# Patient Record
Sex: Female | Born: 1965 | Race: Black or African American | Hispanic: No | Smoking: Never smoker
Health system: Southern US, Community
[De-identification: ages and names within clinical notes are randomized; demographics above are authoritative.]

## PROBLEM LIST (undated history)

## (undated) ENCOUNTER — Ambulatory Visit: Payer: PRIVATE HEALTH INSURANCE

## (undated) ENCOUNTER — Encounter

## (undated) ENCOUNTER — Encounter
Attending: Student in an Organized Health Care Education/Training Program | Primary: Student in an Organized Health Care Education/Training Program

## (undated) ENCOUNTER — Ambulatory Visit

## (undated) ENCOUNTER — Ambulatory Visit: Attending: Pharmacist | Primary: Pharmacist

## (undated) ENCOUNTER — Ambulatory Visit
Payer: PRIVATE HEALTH INSURANCE | Attending: Student in an Organized Health Care Education/Training Program | Primary: Student in an Organized Health Care Education/Training Program

## (undated) ENCOUNTER — Encounter: Attending: Pharmacist | Primary: Pharmacist

## (undated) ENCOUNTER — Telehealth

## (undated) ENCOUNTER — Encounter: Attending: Ambulatory Care | Primary: Ambulatory Care

## (undated) ENCOUNTER — Ambulatory Visit
Attending: Student in an Organized Health Care Education/Training Program | Primary: Student in an Organized Health Care Education/Training Program

## (undated) ENCOUNTER — Telehealth: Attending: Ambulatory Care | Primary: Ambulatory Care

## (undated) DIAGNOSIS — H409 Unspecified glaucoma: Secondary | ICD-10-CM

## (undated) DIAGNOSIS — C50919 Malignant neoplasm of unspecified site of unspecified female breast: Secondary | ICD-10-CM

## (undated) DIAGNOSIS — R350 Frequency of micturition: Secondary | ICD-10-CM

## (undated) DIAGNOSIS — Z923 Personal history of irradiation: Secondary | ICD-10-CM

## (undated) DIAGNOSIS — N898 Other specified noninflammatory disorders of vagina: Secondary | ICD-10-CM

## (undated) DIAGNOSIS — K219 Gastro-esophageal reflux disease without esophagitis: Secondary | ICD-10-CM

## (undated) HISTORY — DX: Other specified noninflammatory disorders of vagina: N89.8

## (undated) HISTORY — DX: Frequency of micturition: R35.0

## (undated) HISTORY — PX: EYE SURGERY: SHX253

## (undated) HISTORY — DX: Unspecified glaucoma: H40.9

## (undated) HISTORY — DX: Gastro-esophageal reflux disease without esophagitis: K21.9

## (undated) HISTORY — PX: BREAST LUMPECTOMY: SHX2

---
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
## 2005-09-05 ENCOUNTER — Ambulatory Visit: Payer: Self-pay

## 2007-09-04 HISTORY — PX: VAGINAL HYSTERECTOMY: SUR661

## 2007-09-04 HISTORY — PX: LAPAROSCOPIC SUPRACERVICAL HYSTERECTOMY: SUR797

## 2007-11-12 ENCOUNTER — Ambulatory Visit: Payer: Self-pay | Admitting: Obstetrics and Gynecology

## 2007-11-24 ENCOUNTER — Ambulatory Visit: Payer: Self-pay | Admitting: Obstetrics and Gynecology

## 2008-09-03 DIAGNOSIS — Z923 Personal history of irradiation: Secondary | ICD-10-CM

## 2008-09-03 DIAGNOSIS — C50919 Malignant neoplasm of unspecified site of unspecified female breast: Secondary | ICD-10-CM

## 2008-09-03 HISTORY — PX: BREAST EXCISIONAL BIOPSY: SUR124

## 2008-09-03 HISTORY — PX: BREAST BIOPSY: SHX20

## 2008-09-03 HISTORY — DX: Malignant neoplasm of unspecified site of unspecified female breast: C50.919

## 2008-09-03 HISTORY — PX: BREAST LUMPECTOMY: SHX2

## 2008-09-03 HISTORY — DX: Personal history of irradiation: Z92.3

## 2008-12-15 ENCOUNTER — Ambulatory Visit: Payer: Self-pay | Admitting: Surgery

## 2008-12-22 ENCOUNTER — Ambulatory Visit: Payer: Self-pay | Admitting: Surgery

## 2009-05-18 ENCOUNTER — Ambulatory Visit: Payer: Self-pay | Admitting: Surgery

## 2009-06-03 ENCOUNTER — Ambulatory Visit: Payer: Self-pay | Admitting: Surgery

## 2009-06-21 ENCOUNTER — Ambulatory Visit: Payer: Self-pay | Admitting: Surgery

## 2009-07-04 ENCOUNTER — Ambulatory Visit: Payer: Self-pay | Admitting: Radiation Oncology

## 2009-07-05 ENCOUNTER — Ambulatory Visit: Payer: Self-pay | Admitting: Radiation Oncology

## 2009-08-03 ENCOUNTER — Ambulatory Visit: Payer: Self-pay | Admitting: Radiation Oncology

## 2009-09-03 ENCOUNTER — Ambulatory Visit: Payer: Self-pay | Admitting: Radiation Oncology

## 2009-10-04 ENCOUNTER — Ambulatory Visit: Payer: Self-pay | Admitting: Radiation Oncology

## 2009-11-01 ENCOUNTER — Ambulatory Visit: Payer: Self-pay | Admitting: Radiation Oncology

## 2009-12-02 ENCOUNTER — Ambulatory Visit: Payer: Self-pay | Admitting: Internal Medicine

## 2009-12-05 ENCOUNTER — Ambulatory Visit: Payer: Self-pay | Admitting: Internal Medicine

## 2009-12-06 ENCOUNTER — Ambulatory Visit: Payer: Self-pay | Admitting: Internal Medicine

## 2010-01-01 ENCOUNTER — Ambulatory Visit: Payer: Self-pay | Admitting: Internal Medicine

## 2010-03-03 ENCOUNTER — Ambulatory Visit: Payer: Self-pay | Admitting: Internal Medicine

## 2010-03-20 ENCOUNTER — Ambulatory Visit: Payer: Self-pay | Admitting: Internal Medicine

## 2010-04-03 ENCOUNTER — Ambulatory Visit: Payer: Self-pay | Admitting: Internal Medicine

## 2010-04-07 ENCOUNTER — Ambulatory Visit: Payer: Self-pay | Admitting: Internal Medicine

## 2010-05-04 ENCOUNTER — Ambulatory Visit: Payer: Self-pay | Admitting: Internal Medicine

## 2010-06-12 ENCOUNTER — Ambulatory Visit: Payer: Self-pay | Admitting: Internal Medicine

## 2010-08-08 ENCOUNTER — Ambulatory Visit: Payer: Self-pay | Admitting: Internal Medicine

## 2010-09-03 ENCOUNTER — Ambulatory Visit: Payer: Self-pay | Admitting: Internal Medicine

## 2010-10-04 ENCOUNTER — Ambulatory Visit: Payer: Self-pay | Admitting: Internal Medicine

## 2010-11-17 DIAGNOSIS — H43819 Vitreous degeneration, unspecified eye: Secondary | ICD-10-CM | POA: Insufficient documentation

## 2010-11-17 DIAGNOSIS — H40049 Steroid responder, unspecified eye: Secondary | ICD-10-CM | POA: Insufficient documentation

## 2010-11-17 DIAGNOSIS — H35069 Retinal vasculitis, unspecified eye: Secondary | ICD-10-CM | POA: Insufficient documentation

## 2010-11-17 DIAGNOSIS — H3090 Unspecified chorioretinal inflammation, unspecified eye: Secondary | ICD-10-CM | POA: Insufficient documentation

## 2010-11-17 DIAGNOSIS — H2510 Age-related nuclear cataract, unspecified eye: Secondary | ICD-10-CM | POA: Insufficient documentation

## 2010-12-12 ENCOUNTER — Ambulatory Visit: Payer: Self-pay | Admitting: Internal Medicine

## 2011-01-02 ENCOUNTER — Ambulatory Visit: Payer: Self-pay | Admitting: Internal Medicine

## 2011-02-14 ENCOUNTER — Emergency Department: Payer: Self-pay

## 2011-06-13 ENCOUNTER — Ambulatory Visit: Payer: Self-pay | Admitting: Internal Medicine

## 2011-06-19 ENCOUNTER — Ambulatory Visit: Payer: Self-pay | Admitting: Internal Medicine

## 2011-07-05 ENCOUNTER — Ambulatory Visit: Payer: Self-pay | Admitting: Internal Medicine

## 2011-09-24 ENCOUNTER — Ambulatory Visit: Payer: Self-pay | Admitting: Internal Medicine

## 2011-10-05 ENCOUNTER — Ambulatory Visit: Payer: Self-pay | Admitting: Internal Medicine

## 2011-12-12 ENCOUNTER — Ambulatory Visit: Payer: Self-pay | Admitting: Internal Medicine

## 2012-01-02 ENCOUNTER — Ambulatory Visit: Payer: Self-pay | Admitting: Internal Medicine

## 2012-06-19 ENCOUNTER — Ambulatory Visit: Payer: Self-pay | Admitting: Internal Medicine

## 2012-07-25 ENCOUNTER — Ambulatory Visit: Payer: Self-pay | Admitting: Internal Medicine

## 2012-08-03 ENCOUNTER — Ambulatory Visit: Payer: Self-pay | Admitting: Internal Medicine

## 2012-09-03 ENCOUNTER — Ambulatory Visit: Payer: Self-pay | Admitting: Radiation Oncology

## 2012-10-04 ENCOUNTER — Ambulatory Visit: Payer: Self-pay | Admitting: Radiation Oncology

## 2013-06-22 ENCOUNTER — Ambulatory Visit: Payer: Self-pay | Admitting: Internal Medicine

## 2013-07-22 ENCOUNTER — Ambulatory Visit: Payer: Self-pay | Admitting: Internal Medicine

## 2013-08-03 ENCOUNTER — Ambulatory Visit: Payer: Self-pay | Admitting: Internal Medicine

## 2013-09-29 ENCOUNTER — Ambulatory Visit: Payer: Self-pay | Admitting: Internal Medicine

## 2013-10-04 ENCOUNTER — Ambulatory Visit: Payer: Self-pay | Admitting: Internal Medicine

## 2013-11-16 LAB — HM PAP SMEAR: HM PAP: NEGATIVE

## 2014-06-23 ENCOUNTER — Ambulatory Visit: Payer: Self-pay | Admitting: Internal Medicine

## 2014-09-29 ENCOUNTER — Ambulatory Visit: Payer: Self-pay | Admitting: Internal Medicine

## 2014-10-11 ENCOUNTER — Ambulatory Visit: Payer: Self-pay | Admitting: Internal Medicine

## 2014-11-02 ENCOUNTER — Ambulatory Visit
Admit: 2014-11-02 | Disposition: A | Discharge status: Home / Self Care | Payer: Self-pay | Attending: Internal Medicine | Admitting: Internal Medicine

## 2014-12-17 ENCOUNTER — Other Ambulatory Visit: Payer: Self-pay | Admitting: Internal Medicine

## 2014-12-17 DIAGNOSIS — C50919 Malignant neoplasm of unspecified site of unspecified female breast: Secondary | ICD-10-CM

## 2014-12-17 DIAGNOSIS — C50911 Malignant neoplasm of unspecified site of right female breast: Secondary | ICD-10-CM

## 2015-06-27 ENCOUNTER — Other Ambulatory Visit: Payer: Self-pay

## 2015-06-28 ENCOUNTER — Encounter (INDEPENDENT_AMBULATORY_CARE_PROVIDER_SITE_OTHER): Payer: Self-pay

## 2015-06-28 ENCOUNTER — Ambulatory Visit: Payer: 59

## 2015-06-28 ENCOUNTER — Ambulatory Visit
Admission: RE | Admit: 2015-06-28 | Discharge: 2015-06-28 | Disposition: A | Discharge status: Home / Self Care | Payer: 59 | Source: Ambulatory Visit | Attending: Internal Medicine | Admitting: Internal Medicine

## 2015-06-28 DIAGNOSIS — Z9889 Other specified postprocedural states: Secondary | ICD-10-CM | POA: Diagnosis not present

## 2015-06-28 DIAGNOSIS — C50919 Malignant neoplasm of unspecified site of unspecified female breast: Secondary | ICD-10-CM

## 2015-06-28 DIAGNOSIS — Z923 Personal history of irradiation: Secondary | ICD-10-CM | POA: Diagnosis not present

## 2015-06-28 DIAGNOSIS — Z853 Personal history of malignant neoplasm of breast: Secondary | ICD-10-CM | POA: Insufficient documentation

## 2015-06-28 HISTORY — DX: Malignant neoplasm of unspecified site of unspecified female breast: C50.919

## 2016-01-18 ENCOUNTER — Encounter: Payer: Self-pay | Admitting: Obstetrics and Gynecology

## 2016-02-16 ENCOUNTER — Encounter: Payer: Self-pay | Admitting: Obstetrics and Gynecology

## 2016-03-07 ENCOUNTER — Encounter: Payer: Self-pay | Admitting: Obstetrics and Gynecology

## 2016-04-10 ENCOUNTER — Encounter: Payer: Self-pay | Admitting: Obstetrics and Gynecology

## 2016-04-12 NOTE — Progress Notes (Signed)
ANNUAL PREVENTATIVE CARE GYN  ENCOUNTER NOTE  Subjective:       Victoria Greene is a 50 y.o. G61P2001 female here for a routine annual gynecologic exam.  Current complaints: 1.  None  Patient is not taking Evista. She is taking calcium with vitamin D Patient reports normal bowel and bladder function.   Gynecologic History No LMP recorded. Contraception: status post hysterectomy Last Pap: 11/16/2013 neg/neg. Results were: normal Last mammogram: 06/2015 birad 2. Results were: normal  Obstetric History OB History  Gravida Para Term Preterm AB Living  2 2 2     1   SAB TAB Ectopic Multiple Live Births          1    # Outcome Date GA Lbr Len/2nd Weight Sex Delivery Anes PTL Lv  2 Term 1998   9 lb (4.082 kg) M CS-LTranv   LIV  1 Term 1991   7 lb 2.6 oz (3.248 kg) M CS-LTranv         Past Medical History:  Diagnosis Date  . Breast cancer ([place]) 2010   Right breast cancer - radiation  . Glaucoma   . Urinary frequency   . Vaginal discharge     Past Surgical History:  Procedure Laterality Date  . BREAST BIOPSY Left 2010   stereotactic  . BREAST LUMPECTOMY Right 2010  . BREAST LUMPECTOMY    . CESAREAN SECTION    . VAGINAL HYSTERECTOMY  2009   lsh    No current outpatient prescriptions on file prior to visit.   No current facility-administered medications on file prior to visit.     Allergies  Allergen Reactions  . Shellfish-Derived Products Rash    Social History   Social History  . Marital status: Divorced    Spouse name: N/A  . Number of children: N/A  . Years of education: N/A   Occupational History  . Not on file.   Social History Main Topics  . Smoking status: Never Smoker  . Smokeless tobacco: Not on file  . Alcohol use Yes     Comment: occas  . Drug use: No  . Sexual activity: Yes    Birth control/ protection: Condom   Other Topics Concern  . Not on file   Social History Narrative  . No narrative on file    Family History  Problem Relation  Age of Onset  . Breast cancer Maternal Aunt 38  . Breast cancer Maternal Aunt 88  . Heart disease Father   . Ovarian cancer Neg Hx   . Colon cancer Neg Hx   . Diabetes Neg Hx     The following portions of the patient's history were reviewed and updated as appropriate: allergies, current medications, past family history, past medical history, past social history, past surgical history and problem list.  Review of Systems ROS Review of Systems - General ROS: negative for - chills, fatigue, fever, hot flashes, night sweats, weight gain or weight loss Psychological ROS: negative for - anxiety, decreased libido, depression, mood swings, physical abuse or sexual abuse Ophthalmic ROS: negative for - blurry vision, eye pain or loss of vision ENT ROS: negative for - headaches, hearing change, visual changes or vocal changes Allergy and Immunology ROS: negative for - hives, itchy/watery eyes or seasonal allergies Hematological and Lymphatic ROS: negative for - bleeding problems, bruising, swollen lymph nodes or weight loss Endocrine ROS: negative for - galactorrhea, hair pattern changes, hot flashes, malaise/lethargy, mood swings, palpitations, polydipsia/polyuria, skin changes, temperature intolerance  or unexpected weight changes Breast ROS: negative for - new or changing breast lumps or nipple discharge Respiratory ROS: negative for - cough or shortness of breath Cardiovascular ROS: negative for - chest pain, irregular heartbeat, palpitations or shortness of breath Gastrointestinal ROS: no abdominal pain, change in bowel habits, or black or bloody stools Genito-Urinary ROS: no dysuria, trouble voiding, or hematuria Musculoskeletal ROS: negative for - joint pain or joint stiffness Neurological ROS: negative for - bowel and bladder control changes Dermatological ROS: negative for rash and skin lesion changes   Objective:   There were no vitals taken for this visit. CONSTITUTIONAL:  Well-developed, well-nourished female in no acute distress.  PSYCHIATRIC: Normal mood and affect. Normal behavior. Normal judgment and thought content. [place]: Alert and oriented to person, place, and time. Normal muscle tone coordination. No cranial nerve deficit noted. HENT:  Normocephalic, atraumatic, External right and left ear normal. Oropharynx is clear and moist EYES: Conjunctivae and EOM are normal. Pupils are equal, round, and reactive to light. No scleral icterus.  NECK: Normal range of motion, supple, no masses.  Normal thyroid.  SKIN: Skin is warm and dry. No rash noted. Not diaphoretic. No erythema. No pallor. CARDIOVASCULAR: Normal heart rate noted, regular rhythm, no murmur. RESPIRATORY: Clear to auscultation bilaterally. Effort and breath sounds normal, no problems with respiration noted. BREASTS:  Right-scarring in the lower outer quadrant of the right areola from excisional biopsy of breast cancer; no palpable mass or significant tenderness; no adenopathy; no nipple discharge Left-curvilinear scar left outer quadrants approximately 5 cm well-healed (breast biopsy-microcalcifications); no palpable mass or significant tenderness; no adenopathy; no nipple discharge ABDOMEN: Soft, normal bowel sounds, no distention noted.  No tenderness, rebound or guarding. Midline incision well-healed without evidence of hernia BLADDER: Normal PELVIC:  External Genitalia: Normal  BUS: Normal  Vagina: Normal  Cervix: Normal; no cervical motion tenderness  Uterus: Surgically absent  Adnexa: Normal  RV: External Exam NormaI, No Rectal Masses and Normal Sphincter tone  MUSCULOSKELETAL: Normal range of motion. No tenderness.  No cyanosis, clubbing, or edema.  2+ distal pulses. LYMPHATIC: No Axillary, Supraclavicular, or Inguinal Adenopathy.    Assessment:   Annual gynecologic examination 50 y.o. Contraception: status post hysterectomy LSH Normal BMI-28 Problem List Items Addressed This  Visit    None    Visit Diagnoses    Well woman exam with routine gynecological exam    -  Primary   Screening for breast cancer       S/P hysterectomy       History of breast cancer          Plan:  Pap: due 11/2016 Mammogram: Ordered- needs dx mammo 06/2016 Stool Guaiac Testing:  Not Indicated Labs: most labs thru employer wants tsh Routine preventative health maintenance measures emphasized: Exercise/Diet/Weight control, Tobacco Warnings and Alcohol/Substance use risks  Continue with calcium and vitamin D supplementation Return to [place], CMA  Brayton Mars, MD  Note: This dictation was prepared with Dragon dictation along with smaller phrase technology. Any transcriptional errors that result from this process are unintentional.

## 2016-04-17 ENCOUNTER — Ambulatory Visit (INDEPENDENT_AMBULATORY_CARE_PROVIDER_SITE_OTHER): Payer: 59 | Admitting: Obstetrics and Gynecology

## 2016-04-17 ENCOUNTER — Encounter: Payer: Self-pay | Admitting: Obstetrics and Gynecology

## 2016-04-17 VITALS — BP 120/73 | HR 75 | Ht 60.0 in | Wt 146.9 lb

## 2016-04-17 DIAGNOSIS — E663 Overweight: Secondary | ICD-10-CM | POA: Insufficient documentation

## 2016-04-17 DIAGNOSIS — Z90710 Acquired absence of both cervix and uterus: Secondary | ICD-10-CM | POA: Diagnosis not present

## 2016-04-17 DIAGNOSIS — Z853 Personal history of malignant neoplasm of breast: Secondary | ICD-10-CM | POA: Diagnosis not present

## 2016-04-17 DIAGNOSIS — Z01419 Encounter for gynecological examination (general) (routine) without abnormal findings: Secondary | ICD-10-CM

## 2016-04-17 DIAGNOSIS — R5383 Other fatigue: Secondary | ICD-10-CM | POA: Diagnosis not present

## 2016-04-17 NOTE — Patient Instructions (Signed)
1. No Pap smear today. 2. Mammogram ordered 3. Encouraged self breast exam monthly 4. Continue with calcium and vitamin D supplementation 5. Continue with healthy eating and exercise with a weight loss goal of 10 pounds over 1 year 6. Return in 1 year for annual exam

## 2016-04-18 LAB — TSH: TSH: 1.44 u[IU]/mL (ref 0.450–4.500)

## 2016-06-28 ENCOUNTER — Ambulatory Visit
Admission: RE | Admit: 2016-06-28 | Discharge: 2016-06-28 | Disposition: A | Discharge status: Home / Self Care | Payer: 59 | Source: Ambulatory Visit | Attending: Obstetrics and Gynecology | Admitting: Obstetrics and Gynecology

## 2016-06-28 ENCOUNTER — Other Ambulatory Visit: Payer: Self-pay | Admitting: Obstetrics and Gynecology

## 2016-06-28 DIAGNOSIS — Z853 Personal history of malignant neoplasm of breast: Secondary | ICD-10-CM

## 2016-06-28 HISTORY — DX: Personal history of irradiation: Z92.3

## 2016-09-14 DIAGNOSIS — H35351 Cystoid macular degeneration, right eye: Secondary | ICD-10-CM | POA: Diagnosis not present

## 2016-09-14 DIAGNOSIS — H3091 Unspecified chorioretinal inflammation, right eye: Secondary | ICD-10-CM | POA: Diagnosis not present

## 2017-01-16 DIAGNOSIS — H409 Unspecified glaucoma: Secondary | ICD-10-CM | POA: Diagnosis not present

## 2017-01-16 DIAGNOSIS — H35351 Cystoid macular degeneration, right eye: Secondary | ICD-10-CM | POA: Diagnosis not present

## 2017-01-16 DIAGNOSIS — H3552 Pigmentary retinal dystrophy: Secondary | ICD-10-CM | POA: Diagnosis not present

## 2017-03-19 ENCOUNTER — Telehealth: Payer: Self-pay | Admitting: Obstetrics and Gynecology

## 2017-03-19 NOTE — Telephone Encounter (Signed)
Patient called stating we were supposed to schedule her for a mammogram, however she stated that she was released from the cancer center and only needs a screening mammogram in which case she could schedule it herself. Do you know what she needs?

## 2017-03-19 NOTE — Telephone Encounter (Signed)
Pt aware mammo is due 06/2017.

## 2017-03-19 NOTE — Telephone Encounter (Signed)
LMTRC

## 2017-03-20 DIAGNOSIS — J069 Acute upper respiratory infection, unspecified: Secondary | ICD-10-CM | POA: Diagnosis not present

## 2017-03-20 DIAGNOSIS — R05 Cough: Secondary | ICD-10-CM | POA: Diagnosis not present

## 2017-04-22 ENCOUNTER — Encounter: Payer: Self-pay | Admitting: Medical Oncology

## 2017-04-22 ENCOUNTER — Emergency Department
Admission: EM | Admit: 2017-04-22 | Discharge: 2017-04-22 | Disposition: A | Discharge status: Home / Self Care | Payer: 59 | Attending: Emergency Medicine | Admitting: Emergency Medicine

## 2017-04-22 ENCOUNTER — Emergency Department: Payer: 59

## 2017-04-22 DIAGNOSIS — Z79899 Other long term (current) drug therapy: Secondary | ICD-10-CM | POA: Diagnosis not present

## 2017-04-22 DIAGNOSIS — R05 Cough: Secondary | ICD-10-CM | POA: Diagnosis not present

## 2017-04-22 DIAGNOSIS — K219 Gastro-esophageal reflux disease without esophagitis: Secondary | ICD-10-CM | POA: Diagnosis not present

## 2017-04-22 MED ORDER — OMEPRAZOLE 20 MG PO CPDR: 20.0000 mg | DELAYED_RELEASE_CAPSULE | Freq: Every day | ORAL | 1 refills | Status: DC

## 2017-04-22 NOTE — ED Triage Notes (Signed)
Pt reports since June she has been having a cough ear congestion. Pt reports "tightness" in chest from coughing. Pt reports 3 weeks ago she was put on zpack without relief. Pt in NAD. EKG NSR.

## 2017-04-22 NOTE — ED Provider Notes (Signed)
[facility] Emergency Department Provider Note  ____________________________________________  Time seen: Approximately 7:26 PM  I have reviewed the triage vital signs and the nursing notes.   HISTORY  Chief Complaint Cough    HPI Victoria Greene is a 51 y.o. female presenting to the emergency Department with nonproductive cough for approximately 2 months. Patient states that  approximately 1 month ago, she was seen at urgent care and prescribed azithromycin and placed on prednisone. Patient states that her nonproductive cough has persisted. Cough is especially worse in the morning after the consumption of coffee and at night when patient is in a supine position. She states that she often has to take Tums during the day for heartburn. Patient denies fatigue, purulent sputum production or shortness of breath. She has been afebrile. She denies myalgias and weight loss or weight gain. Patient states that she has been participating in [employer] and has been consuming dark chocolate and spaghetti with red sauce often.   Past Medical History:  Diagnosis Date  . Breast cancer ([place]) 2010   Right breast cancer - radiation  . Glaucoma   . Personal history of radiation therapy   . Urinary frequency   . Vaginal discharge     Patient Active Problem List   Diagnosis Date Noted  . S/P hysterectomy 04/17/2016  . History of breast cancer 04/17/2016  . Other fatigue 04/17/2016  . Overweight (BMI 25.0-29.9) 04/17/2016    Past Surgical History:  Procedure Laterality Date  . BREAST BIOPSY Left 2010   stereotactic  . BREAST EXCISIONAL BIOPSY Right 2010   +  . BREAST LUMPECTOMY Right 2010  . BREAST LUMPECTOMY    . CESAREAN SECTION    . VAGINAL HYSTERECTOMY  2009   lsh    Prior to Admission medications   Medication Sig Start Date End Date Taking? Authorizing Provider  cyanocobalamin 1000 MCG tablet Take 1,000 mcg by mouth daily.    [provider]   Multiple Vitamin (MULTIVITAMIN) capsule Take 1 capsule by mouth daily.    [provider]  omeprazole (PRILOSEC) 20 MG capsule Take 1 capsule (20 mg total) by mouth daily. Take 1 tablet by mouth for the next 4 weeks with food. 04/22/17 05/20/17  Lannie Fields, PA-C    Allergies Shellfish-derived products  Family History  Problem Relation Age of Onset  . Breast cancer Maternal Aunt 38  . Breast cancer Maternal Aunt 21  . Heart disease Father   . Thyroid disease Mother   . Ovarian cancer Neg Hx   . Colon cancer Neg Hx   . Diabetes Neg Hx     Social History Social History  Substance Use Topics  . Smoking status: Never Smoker  . Smokeless tobacco: Not on file  . Alcohol use Yes     Comment: occas     Review of Systems  Constitutional: No fever/chills Eyes: No visual changes. No discharge ENT: No upper respiratory complaints. Cardiovascular: no chest pain. Respiratory: She has dry, nonproductive cough. Gastrointestinal: No abdominal pain.  No nausea, no vomiting.  No diarrhea.  No constipation. Musculoskeletal: Negative for musculoskeletal pain. Skin: Negative for rash, abrasions, lacerations, ecchymosis. Neurological: Negative for headaches, focal weakness or numbness.  ____________________________________________   PHYSICAL EXAM:  VITAL SIGNS: ED Triage Vitals  Enc Vitals Group     BP 04/22/17 1720 (!) 143/79     Pulse Rate 04/22/17 1720 65     Resp 04/22/17 1720 18     Temp  04/22/17 1720 98.4 F (36.9 C)     Temp Source 04/22/17 1720 Oral     SpO2 04/22/17 1720 100 %     Weight 04/22/17 1719 147 lb (66.7 kg)     Height 04/22/17 1719 5\' 1"  (1.549 m)     Head Circumference --      Peak Flow --      Pain Score 04/22/17 1910 0     Pain Loc --      Pain Edu? --      Excl. in [place]? --      Constitutional: Alert and oriented. Well appearing and in no acute distress. Eyes: Conjunctivae are normal. PERRL. EOMI. Head: Atraumatic. ENT:      Ears:  Tympanic membranes are effused bilaterally.      Nose: No congestion/rhinnorhea.      Mouth/Throat: Mucous membranes are moist.  Neck: Full range of motion. Cardiovascular: Normal rate, regular rhythm. Normal S1 and S2.  Good peripheral circulation. Respiratory: Normal respiratory effort without tachypnea or retractions. Lungs CTAB. Good air entry to the bases with no decreased or absent breath sounds. Skin:  Skin is warm, dry and intact. No rash noted. Psychiatric: Mood and affect are normal. Speech and behavior are normal. Patient exhibits appropriate insight and judgement.   ____________________________________________   LABS (all labs ordered are listed, but only abnormal results are displayed)  Labs Reviewed - No data to display ____________________________________________  EKG  Normal sinus rhythm without ST segment elevation ____________________________________________  RADIOLOGY I, Lannie Fields, personally viewed and evaluated these images (plain radiographs) as part of my medical decision making, as well as reviewing the written report by the radiologist.  Dg Chest 2 View  Result Date: 04/22/2017 CLINICAL DATA:  Cough. EXAM: CHEST  2 VIEW COMPARISON:  None. FINDINGS: The heart size and mediastinal contours are within normal limits. Both lungs are clear. No pneumothorax or pleural effusion is noted. The visualized skeletal structures are unremarkable. IMPRESSION: No active cardiopulmonary disease. Electronically Signed   By: Marijo Conception, M.D.   On: 04/22/2017 18:17    ____________________________________________    PROCEDURES  Procedure(s) performed:    Procedures    Medications - No data to display   ____________________________________________   INITIAL IMPRESSION / ASSESSMENT AND PLAN / ED COURSE  Pertinent labs & imaging results that were available during my care of the patient were reviewed by me and considered in my medical decision making (see  chart for details).  Review of the [place] CSRS was performed in accordance of the [place] prior to dispensing any controlled drugs.     Assessment and plan GERD Patient presents to the emergency department with nonproductive cough for approximately 2 months. Patient's cough has been dry in nature and is not associated with fatigue, purulent sputum production or dyspnea. Patient has a history of GERD and takes Tums multiple times throughout the day. DG chest reveals no consolidations or findings consistent with pneumonia. Patient's history is consistent with GERD. Patient was started on omeprazole and advised to follow-up with primary care. ____________________________________________  FINAL CLINICAL IMPRESSION(S) / ED DIAGNOSES  Final diagnoses:  Gastroesophageal reflux disease without esophagitis      NEW MEDICATIONS STARTED DURING THIS VISIT:  Discharge Medication List as of 04/22/2017  6:54 PM    START taking these medications   Details  omeprazole (PRILOSEC) 20 MG capsule Take 1 capsule (20 mg total) by mouth daily. Take 1 tablet by mouth for the next 4 weeks with  food., Starting Mon 04/22/2017, Until Mon 05/20/2017, Print            This chart was dictated using voice recognition software/Dragon. Despite best efforts to proofread, errors can occur which can change the meaning. Any change was purely unintentional.    Lannie Fields, PA-C 04/22/17 1932    Lannie Fields, PA-C 04/22/17 1932    Carrie Mew, MD 04/22/17 [phone]

## 2017-04-22 NOTE — ED Notes (Signed)
See triage note  States she developed cough and ear congestion about 2 months ago   Also having some tightness to chest. Was seen at urgent care  Placed on z-pak and allergy meds    Was not any better so placed on steroids  But states she does not feel any better  Afebrile on arrival

## 2017-04-22 NOTE — Progress Notes (Signed)
ANNUAL PREVENTATIVE CARE GYN  ENCOUNTER NOTE  Subjective:       Victoria Greene is a 51 y.o. G54P2001 female here for a routine annual gynecologic exam.  Current complaints: 1.  None  Recently diagnosed with GERD. On omeprazole with control of symptoms. Bowel function is normal. Bladder function normal. Patient has ongoing issues with eczema.     Gynecologic History No LMP recorded. Contraception: status post hysterectomy Last Pap: 11/16/2013 neg/neg. Results were: normal Last mammogram: 06/2016 birad 2. Results were: normal  Obstetric History OB History  Gravida Para Term Preterm AB Living  2 2 2     1   SAB TAB Ectopic Multiple Live Births          1    # Outcome Date GA Lbr Len/2nd Weight Sex Delivery Anes PTL Lv  2 Term 1998   9 lb (4.082 kg) M CS-LTranv   LIV  1 Term 1991   7 lb 2.6 oz (3.248 kg) M CS-LTranv         Past Medical History:  Diagnosis Date  . Breast cancer ([place]) 2010   Right breast cancer - radiation  . Glaucoma   . Personal history of radiation therapy   . Urinary frequency   . Vaginal discharge     Past Surgical History:  Procedure Laterality Date  . BREAST BIOPSY Left 2010   stereotactic  . BREAST EXCISIONAL BIOPSY Right 2010   +  . BREAST LUMPECTOMY Right 2010  . BREAST LUMPECTOMY    . CESAREAN SECTION    . VAGINAL HYSTERECTOMY  2009   lsh    Current Outpatient Prescriptions on File Prior to Visit  Medication Sig Dispense Refill  . cyanocobalamin 1000 MCG tablet Take 1,000 mcg by mouth daily.    . Multiple Vitamin (MULTIVITAMIN) capsule Take 1 capsule by mouth daily.     No current facility-administered medications on file prior to visit.     Allergies  Allergen Reactions  . Shellfish-Derived Products Rash    Social History   Social History  . Marital status: Divorced    Spouse name: N/A  . Number of children: N/A  . Years of education: N/A   Occupational History  . Not on file.   Social History Main Topics  . Smoking  status: Never Smoker  . Smokeless tobacco: Not on file  . Alcohol use Yes     Comment: occas  . Drug use: No  . Sexual activity: Not Currently    Birth control/ protection: None   Other Topics Concern  . Not on file   Social History Narrative  . No narrative on file    Family History  Problem Relation Age of Onset  . Breast cancer Maternal Aunt 38  . Breast cancer Maternal Aunt 27  . Heart disease Father   . Thyroid disease Mother   . Ovarian cancer Neg Hx   . Colon cancer Neg Hx   . Diabetes Neg Hx     The following portions of the patient's history were reviewed and updated as appropriate: allergies, current medications, past family history, past medical history, past social history, past surgical history and problem list.  Review of Systems Review of Systems  Constitutional: Negative.   HENT: Negative.   Eyes: Negative.   Respiratory: Negative.   Cardiovascular: Negative.   Gastrointestinal:       Recently diagnosed with GERD  Genitourinary: Negative.   Musculoskeletal: Negative.   Skin:  History of eczema with skin lesions on abdomen and thighs  Neurological: Negative.   Endo/Heme/Allergies: Negative.   Psychiatric/Behavioral: Negative.      Objective:  BP 123/73   Pulse 70   Ht 5\' 1"  (1.549 m)   Wt 151 lb 9.6 oz (68.8 kg)   BMI 28.64 kg/m  CONSTITUTIONAL: Well-developed, well-nourished female in no acute distress.  PSYCHIATRIC: Normal mood and affect. Normal behavior. Normal judgment and thought content. [place]: Alert and oriented to person, place, and time. Normal muscle tone coordination. No cranial nerve deficit noted. HENT:  Normocephalic, atraumatic, External right and left ear normal. Oropharynx is clear and moist EYES: Conjunctivae and EOM are normal.No scleral icterus.  NECK: Normal range of motion, supple, no masses.  Normal thyroid.  SKIN: Skin is warm and dry. No rash noted. Not diaphoretic. No erythema. No pallor. CARDIOVASCULAR:  Normal heart rate noted, regular rhythm, no murmur. RESPIRATORY: Clear to auscultation bilaterally. Effort and breath sounds normal, no problems with respiration noted. BREASTS:  Right-scarring in the lower outer quadrant of the right areola from excisional biopsy of breast cancer; no palpable mass or significant tenderness; no adenopathy; no nipple discharge Left-curvilinear scar left outer quadrants approximately 5 cm well-healed (breast biopsy-microcalcifications); no palpable mass or significant tenderness; no adenopathy; no nipple discharge ABDOMEN: Soft, normal bowel sounds, no distention noted.  No tenderness, rebound or guarding. Midline incision well-healed without evidence of hernia BLADDER: Normal PELVIC:  External Genitalia: Normal  BUS: Normal  Vagina: Normal  Cervix: Normal; no cervical motion tenderness  Uterus: Surgically absent  Adnexa: Normal  RV: External Exam NormaI, No Rectal Masses and Normal Sphincter tone  MUSCULOSKELETAL: Normal range of motion. No tenderness.  No cyanosis, clubbing, or edema.  2+ distal pulses. LYMPHATIC: No Axillary, Supraclavicular, or Inguinal Adenopathy.    Assessment:   Annual gynecologic examination 51 y.o. Contraception: status post hysterectomy LSH Normal BMI-28 Problem List Items Addressed This Visit    S/P hysterectomy   History of breast cancer    Other Visit Diagnoses    Well woman exam with routine gynecological exam    -  Primary      Plan:  Pap: pap w/hpv  Mammogram: Ordered- Stool Guaiac Testing:  Not Indicated Labs: thru employer Routine preventative health maintenance measures emphasized: Exercise/Diet/Weight control, Tobacco Warnings and Alcohol/Substance use risks  Continue with calcium and vitamin D supplementation Return to [place], CMA  Brayton Mars, MD   Note: This dictation was prepared with Dragon dictation along with smaller phrase technology. Any transcriptional  errors that result from this process are unintentional.

## 2017-04-23 ENCOUNTER — Encounter: Payer: Self-pay | Admitting: Obstetrics and Gynecology

## 2017-04-23 ENCOUNTER — Ambulatory Visit (INDEPENDENT_AMBULATORY_CARE_PROVIDER_SITE_OTHER): Payer: 59 | Admitting: Obstetrics and Gynecology

## 2017-04-23 VITALS — BP 123/73 | HR 70 | Ht 61.0 in | Wt 151.6 lb

## 2017-04-23 DIAGNOSIS — Z90710 Acquired absence of both cervix and uterus: Secondary | ICD-10-CM | POA: Diagnosis not present

## 2017-04-23 DIAGNOSIS — E663 Overweight: Secondary | ICD-10-CM

## 2017-04-23 DIAGNOSIS — Z01419 Encounter for gynecological examination (general) (routine) without abnormal findings: Secondary | ICD-10-CM

## 2017-04-23 DIAGNOSIS — Z853 Personal history of malignant neoplasm of breast: Secondary | ICD-10-CM

## 2017-04-23 NOTE — Patient Instructions (Signed)
Health Maintenance for Postmenopausal Women Menopause is a normal process in which your reproductive ability comes to an end. This process happens gradually over a span of months to years, usually between the ages of 22 and 9. Menopause is complete when you have missed 12 consecutive menstrual periods. It is important to talk with your health care provider about some of the most common conditions that affect postmenopausal women, such as heart disease, cancer, and bone loss (osteoporosis). Adopting a healthy lifestyle and getting preventive care can help to promote your health and wellness. Those actions can also lower your chances of developing some of these common conditions. What should I know about menopause? During menopause, you may experience a number of symptoms, such as:  Moderate-to-severe hot flashes.  Night sweats.  Decrease in sex drive.  Mood swings.  Headaches.  Tiredness.  Irritability.  Memory problems.  Insomnia.  Choosing to treat or not to treat menopausal changes is an individual decision that you make with your health care provider. What should I know about hormone replacement therapy and supplements? Hormone therapy products are effective for treating symptoms that are associated with menopause, such as hot flashes and night sweats. Hormone replacement carries certain risks, especially as you become older. If you are thinking about using estrogen or estrogen with progestin treatments, discuss the benefits and risks with your health care provider. What should I know about heart disease and stroke? Heart disease, heart attack, and stroke become more likely as you age. This may be due, in part, to the hormonal changes that your body experiences during menopause. These can affect how your body processes dietary fats, triglycerides, and cholesterol. Heart attack and stroke are both medical emergencies. There are many things that you can do to help prevent heart disease  and stroke:  Have your blood pressure checked at least every 1-2 years. High blood pressure causes heart disease and increases the risk of stroke.  If you are 53-22 years old, ask your health care provider if you should take aspirin to prevent a heart attack or a stroke.  Do not use any tobacco products, including cigarettes, chewing tobacco, or electronic cigarettes. If you need help quitting, ask your health care provider.  It is important to eat a healthy diet and maintain a healthy weight. ? Be sure to include plenty of vegetables, fruits, low-fat dairy products, and lean protein. ? Avoid eating foods that are high in solid fats, added sugars, or salt (sodium).  Get regular exercise. This is one of the most important things that you can do for your health. ? Try to exercise for at least 150 minutes each week. The type of exercise that you do should increase your heart rate and make you sweat. This is known as moderate-intensity exercise. ? Try to do strengthening exercises at least twice each week. Do these in addition to the moderate-intensity exercise.  Know your numbers.Ask your health care provider to check your cholesterol and your blood glucose. Continue to have your blood tested as directed by your health care provider.  What should I know about cancer screening? There are several types of cancer. Take the following steps to reduce your risk and to catch any cancer development as early as possible. Breast Cancer  Practice breast self-awareness. ? This means understanding how your breasts normally appear and feel. ? It also means doing regular breast self-exams. Let your health care provider know about any changes, no matter how small.  If you are 40  or older, have a clinician do a breast exam (clinical breast exam or CBE) every year. Depending on your age, family history, and medical history, it may be recommended that you also have a yearly breast X-ray (mammogram).  If you  have a family history of breast cancer, talk with your health care provider about genetic screening.  If you are at high risk for breast cancer, talk with your health care provider about having an MRI and a mammogram every year.  Breast cancer (BRCA) gene test is recommended for women who have family members with BRCA-related cancers. Results of the assessment will determine the need for genetic counseling and BRCA1 and for BRCA2 testing. BRCA-related cancers include these types: ? Breast. This occurs in males or females. ? Ovarian. ? Tubal. This may also be called fallopian tube cancer. ? Cancer of the abdominal or pelvic lining (peritoneal cancer). ? Prostate. ? Pancreatic.  Cervical, Uterine, and Ovarian Cancer Your health care provider may recommend that you be screened regularly for cancer of the pelvic organs. These include your ovaries, uterus, and vagina. This screening involves a pelvic exam, which includes checking for microscopic changes to the surface of your cervix (Pap test).  For women ages 21-65, health care providers may recommend a pelvic exam and a Pap test every three years. For women ages 79-65, they may recommend the Pap test and pelvic exam, combined with testing for human papilloma virus (HPV), every five years. Some types of HPV increase your risk of cervical cancer. Testing for HPV may also be done on women of any age who have unclear Pap test results.  Other health care providers may not recommend any screening for nonpregnant women who are considered low risk for pelvic cancer and have no symptoms. Ask your health care provider if a screening pelvic exam is right for you.  If you have had past treatment for cervical cancer or a condition that could lead to cancer, you need Pap tests and screening for cancer for at least 20 years after your treatment. If Pap tests have been discontinued for you, your risk factors (such as having a new sexual partner) need to be  reassessed to determine if you should start having screenings again. Some women have medical problems that increase the chance of getting cervical cancer. In these cases, your health care provider may recommend that you have screening and Pap tests more often.  If you have a family history of uterine cancer or ovarian cancer, talk with your health care provider about genetic screening.  If you have vaginal bleeding after reaching menopause, tell your health care provider.  There are currently no reliable tests available to screen for ovarian cancer.  Lung Cancer Lung cancer screening is recommended for adults 69-62 years old who are at high risk for lung cancer because of a history of smoking. A yearly low-dose CT scan of the lungs is recommended if you:  Currently smoke.  Have a history of at least 30 pack-years of smoking and you currently smoke or have quit within the past 15 years. A pack-year is smoking an average of one pack of cigarettes per day for one year.  Yearly screening should:  Continue until it has been 15 years since you quit.  Stop if you develop a health problem that would prevent you from having lung cancer treatment.  Colorectal Cancer  This type of cancer can be detected and can often be prevented.  Routine colorectal cancer screening usually begins at  age 42 and continues through age 45.  If you have risk factors for colon cancer, your health care provider may recommend that you be screened at an earlier age.  If you have a family history of colorectal cancer, talk with your health care provider about genetic screening.  Your health care provider may also recommend using home test kits to check for hidden blood in your stool.  A small camera at the end of a tube can be used to examine your colon directly (sigmoidoscopy or colonoscopy). This is done to check for the earliest forms of colorectal cancer.  Direct examination of the colon should be repeated every  5-10 years until age 71. However, if early forms of precancerous polyps or small growths are found or if you have a family history or genetic risk for colorectal cancer, you may need to be screened more often.  Skin Cancer  Check your skin from head to toe regularly.  Monitor any moles. Be sure to tell your health care provider: ? About any new moles or changes in moles, especially if there is a change in a mole's shape or color. ? If you have a mole that is larger than the size of a pencil eraser.  If any of your family members has a history of skin cancer, especially at a young age, talk with your health care provider about genetic screening.  Always use sunscreen. Apply sunscreen liberally and repeatedly throughout the day.  Whenever you are outside, protect yourself by wearing long sleeves, pants, a wide-brimmed hat, and sunglasses.  What should I know about osteoporosis? Osteoporosis is a condition in which bone destruction happens more quickly than new bone creation. After menopause, you may be at an increased risk for osteoporosis. To help prevent osteoporosis or the bone fractures that can happen because of osteoporosis, the following is recommended:  If you are 46-71 years old, get at least 1,000 mg of calcium and at least 600 mg of vitamin D per day.  If you are older than age 55 but younger than age 65, get at least 1,200 mg of calcium and at least 600 mg of vitamin D per day.  If you are older than age 54, get at least 1,200 mg of calcium and at least 800 mg of vitamin D per day.  Smoking and excessive alcohol intake increase the risk of osteoporosis. Eat foods that are rich in calcium and vitamin D, and do weight-bearing exercises several times each week as directed by your health care provider. What should I know about how menopause affects my mental health? Depression may occur at any age, but it is more common as you become older. Common symptoms of depression  include:  Low or sad mood.  Changes in sleep patterns.  Changes in appetite or eating patterns.  Feeling an overall lack of motivation or enjoyment of activities that you previously enjoyed.  Frequent crying spells.  Talk with your health care provider if you think that you are experiencing depression. What should I know about immunizations? It is important that you get and maintain your immunizations. These include:  Tetanus, diphtheria, and pertussis (Tdap) booster vaccine.  Influenza every year before the flu season begins.  Pneumonia vaccine.  Shingles vaccine.  Your health care provider may also recommend other immunizations. This information is not intended to replace advice given to you by your health care provider. Make sure you discuss any questions you have with your health care provider. Document Released: 10/12/2005  Document Revised: 03/09/2016 Document Reviewed: 05/24/2015 Elsevier Interactive Patient Education  [employer].  1. Pap smear is done 2. Mammogram is ordered 3. Screening labs are to be obtained through employer 4. Continue with healthy eating, exercise, and controlled weight loss. Goal is 5 pound weight loss in 1 year 5. Continue with calcium and vitamin D supplementation 6. Return in 1 year for annual exam

## 2017-04-24 ENCOUNTER — Ambulatory Visit: Admission: RE | Admit: 2017-04-24 | Discharge: 2017-04-24 | Payer: Commercial Managed Care - PPO

## 2017-04-24 DIAGNOSIS — H409 Unspecified glaucoma: Secondary | ICD-10-CM | POA: Diagnosis not present

## 2017-04-24 DIAGNOSIS — H3552 Pigmentary retinal dystrophy: Secondary | ICD-10-CM | POA: Diagnosis not present

## 2017-04-24 DIAGNOSIS — H35351 Cystoid macular degeneration, right eye: Secondary | ICD-10-CM | POA: Diagnosis not present

## 2017-04-24 DIAGNOSIS — H35359 Cystoid macular degeneration, unspecified eye: Principal | ICD-10-CM

## 2017-04-25 LAB — PAPIG, HPV, RFX 16/18
HPV, high-risk: NEGATIVE
PAP SMEAR COMMENT: 0

## 2017-05-24 ENCOUNTER — Ambulatory Visit
Admission: RE | Admit: 2017-05-24 | Discharge: 2017-05-24 | Payer: Commercial Managed Care - PPO | Attending: Student in an Organized Health Care Education/Training Program | Admitting: Student in an Organized Health Care Education/Training Program

## 2017-05-24 DIAGNOSIS — H4089 Other specified glaucoma: Secondary | ICD-10-CM | POA: Diagnosis not present

## 2017-05-24 DIAGNOSIS — H40003 Preglaucoma, unspecified, bilateral: Principal | ICD-10-CM

## 2017-05-29 ENCOUNTER — Ambulatory Visit: Admission: RE | Admit: 2017-05-29 | Discharge: 2017-05-29 | Payer: Commercial Managed Care - PPO

## 2017-05-29 DIAGNOSIS — Z9841 Cataract extraction status, right eye: Secondary | ICD-10-CM | POA: Diagnosis not present

## 2017-05-29 DIAGNOSIS — H35351 Cystoid macular degeneration, right eye: Secondary | ICD-10-CM | POA: Diagnosis not present

## 2017-05-29 DIAGNOSIS — H3581 Retinal edema: Secondary | ICD-10-CM | POA: Diagnosis not present

## 2017-05-29 DIAGNOSIS — H2512 Age-related nuclear cataract, left eye: Secondary | ICD-10-CM

## 2017-05-29 DIAGNOSIS — H35359 Cystoid macular degeneration, unspecified eye: Secondary | ICD-10-CM

## 2017-05-29 DIAGNOSIS — Z961 Presence of intraocular lens: Secondary | ICD-10-CM

## 2017-07-05 ENCOUNTER — Ambulatory Visit
Admission: RE | Admit: 2017-07-05 | Discharge: 2017-07-05 | Disposition: A | Discharge status: Home / Self Care | Payer: 59 | Source: Ambulatory Visit | Attending: Obstetrics and Gynecology | Admitting: Obstetrics and Gynecology

## 2017-07-05 DIAGNOSIS — Z853 Personal history of malignant neoplasm of breast: Secondary | ICD-10-CM | POA: Diagnosis not present

## 2017-07-05 DIAGNOSIS — Z1231 Encounter for screening mammogram for malignant neoplasm of breast: Secondary | ICD-10-CM | POA: Insufficient documentation

## 2017-07-15 ENCOUNTER — Telehealth: Payer: Self-pay | Admitting: Obstetrics and Gynecology

## 2017-07-15 NOTE — Telephone Encounter (Signed)
Patient called and stated that she has yet to receive her results for her mammogram. The patient would like to have a call back from a nurse or Dr. Tennis Must to discuss the results. No other information was disclosed. Please advise.  (Patient is aware that Dr. Tennis Must and Joyice Faster are not in the office today 07/15/17; Patient is okay with waiting to hear back)

## 2017-07-16 NOTE — Telephone Encounter (Signed)
Pt aware mammo is wnl.

## 2017-08-21 ENCOUNTER — Ambulatory Visit: Admission: RE | Admit: 2017-08-21 | Discharge: 2017-08-21 | Payer: Commercial Managed Care - PPO

## 2017-08-21 DIAGNOSIS — H3581 Retinal edema: Secondary | ICD-10-CM | POA: Diagnosis not present

## 2017-08-21 DIAGNOSIS — H3091 Unspecified chorioretinal inflammation, right eye: Secondary | ICD-10-CM | POA: Diagnosis not present

## 2017-08-29 DIAGNOSIS — J309 Allergic rhinitis, unspecified: Secondary | ICD-10-CM | POA: Diagnosis not present

## 2017-08-29 DIAGNOSIS — J340 Abscess, furuncle and carbuncle of nose: Secondary | ICD-10-CM | POA: Diagnosis not present

## 2017-08-29 DIAGNOSIS — H9311 Tinnitus, right ear: Secondary | ICD-10-CM | POA: Diagnosis not present

## 2017-08-30 DIAGNOSIS — J309 Allergic rhinitis, unspecified: Secondary | ICD-10-CM | POA: Diagnosis not present

## 2017-09-12 ENCOUNTER — Ambulatory Visit: Admit: 2017-09-12 | Discharge: 2017-09-13 | Payer: PRIVATE HEALTH INSURANCE

## 2017-09-12 DIAGNOSIS — H3091 Unspecified chorioretinal inflammation, right eye: Secondary | ICD-10-CM

## 2017-09-12 DIAGNOSIS — Z9841 Cataract extraction status, right eye: Secondary | ICD-10-CM

## 2017-09-12 DIAGNOSIS — Z961 Presence of intraocular lens: Secondary | ICD-10-CM

## 2017-09-12 DIAGNOSIS — H2512 Age-related nuclear cataract, left eye: Secondary | ICD-10-CM

## 2017-09-12 DIAGNOSIS — H3581 Retinal edema: Principal | ICD-10-CM

## 2017-10-29 ENCOUNTER — Encounter: Payer: 59 | Admitting: Obstetrics and Gynecology

## 2017-11-08 ENCOUNTER — Ambulatory Visit
Admit: 2017-11-08 | Discharge: 2017-11-09 | Payer: PRIVATE HEALTH INSURANCE | Attending: Otolaryngology | Primary: Otolaryngology

## 2017-11-08 ENCOUNTER — Ambulatory Visit: Admit: 2017-11-08 | Discharge: 2017-11-09 | Payer: PRIVATE HEALTH INSURANCE

## 2017-11-08 DIAGNOSIS — H9311 Tinnitus, right ear: Secondary | ICD-10-CM

## 2017-11-08 DIAGNOSIS — H6981 Other specified disorders of Eustachian tube, right ear: Principal | ICD-10-CM

## 2017-11-29 ENCOUNTER — Encounter: Payer: Self-pay | Admitting: Podiatry

## 2017-11-29 ENCOUNTER — Ambulatory Visit: Payer: 59 | Admitting: Podiatry

## 2017-11-29 DIAGNOSIS — L600 Ingrowing nail: Secondary | ICD-10-CM

## 2017-11-29 NOTE — Patient Instructions (Signed)

## 2017-12-04 NOTE — Progress Notes (Signed)
   Subjective: Patient presents today for evaluation of an issue to the medial border of the right great toe that has been present for several years. She states she has a nail avulsion procedure done to the area 10 years ago and now has a piece of nail sticking out. She has not done anything to treat the area. There are no modifying factors noted and she denies pain. Patient presents today for further treatment and evaluation.   Past Medical History:  Diagnosis Date  . Breast cancer ([place]) 2010   Right breast cancer - radiation  . GERD (gastroesophageal reflux disease)   . Glaucoma   . Personal history of radiation therapy   . Urinary frequency   . Vaginal discharge     Objective:  General: Well developed, nourished, in no acute distress, alert and oriented x3   Dermatology: Skin is warm, dry and supple bilateral. Nail spicule noted to the medial aspect of the right great toe. The remaining nails appear unremarkable at this time. There are no open sores, lesions.  Vascular: Dorsalis Pedis artery and Posterior Tibial artery pedal pulses palpable. No lower extremity edema noted.   Neruologic: Grossly intact via light touch bilateral.  Musculoskeletal: Muscular strength within normal limits in all groups bilateral. Normal range of motion noted to all pedal and ankle joints.   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 Patient evaluated.  2. Discussed treatment alternatives and plan of care. Explained nail avulsion procedure and post procedure course to patient. 3. Patient opted for permanent partial nail avulsion of the medial border of the right great toe.  4. Prior to procedure, local anesthesia infiltration utilized using 3 ml of a 50:50 mixture of 2% plain lidocaine and 0.5% plain marcaine in a normal hallux block fashion and a betadine prep performed.  5. Partial permanent nail avulsion with  chemical matrixectomy performed using [id number] applications of phenol followed by alcohol flush.  6. Light dressing applied. 7. Return to clinic in 2 weeks.   Edrick Kins, DPM [facility]  Dr. Edrick Kins, [address]                                        [place], [place] [address]                Office [phone]  Fax [phone]

## 2017-12-13 ENCOUNTER — Encounter: Payer: Self-pay | Admitting: Podiatry

## 2017-12-18 ENCOUNTER — Ambulatory Visit: Admit: 2017-12-18 | Discharge: 2017-12-19 | Payer: PRIVATE HEALTH INSURANCE

## 2017-12-18 DIAGNOSIS — Z9841 Cataract extraction status, right eye: Secondary | ICD-10-CM

## 2017-12-18 DIAGNOSIS — Z961 Presence of intraocular lens: Secondary | ICD-10-CM

## 2017-12-18 DIAGNOSIS — H2512 Age-related nuclear cataract, left eye: Secondary | ICD-10-CM

## 2017-12-18 DIAGNOSIS — H3581 Retinal edema: Principal | ICD-10-CM

## 2018-01-17 ENCOUNTER — Ambulatory Visit: Admit: 2018-01-17 | Discharge: 2018-01-18 | Payer: PRIVATE HEALTH INSURANCE

## 2018-01-17 DIAGNOSIS — H3581 Retinal edema: Principal | ICD-10-CM

## 2018-03-07 ENCOUNTER — Ambulatory Visit: Admit: 2018-03-07 | Discharge: 2018-03-08 | Payer: PRIVATE HEALTH INSURANCE

## 2018-03-07 DIAGNOSIS — H3581 Retinal edema: Principal | ICD-10-CM

## 2018-04-11 NOTE — Progress Notes (Signed)
ANNUAL PREVENTATIVE CARE GYN  ENCOUNTER NOTE  Subjective:       Victoria Greene is a 52 y.o. G69P2001 female here for a routine annual gynecologic exam.  Current complaints:  1.  None   Diagnosed with GERD. On omeprazole with control of symptoms. Bowel function is without significant interval changes; she does have some chronic constipation that requires laxatives.  On occasion she has some right lower quadrant fullness which seems to resolve when she has defecation. Bladder function normal. Mom was diagnosed with stage IV pancreatic cancer recently.   Gynecologic History No LMP recorded. Patient has had a hysterectomy. Contraception: status post hysterectomy Last Pap: 04/2017 neg/neg. Results were: normal Last mammogram: 07/05/2017 birad 2. Results were: normal BRCA1/BRCA2 negative History of breast cancer 10 years ago; status post lumpectomy and radiation therapy; NED  Obstetric History OB History  Gravida Para Term Preterm AB Living  2 2 2     1   SAB TAB Ectopic Multiple Live Births          1    # Outcome Date GA Lbr Len/2nd Weight Sex Delivery Anes PTL Lv  2 Term 1998   9 lb (4.082 kg) M CS-LTranv   LIV  1 Term 1991   7 lb 2.6 oz (3.248 kg) M CS-LTranv       Past Medical History:  Diagnosis Date  . Breast cancer ([place]) 2010   Right breast cancer - radiation  . GERD (gastroesophageal reflux disease)   . Glaucoma   . Personal history of radiation therapy   . Urinary frequency   . Vaginal discharge     Past Surgical History:  Procedure Laterality Date  . BREAST BIOPSY Left 2010   stereotactic  . BREAST EXCISIONAL BIOPSY Right 2010   +  . BREAST LUMPECTOMY Right 2010  . BREAST LUMPECTOMY    . CESAREAN SECTION    . VAGINAL HYSTERECTOMY  2009   lsh    Current Outpatient Medications on File Prior to Visit  Medication Sig Dispense Refill  . cyanocobalamin 1000 MCG tablet Take 1,000 mcg by mouth daily.    . fluticasone (FLONASE) 50 MCG/ACT nasal spray Place into the  nose.    . loratadine (CLARITIN) 10 MG tablet Take 10 mg by mouth.    . Multiple Vitamin (MULTIVITAMIN) capsule Take 1 capsule by mouth daily.    Marland Kitchen omeprazole (PRILOSEC) 20 MG capsule      No current facility-administered medications on file prior to visit.     Allergies  Allergen Reactions  . Shellfish Allergy Rash  . Shellfish-Derived Products Rash    Social History   Socioeconomic History  . Marital status: Divorced    Spouse name: Not on file  . Number of children: Not on file  . Years of education: Not on file  . Highest education level: Not on file  Occupational History  . Not on file  Social Needs  . Financial resource strain: Not on file  . Food insecurity:    Worry: Not on file    Inability: Not on file  . Transportation needs:    Medical: Not on file    Non-medical: Not on file  Tobacco Use  . Smoking status: Never Smoker  . Smokeless tobacco: Never Used  Substance and Sexual Activity  . Alcohol use: Yes    Comment: occas  . Drug use: No  . Sexual activity: Not Currently    Birth control/protection: None  Lifestyle  . Physical activity:  Days per week: Not on file    Minutes per session: Not on file  . Stress: Not on file  Relationships  . Social connections:    Talks on phone: Not on file    Gets together: Not on file    Attends religious service: Not on file    Active member of club or organization: Not on file    Attends meetings of clubs or organizations: Not on file    Relationship status: Not on file  . Intimate partner violence:    Fear of current or ex partner: Not on file    Emotionally abused: Not on file    Physically abused: Not on file    Forced sexual activity: Not on file  Other Topics Concern  . Not on file  Social History Narrative  . Not on file    Family History  Problem Relation Age of Onset  . Breast cancer Maternal Aunt 38  . Breast cancer Maternal Aunt 56  . Heart disease Father   . Thyroid disease Mother   .  Pancreatic cancer Mother   . Ovarian cancer Neg Hx   . Colon cancer Neg Hx   . Diabetes Neg Hx     The following portions of the patient's history were reviewed and updated as appropriate: allergies, current medications, past family history, past medical history, past social history, past surgical history and problem list.  Review of Systems Review of Systems  Constitutional: Negative.   HENT: Negative.   Eyes: Negative.   Respiratory: Negative.   Gastrointestinal: Positive for constipation.  Genitourinary: Positive for frequency.  Musculoskeletal: Negative.   Skin:       Rash on abdomen secondary to nickel allergy, resolving  Neurological: Negative.   Endo/Heme/Allergies: Negative.   Psychiatric/Behavioral: Negative.     Objective:  BP 123/66   Pulse 74   Ht 5' 1"  (1.549 m)   Wt 149 lb 8 oz (67.8 kg)   BMI 28.25 kg/m  CONSTITUTIONAL: Well-developed, well-nourished female in no acute distress.  PSYCHIATRIC: Normal mood and affect. Normal behavior. Normal judgment and thought content. [place]: Alert and oriented to person, place, and time. Normal muscle tone coordination. No cranial nerve deficit noted. HENT:  Normocephalic, atraumatic, External right and left ear normal.  EYES: Conjunctivae and EOM are normal.No scleral icterus.  NECK: Normal range of motion, supple, no masses.  Normal thyroid.  SKIN: Skin is warm and dry. No rash noted. Not diaphoretic. No erythema. No pallor. CARDIOVASCULAR: Normal heart rate noted, regular rhythm, no murmur. RESPIRATORY: Clear to auscultation bilaterally. Effort and breath sounds normal, no problems with respiration noted. BREASTS:  Right-scarring in the lower outer quadrant of the right areola from excisional biopsy of breast cancer; no palpable mass or significant tenderness; no adenopathy; no nipple discharge Left-curvilinear scar left outer quadrants approximately 5 cm well-healed (breast biopsy-microcalcifications); no palpable  mass or significant tenderness; no adenopathy; no nipple discharge ABDOMEN: Soft, no distention noted.  No tenderness, rebound or guarding. Midline incision well-healed without evidence of hernia BLADDER: Normal PELVIC:  External Genitalia: Normal  BUS: Normal  Vagina: Normal  Cervix: Normal; no cervical motion tenderness  Uterus: Surgically absent  Adnexa: Right lower quadrant fullness without obvious discrete mass, nontender.  RV: External Exam NormaI, No Rectal Masses and Normal Sphincter tone  MUSCULOSKELETAL: Normal range of motion. No tenderness.  No cyanosis, clubbing, or edema.  2+ distal pulses. LYMPHATIC: No Axillary, Supraclavicular, or Inguinal Adenopathy.    Assessment:   Annual  gynecologic examination 52 y.o. Contraception: status post hysterectomy LSH Normal BMI-28 Problem List Items Addressed This Visit    S/P hysterectomy   History of breast cancer   Overweight (BMI 25.0-29.9)    Other Visit Diagnoses    Well woman exam with routine gynecological exam    -  Primary    Right lower quadrant fullness/mass, likely bowel, however cannot rule out ovarian enlargement.  Plan:  Pap:Due 2021 Mammogram: Ordered- Stool Guaiac Testing: colonoscopy referral placed. Labs: thru employer Routine preventative health maintenance measures emphasized: Exercise/Diet/Weight control, Tobacco Warnings and Alcohol/Substance use risks  Continue with calcium and vitamin D supplementation Pelvic ultrasound is scheduled.  Results will be made available Return to [place], CMA  Brayton Mars, MD  Note: This dictation was prepared with Dragon dictation along with smaller phrase technology. Any transcriptional errors that result from this process are unintentional.

## 2018-04-24 ENCOUNTER — Ambulatory Visit (INDEPENDENT_AMBULATORY_CARE_PROVIDER_SITE_OTHER): Payer: Managed Care, Other (non HMO) | Admitting: Obstetrics and Gynecology

## 2018-04-24 ENCOUNTER — Encounter: Payer: Self-pay | Admitting: Obstetrics and Gynecology

## 2018-04-24 VITALS — BP 123/66 | HR 74 | Ht 61.0 in | Wt 149.5 lb

## 2018-04-24 DIAGNOSIS — Z01411 Encounter for gynecological examination (general) (routine) with abnormal findings: Secondary | ICD-10-CM

## 2018-04-24 DIAGNOSIS — Z1231 Encounter for screening mammogram for malignant neoplasm of breast: Secondary | ICD-10-CM | POA: Diagnosis not present

## 2018-04-24 DIAGNOSIS — Z1239 Encounter for other screening for malignant neoplasm of breast: Secondary | ICD-10-CM

## 2018-04-24 DIAGNOSIS — E663 Overweight: Secondary | ICD-10-CM | POA: Diagnosis not present

## 2018-04-24 DIAGNOSIS — Z800 Family history of malignant neoplasm of digestive organs: Secondary | ICD-10-CM

## 2018-04-24 DIAGNOSIS — R1903 Right lower quadrant abdominal swelling, mass and lump: Secondary | ICD-10-CM | POA: Diagnosis not present

## 2018-04-24 DIAGNOSIS — Z1211 Encounter for screening for malignant neoplasm of colon: Secondary | ICD-10-CM

## 2018-04-24 DIAGNOSIS — Z90710 Acquired absence of both cervix and uterus: Secondary | ICD-10-CM

## 2018-04-24 DIAGNOSIS — Z01419 Encounter for gynecological examination (general) (routine) without abnormal findings: Secondary | ICD-10-CM

## 2018-04-24 DIAGNOSIS — Z853 Personal history of malignant neoplasm of breast: Secondary | ICD-10-CM

## 2018-04-24 NOTE — Patient Instructions (Addendum)
1.  Pap smear is not done.  Next Pap smear is due 2021. 2.  Mammogram is ordered 3.  Screening colonoscopy is ordered. 4.  Screening labs are obtained through prior 5.  Continue with healthy eating and exercise. 6.  Continue calcium and vitamin D supplementation daily 7.  Return in 1 year for annual exam 8.  Pelvic ultrasound is ordered to assess right lower quadrant fullness noted on exam today   Health Maintenance for Postmenopausal Women Menopause is a normal process in which your reproductive ability comes to an end. This process happens gradually over a span of months to years, usually between the ages of 41 and 14. Menopause is complete when you have missed 12 consecutive menstrual periods. It is important to talk with your health care provider about some of the most common conditions that affect postmenopausal women, such as heart disease, cancer, and bone loss (osteoporosis). Adopting a healthy lifestyle and getting preventive care can help to promote your health and wellness. Those actions can also lower your chances of developing some of these common conditions. What should I know about menopause? During menopause, you may experience a number of symptoms, such as:  Moderate-to-severe hot flashes.  Night sweats.  Decrease in sex drive.  Mood swings.  Headaches.  Tiredness.  Irritability.  Memory problems.  Insomnia.  Choosing to treat or not to treat menopausal changes is an individual decision that you make with your health care provider. What should I know about hormone replacement therapy and supplements? Hormone therapy products are effective for treating symptoms that are associated with menopause, such as hot flashes and night sweats. Hormone replacement carries certain risks, especially as you become older. If you are thinking about using estrogen or estrogen with progestin treatments, discuss the benefits and risks with your health care provider. What should I  know about heart disease and stroke? Heart disease, heart attack, and stroke become more likely as you age. This may be due, in part, to the hormonal changes that your body experiences during menopause. These can affect how your body processes dietary fats, triglycerides, and cholesterol. Heart attack and stroke are both medical emergencies. There are many things that you can do to help prevent heart disease and stroke:  Have your blood pressure checked at least every 1-2 years. High blood pressure causes heart disease and increases the risk of stroke.  If you are 69-53 years old, ask your health care provider if you should take aspirin to prevent a heart attack or a stroke.  Do not use any tobacco products, including cigarettes, chewing tobacco, or electronic cigarettes. If you need help quitting, ask your health care provider.  It is important to eat a healthy diet and maintain a healthy weight. ? Be sure to include plenty of vegetables, fruits, low-fat dairy products, and lean protein. ? Avoid eating foods that are high in solid fats, added sugars, or salt (sodium).  Get regular exercise. This is one of the most important things that you can do for your health. ? Try to exercise for at least 150 minutes each week. The type of exercise that you do should increase your heart rate and make you sweat. This is known as moderate-intensity exercise. ? Try to do strengthening exercises at least twice each week. Do these in addition to the moderate-intensity exercise.  Know your numbers.Ask your health care provider to check your cholesterol and your blood glucose. Continue to have your blood tested as directed by your health  care provider.  What should I know about cancer screening? There are several types of cancer. Take the following steps to reduce your risk and to catch any cancer development as early as possible. Breast Cancer  Practice breast self-awareness. ? This means understanding how  your breasts normally appear and feel. ? It also means doing regular breast self-exams. Let your health care provider know about any changes, no matter how small.  If you are 59 or older, have a clinician do a breast exam (clinical breast exam or CBE) every year. Depending on your age, family history, and medical history, it may be recommended that you also have a yearly breast X-ray (mammogram).  If you have a family history of breast cancer, talk with your health care provider about genetic screening.  If you are at high risk for breast cancer, talk with your health care provider about having an MRI and a mammogram every year.  Breast cancer (BRCA) gene test is recommended for women who have family members with BRCA-related cancers. Results of the assessment will determine the need for genetic counseling and BRCA1 and for BRCA2 testing. BRCA-related cancers include these types: ? Breast. This occurs in males or females. ? Ovarian. ? Tubal. This may also be called fallopian tube cancer. ? Cancer of the abdominal or pelvic lining (peritoneal cancer). ? Prostate. ? Pancreatic.  Cervical, Uterine, and Ovarian Cancer Your health care provider may recommend that you be screened regularly for cancer of the pelvic organs. These include your ovaries, uterus, and vagina. This screening involves a pelvic exam, which includes checking for microscopic changes to the surface of your cervix (Pap test).  For women ages 21-65, health care providers may recommend a pelvic exam and a Pap test every three years. For women ages 47-65, they may recommend the Pap test and pelvic exam, combined with testing for human papilloma virus (HPV), every five years. Some types of HPV increase your risk of cervical cancer. Testing for HPV may also be done on women of any age who have unclear Pap test results.  Other health care providers may not recommend any screening for nonpregnant women who are considered low risk for  pelvic cancer and have no symptoms. Ask your health care provider if a screening pelvic exam is right for you.  If you have had past treatment for cervical cancer or a condition that could lead to cancer, you need Pap tests and screening for cancer for at least 20 years after your treatment. If Pap tests have been discontinued for you, your risk factors (such as having a new sexual partner) need to be reassessed to determine if you should start having screenings again. Some women have medical problems that increase the chance of getting cervical cancer. In these cases, your health care provider may recommend that you have screening and Pap tests more often.  If you have a family history of uterine cancer or ovarian cancer, talk with your health care provider about genetic screening.  If you have vaginal bleeding after reaching menopause, tell your health care provider.  There are currently no reliable tests available to screen for ovarian cancer.  Lung Cancer Lung cancer screening is recommended for adults 18-71 years old who are at high risk for lung cancer because of a history of smoking. A yearly low-dose CT scan of the lungs is recommended if you:  Currently smoke.  Have a history of at least 30 pack-years of smoking and you currently smoke or have quit  within the past 15 years. A pack-year is smoking an average of one pack of cigarettes per day for one year.  Yearly screening should:  Continue until it has been 15 years since you quit.  Stop if you develop a health problem that would prevent you from having lung cancer treatment.  Colorectal Cancer  This type of cancer can be detected and can often be prevented.  Routine colorectal cancer screening usually begins at age 95 and continues through age 64.  If you have risk factors for colon cancer, your health care provider may recommend that you be screened at an earlier age.  If you have a family history of colorectal cancer, talk  with your health care provider about genetic screening.  Your health care provider may also recommend using home test kits to check for hidden blood in your stool.  A small camera at the end of a tube can be used to examine your colon directly (sigmoidoscopy or colonoscopy). This is done to check for the earliest forms of colorectal cancer.  Direct examination of the colon should be repeated every 5-10 years until age 66. However, if early forms of precancerous polyps or small growths are found or if you have a family history or genetic risk for colorectal cancer, you may need to be screened more often.  Skin Cancer  Check your skin from head to toe regularly.  Monitor any moles. Be sure to tell your health care provider: ? About any new moles or changes in moles, especially if there is a change in a mole's shape or color. ? If you have a mole that is larger than the size of a pencil eraser.  If any of your family members has a history of skin cancer, especially at a young age, talk with your health care provider about genetic screening.  Always use sunscreen. Apply sunscreen liberally and repeatedly throughout the day.  Whenever you are outside, protect yourself by wearing long sleeves, pants, a wide-brimmed hat, and sunglasses.  What should I know about osteoporosis? Osteoporosis is a condition in which bone destruction happens more quickly than new bone creation. After menopause, you may be at an increased risk for osteoporosis. To help prevent osteoporosis or the bone fractures that can happen because of osteoporosis, the following is recommended:  If you are 7-22 years old, get at least 1,000 mg of calcium and at least 600 mg of vitamin D per day.  If you are older than age 49 but younger than age 66, get at least 1,200 mg of calcium and at least 600 mg of vitamin D per day.  If you are older than age 49, get at least 1,200 mg of calcium and at least 800 mg of vitamin D per  day.  Smoking and excessive alcohol intake increase the risk of osteoporosis. Eat foods that are rich in calcium and vitamin D, and do weight-bearing exercises several times each week as directed by your health care provider. What should I know about how menopause affects my mental health? Depression may occur at any age, but it is more common as you become older. Common symptoms of depression include:  Low or sad mood.  Changes in sleep patterns.  Changes in appetite or eating patterns.  Feeling an overall lack of motivation or enjoyment of activities that you previously enjoyed.  Frequent crying spells.  Talk with your health care provider if you think that you are experiencing depression. What should I know about immunizations?  It is important that you get and maintain your immunizations. These include:  Tetanus, diphtheria, and pertussis (Tdap) booster vaccine.  Influenza every year before the flu season begins.  Pneumonia vaccine.  Shingles vaccine.  Your health care provider may also recommend other immunizations. This information is not intended to replace advice given to you by your health care provider. Make sure you discuss any questions you have with your health care provider. Document Released: 10/12/2005 Document Revised: 03/09/2016 Document Reviewed: 05/24/2015 Elsevier Interactive Patient Education  2018 [employer].

## 2018-04-25 ENCOUNTER — Ambulatory Visit (INDEPENDENT_AMBULATORY_CARE_PROVIDER_SITE_OTHER): Payer: Managed Care, Other (non HMO)

## 2018-04-25 DIAGNOSIS — R1903 Right lower quadrant abdominal swelling, mass and lump: Secondary | ICD-10-CM | POA: Diagnosis not present

## 2018-04-28 ENCOUNTER — Telehealth: Payer: Self-pay | Admitting: Obstetrics and Gynecology

## 2018-04-28 NOTE — Telephone Encounter (Signed)
The patient called and stated that she would like to know the status of her u/s results Please advise.

## 2018-04-29 ENCOUNTER — Other Ambulatory Visit: Payer: Self-pay

## 2018-04-30 ENCOUNTER — Encounter: Payer: Self-pay | Admitting: Gastroenterology

## 2018-04-30 ENCOUNTER — Telehealth: Payer: Self-pay | Admitting: Obstetrics and Gynecology

## 2018-04-30 ENCOUNTER — Ambulatory Visit (INDEPENDENT_AMBULATORY_CARE_PROVIDER_SITE_OTHER): Payer: Managed Care, Other (non HMO) | Admitting: Gastroenterology

## 2018-04-30 ENCOUNTER — Other Ambulatory Visit: Payer: Self-pay

## 2018-04-30 VITALS — BP 137/89 | HR 77 | Resp 17 | Ht 61.0 in | Wt 149.8 lb

## 2018-04-30 DIAGNOSIS — Z1211 Encounter for screening for malignant neoplasm of colon: Secondary | ICD-10-CM | POA: Diagnosis not present

## 2018-04-30 DIAGNOSIS — K5904 Chronic idiopathic constipation: Secondary | ICD-10-CM | POA: Diagnosis not present

## 2018-04-30 DIAGNOSIS — K219 Gastro-esophageal reflux disease without esophagitis: Secondary | ICD-10-CM

## 2018-04-30 NOTE — Telephone Encounter (Signed)
The patient has questions about her ultrasound and is asking for Crystal to contact her at her next available opportunity, please advise, thanks.

## 2018-04-30 NOTE — Telephone Encounter (Signed)
See previous note. Pt aware.

## 2018-04-30 NOTE — Progress Notes (Addendum)
Cephas Darby, MD [address]  [place]  [place], [place] [address]  Main: [phone]  Fax: [phone]    Gastroenterology Consultation  Referring Provider:   Defrancesco, Alanda Slim, MD   Primary Care Physician:  Patient, No Pcp Per Primary Gastroenterologist:  Dr. Cephas Darby Reason for Consultation:     Chronic constipation, chronic GERD        HPI:   Victoria Greene is a 52 y.o. female referred by Dr. Brayton Mars  for consultation & management of chronic constipation. She reports history of constipation for several years, ever since she was very young. Patient reports that she has been dependent on Dulcolax and Fleet enemas for a long time for bowel evacuation. She is healthy, incorporates good amount of protein in her diet. She tried MiraLAX but didn't help. She finds that her stools are generally soft but doesn't have the urge to have a BM. She has been dependent on Dulcolax. She does fleet enema as needed about once a week to have a bowel movement. She reports bloating associated with nausea and burping after 3 or 4 days of having a bowel movement. Her weight has been stable. She never had a colonoscopy.  She also reports history of regurgitation, frequent burping. She used to eat a lot of spicy foods and was recommended to avoid all the food triggers by her primary care provider. This has helped with her symptoms significantly. She is currently on omeprazole over-the-counter, 20 mg twice daily. She does report some residual symptoms. She denies any other GI symptoms. She has history of breast cancer, under remission She works for [employer]. She does not smoke or drink alcohol  NSAIDs: none  Antiplts/Anticoagulants/Anti thrombotics: none  GI Procedures: none Mother had stage IV pancreatic cancer at age 3, she is nonsmoker, unknown etiology She had C-sections and hysterectomy  Past Medical History:  Diagnosis Date  . Breast cancer ([place]) 2010   Right  breast cancer - radiation  . GERD (gastroesophageal reflux disease)   . Glaucoma   . Personal history of radiation therapy   . Urinary frequency   . Vaginal discharge     Past Surgical History:  Procedure Laterality Date  . BREAST BIOPSY Left 2010   stereotactic  . BREAST EXCISIONAL BIOPSY Right 2010   +  . BREAST LUMPECTOMY Right 2010  . BREAST LUMPECTOMY    . CESAREAN SECTION    . VAGINAL HYSTERECTOMY  2009   lsh    Current Outpatient Medications:  .  cyanocobalamin 1000 MCG tablet, Take 1,000 mcg by mouth daily., Disp: , Rfl:  .  fluticasone (FLONASE) 50 MCG/ACT nasal spray, Place into the nose., Disp: , Rfl:  .  Multiple Vitamin (MULTIVITAMIN) capsule, Take 1 capsule by mouth daily., Disp: , Rfl:  .  omeprazole (PRILOSEC OTC) 20 MG tablet, Take by mouth., Disp: , Rfl:     Family History  Problem Relation Age of Onset  . Breast cancer Maternal Aunt 38  . Breast cancer Maternal Aunt 76  . Heart disease Father   . Thyroid disease Mother   . Pancreatic cancer Mother   . Ovarian cancer Neg Hx   . Colon cancer Neg Hx   . Diabetes Neg Hx      Social History   Tobacco Use  . Smoking status: Never Smoker  . Smokeless tobacco: Never Used  Substance Use Topics  . Alcohol use: Yes    Comment: occas  .  Drug use: No    Allergies as of 04/30/2018 - Review Complete 04/30/2018  Allergen Reaction Noted  . Nickel Rash 04/24/2018  . Shellfish allergy Rash 12/31/2012  . Shellfish-derived products Rash 02/27/2016    Review of Systems:    All systems reviewed and negative except where noted in HPI.   Physical Exam:  BP 137/89 (BP Location: Left Arm, Patient Position: Sitting, Cuff Size: Normal)   Pulse 77   Resp 17   Ht 5\' 1"  (1.549 m)   Wt 149 lb 12.8 oz (67.9 kg)   BMI 28.30 kg/m  No LMP recorded. Patient has had a hysterectomy.  General:   Alert,  Well-developed, well-nourished, pleasant and cooperative in NAD Head:  Normocephalic and atraumatic. Eyes:   Sclera clear, no icterus.   Conjunctiva pink. Ears:  Normal auditory acuity. Nose:  No deformity, discharge, or lesions. Mouth:  No deformity or lesions,oropharynx pink & moist. Neck:  Supple; no masses or thyromegaly. Lungs:  Respirations even and unlabored.  Clear throughout to auscultation.   No wheezes, crackles, or rhonchi. No acute distress. Heart:  Regular rate and rhythm; no murmurs, clicks, rubs, or gallops. Abdomen:  Normal bowel sounds. Soft, non-tender and non-distended without masses, hepatosplenomegaly or hernias noted.  No guarding or rebound tenderness.   Rectal: Not performed Msk:  Symmetrical without gross deformities. Good, equal movement & strength bilaterally. Pulses:  Normal pulses noted. Extremities:  No clubbing or edema.  No cyanosis. Neurologic:  Alert and oriented x3;  grossly normal neurologically. Skin:  Intact without significant lesions or rashes. No jaundice. Lymph Nodes:  No significant cervical adenopathy. Psych:  Alert and cooperative. Normal mood and affect.  Imaging Studies: reviewed  Assessment and Plan:   CHANDLER STOFER is a 52 y.o. 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

## 2018-04-30 NOTE — Patient Instructions (Signed)
High-Fiber Diet  Fiber, also called dietary fiber, is a type of carbohydrate found in fruits, vegetables, whole grains, and beans. A high-fiber diet can have many health benefits. Your health care provider may recommend a high-fiber diet to help:  · Prevent constipation. Fiber can make your bowel movements more regular.  · Lower your cholesterol.  · Relieve hemorrhoids, uncomplicated diverticulosis, or irritable bowel syndrome.  · Prevent overeating as part of a weight-loss plan.  · Prevent heart disease, type 2 diabetes, and certain cancers.    What is my plan?  The recommended daily intake of fiber includes:  · 38 grams for men under age 50.  · 30 grams for men over age 50.  · 25 grams for women under age 50.  · 21 grams for women over age 50.    You can get the recommended daily intake of dietary fiber by eating a variety of fruits, vegetables, grains, and beans. Your health care provider may also recommend a fiber supplement if it is not possible to get enough fiber through your diet.  What do I need to know about a high-fiber diet?  · Fiber supplements have not been widely studied for their effectiveness, so it is better to get fiber through food sources.  · Always check the fiber content on the nutrition facts label of any prepackaged food. Look for foods that contain at least 5 grams of fiber per serving.  · Ask your dietitian if you have questions about specific foods that are related to your condition, especially if those foods are not listed in the following section.  · Increase your daily fiber consumption gradually. Increasing your intake of dietary fiber too quickly may cause bloating, cramping, or gas.  · Drink plenty of water. Water helps you to digest fiber.  What foods can I eat?  Grains  Whole-grain breads. Multigrain cereal. Oats and oatmeal. Brown rice. Barley. Bulgur wheat. Millet. Bran muffins. Popcorn. Rye wafer crackers.  Vegetables   Sweet potatoes. Spinach. Kale. Artichokes. Cabbage. Broccoli. Green peas. Carrots. Squash.  Fruits  Berries. Pears. Apples. Oranges. Avocados. Prunes and raisins. Dried figs.  Meats and Other Protein Sources  Navy, kidney, pinto, and soy beans. Split peas. Lentils. Nuts and seeds.  Dairy  Fiber-fortified yogurt.  Beverages  Fiber-fortified soy milk. Fiber-fortified orange juice.  Other  Fiber bars.  The items listed above may not be a complete list of recommended foods or beverages. Contact your dietitian for more options.  What foods are not recommended?  Grains  White bread. Pasta made with refined flour. White rice.  Vegetables  Fried potatoes. Canned vegetables. Well-cooked vegetables.  Fruits  Fruit juice. Cooked, strained fruit.  Meats and Other Protein Sources  Fatty cuts of meat. Fried poultry or fried fish.  Dairy  Milk. Yogurt. Cream cheese. Sour cream.  Beverages  Soft drinks.  Other  Cakes and pastries. Butter and oils.  The items listed above may not be a complete list of foods and beverages to avoid. Contact your dietitian for more information.  What are some tips for including high-fiber foods in my diet?  · Eat a wide variety of high-fiber foods.  · Make sure that half of all grains consumed each day are whole grains.  · Replace breads and cereals made from refined flour or white flour with whole-grain breads and cereals.  · Replace white rice with brown rice, bulgur wheat, or millet.  · Start the day with a breakfast that is high in fiber,   such as a cereal that contains at least 5 grams of fiber per serving.  · Use beans in place of meat in soups, salads, or pasta.  · Eat high-fiber snacks, such as berries, raw vegetables, nuts, or popcorn.  This information is not intended to replace advice given to you by your health care provider. Make sure you discuss any questions you have with your health care provider.  Document Released: 08/20/2005 Document Revised: 01/26/2016 Document Reviewed: 02/02/2014   Elsevier Interactive Patient Education © 2018 [employer]

## 2018-04-30 NOTE — Telephone Encounter (Signed)
Pt aware of u/s results. U/s and f/u after is scheduled.

## 2018-05-01 ENCOUNTER — Encounter: Payer: Self-pay | Admitting: Gastroenterology

## 2018-05-02 ENCOUNTER — Ambulatory Visit: Admit: 2018-05-02 | Discharge: 2018-05-03 | Payer: PRIVATE HEALTH INSURANCE

## 2018-05-02 DIAGNOSIS — H3581 Retinal edema: Principal | ICD-10-CM

## 2018-05-08 ENCOUNTER — Other Ambulatory Visit: Payer: Self-pay

## 2018-05-22 ENCOUNTER — Other Ambulatory Visit: Payer: Self-pay | Admitting: Obstetrics and Gynecology

## 2018-05-22 DIAGNOSIS — Z90710 Acquired absence of both cervix and uterus: Secondary | ICD-10-CM

## 2018-05-22 DIAGNOSIS — R1031 Right lower quadrant pain: Secondary | ICD-10-CM

## 2018-05-23 ENCOUNTER — Encounter: Payer: Self-pay | Admitting: *Deleted

## 2018-05-26 ENCOUNTER — Ambulatory Visit
Admission: RE | Admit: 2018-05-26 | Discharge: 2018-05-26 | Disposition: A | Discharge status: Home / Self Care | Payer: Managed Care, Other (non HMO) | Source: Ambulatory Visit | Attending: Gastroenterology | Admitting: Gastroenterology

## 2018-05-26 ENCOUNTER — Ambulatory Visit: Payer: Managed Care, Other (non HMO) | Admitting: Anesthesiology

## 2018-05-26 ENCOUNTER — Encounter
Admission: RE | Disposition: A | Discharge status: Home / Self Care | Payer: Self-pay | Source: Ambulatory Visit | Attending: Gastroenterology

## 2018-05-26 ENCOUNTER — Encounter: Payer: Self-pay | Admitting: *Deleted

## 2018-05-26 DIAGNOSIS — K219 Gastro-esophageal reflux disease without esophagitis: Secondary | ICD-10-CM | POA: Diagnosis not present

## 2018-05-26 DIAGNOSIS — H409 Unspecified glaucoma: Secondary | ICD-10-CM | POA: Diagnosis not present

## 2018-05-26 DIAGNOSIS — Z853 Personal history of malignant neoplasm of breast: Secondary | ICD-10-CM | POA: Insufficient documentation

## 2018-05-26 DIAGNOSIS — D123 Benign neoplasm of transverse colon: Secondary | ICD-10-CM | POA: Insufficient documentation

## 2018-05-26 DIAGNOSIS — Z1211 Encounter for screening for malignant neoplasm of colon: Secondary | ICD-10-CM | POA: Insufficient documentation

## 2018-05-26 DIAGNOSIS — Z79899 Other long term (current) drug therapy: Secondary | ICD-10-CM | POA: Diagnosis not present

## 2018-05-26 HISTORY — PX: ESOPHAGOGASTRODUODENOSCOPY (EGD) WITH PROPOFOL: SHX5813

## 2018-05-26 HISTORY — PX: COLONOSCOPY WITH PROPOFOL: SHX5780

## 2018-05-26 SURGERY — ESOPHAGOGASTRODUODENOSCOPY (EGD) WITH PROPOFOL
Anesthesia: General

## 2018-05-26 MED ORDER — PROPOFOL 10 MG/ML IV BOLUS
INTRAVENOUS | Status: DC | PRN
  Administered 2018-05-26: 60 mg via INTRAVENOUS
  Administered 2018-05-26: 40 mg via INTRAVENOUS

## 2018-05-26 MED ORDER — PROPOFOL 500 MG/50ML IV EMUL
INTRAVENOUS | Status: AC
  Filled 2018-05-26: qty 50

## 2018-05-26 MED ORDER — EPHEDRINE SULFATE 50 MG/ML IJ SOLN
INTRAMUSCULAR | Status: DC | PRN
  Administered 2018-05-26: 10 mg via INTRAVENOUS

## 2018-05-26 MED ORDER — EPHEDRINE SULFATE 50 MG/ML IJ SOLN
INTRAMUSCULAR | Status: AC
  Filled 2018-05-26: qty 1

## 2018-05-26 MED ORDER — LIDOCAINE HCL (PF) 2 % IJ SOLN
INTRAMUSCULAR | Status: AC
  Filled 2018-05-26: qty 10

## 2018-05-26 MED ORDER — MIDAZOLAM HCL 2 MG/2ML IJ SOLN
INTRAMUSCULAR | Status: DC | PRN
  Administered 2018-05-26: 1 mg via INTRAVENOUS

## 2018-05-26 MED ORDER — PROPOFOL 500 MG/50ML IV EMUL
INTRAVENOUS | Status: DC | PRN
  Administered 2018-05-26: 160 ug/kg/min via INTRAVENOUS

## 2018-05-26 MED ORDER — FENTANYL CITRATE (PF) 100 MCG/2ML IJ SOLN
INTRAMUSCULAR | Status: AC
  Filled 2018-05-26: qty 2

## 2018-05-26 MED ORDER — MIDAZOLAM HCL 2 MG/2ML IJ SOLN
INTRAMUSCULAR | Status: AC
  Filled 2018-05-26: qty 2

## 2018-05-26 MED ORDER — LIDOCAINE HCL (CARDIAC) PF 100 MG/5ML IV SOSY
PREFILLED_SYRINGE | INTRAVENOUS | Status: DC | PRN
  Administered 2018-05-26: 100 mg via INTRATRACHEAL

## 2018-05-26 MED ORDER — FENTANYL CITRATE (PF) 100 MCG/2ML IJ SOLN
INTRAMUSCULAR | Status: DC | PRN
  Administered 2018-05-26: 50 ug via INTRAVENOUS

## 2018-05-26 MED ORDER — SODIUM CHLORIDE 0.9 % IV SOLN
INTRAVENOUS | Status: DC
  Administered 2018-05-26: 1000 mL via INTRAVENOUS
  Administered 2018-05-26: 09:00:00 via INTRAVENOUS

## 2018-05-26 NOTE — Anesthesia Post-op Follow-up Note (Signed)
Anesthesia QCDR form completed.        

## 2018-05-26 NOTE — Op Note (Signed)
[facility] Gastroenterology Patient Name: Victoria Greene Procedure Date: 05/26/2018 9:02 AM MRN: [id number] Account #: [id number] Date of Birth: [birth year] Admit Type: Outpatient Age: 52 Room: [facility] ENDO ROOM 3 Gender: Female Note Status: Finalized Procedure:            Upper GI endoscopy Indications:          Heartburn, Suspected gastro-esophageal reflux disease Providers:            Lin Landsman MD, MD Referring MD:         Alanda Slim. Defrancesco, MD (Referring MD) Medicines:            Monitored Anesthesia Care Complications:        No immediate complications. Estimated blood loss: None. Procedure:            Pre-Anesthesia Assessment:                       - Prior to the procedure, a History and Physical was                        performed, and patient medications and allergies were                        reviewed. The patient is competent. The risks and                        benefits of the procedure and the sedation options and                        risks were discussed with the patient. All questions                        were answered and informed consent was obtained.                        Patient identification and proposed procedure were                        verified by the physician, the nurse, the                        anesthesiologist, the anesthetist and the technician in                        the pre-procedure area in the procedure room. Mental                        Status Examination: alert and oriented. Airway                        Examination: normal oropharyngeal airway and neck                        mobility. Respiratory Examination: clear to                        auscultation. CV Examination: normal. Prophylactic                        Antibiotics: The patient does not require prophylactic  antibiotics. Prior Anticoagulants: The patient has                        taken no previous anticoagulant or  antiplatelet agents.                        ASA Grade Assessment: II - A patient with mild systemic                        disease. After reviewing the risks and benefits, the                        patient was deemed in satisfactory condition to undergo                        the procedure. The anesthesia plan was to use monitored                        anesthesia care (MAC). Immediately prior to                        administration of medications, the patient was                        re-assessed for adequacy to receive sedatives. The                        heart rate, respiratory rate, oxygen saturations, blood                        pressure, adequacy of pulmonary ventilation, and                        response to care were monitored throughout the                        procedure. The physical status of the patient was                        re-assessed after the procedure.                       After obtaining informed consent, the endoscope was                        passed under direct vision. Throughout the procedure,                        the patient's blood pressure, pulse, and oxygen                        saturations were monitored continuously. The Endoscope                        was introduced through the mouth, and advanced to the                        second part of duodenum. The upper GI endoscopy was  accomplished without difficulty. The patient tolerated                        the procedure well. Findings:      The duodenal bulb and second portion of the duodenum were normal.      The entire examined stomach was normal.      The cardia and gastric fundus were normal on retroflexion.      Esophagogastric landmarks were identified: the gastroesophageal junction       was found at 37 cm from the incisors.      The gastroesophageal junction and examined esophagus were normal. Impression:           - Normal duodenal bulb and second portion of the                         duodenum.                       - Normal stomach.                       - Esophagogastric landmarks identified.                       - Normal gastroesophageal junction and esophagus.                       - No specimens collected. Recommendation:       - Discharge patient to home (with parent).                       - Resume regular diet today.                       - Continue present medications.                       - Follow an antireflux regimen. Procedure Code(s):    --- Professional ---                       [id number], Esophagogastroduodenoscopy, flexible, transoral;                        diagnostic, including collection of specimen(s) by                        brushing or washing, when performed (separate procedure) Diagnosis Code(s):    --- Professional ---                       R12, Heartburn CPT copyright 2017 American Medical Association. All rights reserved. The codes documented in this report are preliminary and upon coder review may  be revised to meet current compliance requirements. Dr. Ulyess Mort Lin Landsman MD, MD 05/26/2018 9:24:01 AM This report has been signed electronically. Number of Addenda: 0 Note Initiated On: 05/26/2018 9:02 AM      [facility]

## 2018-05-26 NOTE — Anesthesia Postprocedure Evaluation (Signed)
Anesthesia Post Note  Patient: DANEILLE DESILVA  Procedure(s) Performed: ESOPHAGOGASTRODUODENOSCOPY (EGD) WITH PROPOFOL (N/A ) COLONOSCOPY WITH PROPOFOL (N/A )  Patient location during evaluation: Endoscopy Anesthesia Type: General Level of consciousness: awake and alert Pain management: pain level controlled Vital Signs Assessment: post-procedure vital signs reviewed and stable Respiratory status: spontaneous breathing, nonlabored ventilation, respiratory function stable and patient connected to nasal cannula oxygen Cardiovascular status: blood pressure returned to baseline and stable Postop Assessment: no apparent nausea or vomiting Anesthetic complications: no     Last Vitals:  Vitals:   05/26/18 0959 05/26/18 1009  BP: 115/75 120/73  Pulse: (!) 58 (!) 59  Resp: 17 16  Temp:    SpO2: 100% 100%    Last Pain:  Vitals:   05/26/18 1009  TempSrc:   PainSc: 0-No pain                 Precious Haws Jaclynne Baldo

## 2018-05-26 NOTE — Anesthesia Procedure Notes (Signed)
Date/Time: 05/26/2018 9:15 AM Performed by: Allean Found, CRNA Pre-anesthesia Checklist: Patient identified, Emergency Drugs available, Suction available, Patient being monitored and Timeout performed Patient Re-evaluated:Patient Re-evaluated prior to induction Oxygen Delivery Method: Nasal cannula Placement Confirmation: positive ETCO2

## 2018-05-26 NOTE — Op Note (Signed)
[facility] Gastroenterology Patient Name: Victoria Greene Procedure Date: 05/26/2018 8:59 AM MRN: [id number] Account #: [id number] Date of Birth: 12-Nov-1965 Admit Type: Outpatient Age: 52 Room: [facility] ENDO ROOM 3 Gender: Female Note Status: Finalized Procedure:            Colonoscopy Indications:          Screening for colorectal malignant neoplasm, This is                        the patient's first colonoscopy Providers:            Lin Landsman MD, MD Referring MD:         Alanda Slim. Defrancesco, MD (Referring MD) Medicines:            Monitored Anesthesia Care Complications:        No immediate complications. Estimated blood loss: None. Procedure:            Pre-Anesthesia Assessment:                       - Prior to the procedure, a History and Physical was                        performed, and patient medications and allergies were                        reviewed. The patient is competent. The risks and                        benefits of the procedure and the sedation options and                        risks were discussed with the patient. All questions                        were answered and informed consent was obtained.                        Patient identification and proposed procedure were                        verified by the physician, the nurse, the                        anesthesiologist, the anesthetist and the technician in                        the pre-procedure area in the procedure room in the                        endoscopy suite. Mental Status Examination: alert and                        oriented. Airway Examination: normal oropharyngeal                        airway and neck mobility. Respiratory Examination:                        clear to auscultation. CV Examination: normal.  Prophylactic Antibiotics: The patient does not require                        prophylactic antibiotics. Prior Anticoagulants: The                 patient has taken no previous anticoagulant or                        antiplatelet agents. ASA Grade Assessment: II - A                        patient with mild systemic disease. After reviewing the                        risks and benefits, the patient was deemed in                        satisfactory condition to undergo the procedure. The                        anesthesia plan was to use monitored anesthesia care                        (MAC). Immediately prior to administration of                        medications, the patient was re-assessed for adequacy                        to receive sedatives. The heart rate, respiratory rate,                        oxygen saturations, blood pressure, adequacy of                        pulmonary ventilation, and response to care were                        monitored throughout the procedure. The physical status                        of the patient was re-assessed after the procedure.                       After obtaining informed consent, the colonoscope was                        passed under direct vision. Throughout the procedure,                        the patient's blood pressure, pulse, and oxygen                        saturations were monitored continuously. The                        Colonoscope was introduced through the anus and                        advanced to the the cecum, identified by appendiceal  orifice and ileocecal valve. The colonoscopy was                        performed without difficulty. The patient tolerated the                        procedure well. The quality of the bowel preparation                        was evaluated using the BBPS [facility] Bowel Preparation                        Scale) with scores of: Right Colon = 3, Transverse                        Colon = 3 and Left Colon = 3 (entire mucosa seen well                        with no residual staining, small fragments of  stool or                        opaque liquid). The total BBPS score equals 9. Findings:      The perianal and digital rectal examinations were normal. Pertinent       negatives include normal sphincter tone and no palpable rectal lesions.      Two sessile polyps were found in the transverse colon. The polyps were 3       to 5 mm in size. These polyps were removed with a cold snare. Resection       and retrieval were complete.      The exam was otherwise without abnormality.      The retroflexed view of the distal rectum and anal verge was normal and       showed no anal or rectal abnormalities. Impression:           - Two 3 to 5 mm polyps in the transverse colon, removed                        with a cold snare. Resected and retrieved.                       - The examination was otherwise normal.                       - The distal rectum and anal verge are normal on                        retroflexion view. Recommendation:       - Discharge patient to home (with parent).                       - Resume previous diet today.                       - Continue present medications.                       - Await pathology results.                       -  Repeat colonoscopy in 5 years for surveillance.                       - Return to my office as previously scheduled. Procedure Code(s):    --- Professional ---                       [phone], Colonoscopy, flexible; with removal of tumor(s),                        polyp(s), or other lesion(s) by snare technique Diagnosis Code(s):    --- Professional ---                       Z12.11, Encounter for screening for malignant neoplasm                        of colon                       D12.3, Benign neoplasm of transverse colon (hepatic                        flexure or splenic flexure) CPT copyright 2017 American Medical Association. All rights reserved. The codes documented in this report are preliminary and upon coder review may  be revised to  meet current compliance requirements. Dr. Ulyess Mort Lin Landsman MD, MD 05/26/2018 9:43:39 AM This report has been signed electronically. Number of Addenda: 0 Note Initiated On: 05/26/2018 8:59 AM Scope Withdrawal Time: 0 hours 10 minutes 51 seconds  Total Procedure Duration: 0 hours 14 minutes 45 seconds       [facility]

## 2018-05-26 NOTE — H&P (Signed)
Cephas Darby, MD [address]  [place]  [place], [place] [address]  Main: [phone]  Fax: [phone] Pager: [phone]  Primary Care Physician:  Patient, No Pcp Per Primary Gastroenterologist:  Dr. Cephas Darby  Pre-Procedure History & Physical: HPI:  Victoria Greene is a 52 y.o. female is here for an endoscopy and colonoscopy.   Past Medical History:  Diagnosis Date  . Breast cancer ([place]) 2010   Right breast cancer - radiation  . GERD (gastroesophageal reflux disease)   . Glaucoma   . Personal history of radiation therapy   . Urinary frequency   . Vaginal discharge     Past Surgical History:  Procedure Laterality Date  . BREAST BIOPSY Left 2010   stereotactic  . BREAST EXCISIONAL BIOPSY Right 2010   +  . BREAST LUMPECTOMY Right 2010  . BREAST LUMPECTOMY    . CESAREAN SECTION    . VAGINAL HYSTERECTOMY  2009   lsh    Prior to Admission medications   Medication Sig Start Date End Date Taking? Authorizing Provider  cyanocobalamin 1000 MCG tablet Take 1,000 mcg by mouth daily.   Yes [provider]  fluticasone (FLONASE) 50 MCG/ACT nasal spray Place into the nose. 03/20/17  Yes [provider]  Multiple Vitamin (MULTIVITAMIN) capsule Take 1 capsule by mouth daily.   Yes [provider]  omeprazole (PRILOSEC OTC) 20 MG tablet Take by mouth.   Yes [provider]    Allergies as of 04/30/2018 - Review Complete 04/30/2018  Allergen Reaction Noted  . Nickel Rash 04/24/2018  . Shellfish allergy Rash 12/31/2012  . Shellfish-derived products Rash 02/27/2016    Family History  Problem Relation Age of Onset  . Breast cancer Maternal Aunt 38  . Breast cancer Maternal Aunt 65  . Heart disease Father   . Thyroid disease Mother   . Pancreatic cancer Mother   . Ovarian cancer Neg Hx   . Colon cancer Neg Hx   . Diabetes Neg Hx     Social History   Socioeconomic History  . Marital status: Divorced    Spouse name: Not  on file  . Number of children: Not on file  . Years of education: Not on file  . Highest education level: Not on file  Occupational History  . Not on file  Social Needs  . Financial resource strain: Not on file  . Food insecurity:    Worry: Not on file    Inability: Not on file  . Transportation needs:    Medical: Not on file    Non-medical: Not on file  Tobacco Use  . Smoking status: Never Smoker  . Smokeless tobacco: Never Used  Substance and Sexual Activity  . Alcohol use: Yes    Comment: occas  . Drug use: No  . Sexual activity: Not Currently    Birth control/protection: None  Lifestyle  . Physical activity:    Days per week: 3 days    Minutes per session: 60 min  . Stress: Not on file  Relationships  . Social connections:    Talks on phone: Not on file    Gets together: Not on file    Attends religious service: Not on file    Active member of club or organization: Not on file    Attends meetings of clubs or organizations: Not on file    Relationship status: Not on file  . Intimate partner violence:    Fear of current or  ex partner: Not on file    Emotionally abused: Not on file    Physically abused: Not on file    Forced sexual activity: Not on file  Other Topics Concern  . Not on file  Social History Narrative  . Not on file    Review of Systems: See HPI, otherwise negative ROS  Physical Exam: BP 130/86   Pulse 63   Resp 16   Ht 5\' 1"  (1.549 m)   Wt 67.6 kg   SpO2 100%   BMI 28.15 kg/m  General:   Alert,  pleasant and cooperative in NAD Head:  Normocephalic and atraumatic. Neck:  Supple; no masses or thyromegaly. Lungs:  Clear throughout to auscultation.    Heart:  Regular rate and rhythm. Abdomen:  Soft, nontender and nondistended. Normal bowel sounds, without guarding, and without rebound.   Neurologic:  Alert and  oriented x4;  grossly normal neurologically.  Impression/Plan: Victoria Greene is here for an endoscopy and colonoscopy to be  performed for chronic GERD and colon cancer screening  Risks, benefits, limitations, and alternatives regarding  endoscopy and colonoscopy have been reviewed with the patient.  Questions have been answered.  All parties agreeable.   Sherri Sear, MD  05/26/2018, 8:16 AM

## 2018-05-26 NOTE — Anesthesia Preprocedure Evaluation (Signed)
Anesthesia Evaluation  Patient identified by MRN, date of birth, ID band Patient awake    Reviewed: Allergy & Precautions, H&P , NPO status , Patient's Chart, lab work & pertinent test results  History of Anesthesia Complications Negative for: history of anesthetic complications  Airway Mallampati: II  TM Distance: >3 FB Neck ROM: full    Dental  (+) Chipped   Pulmonary neg pulmonary ROS, neg shortness of breath,           Cardiovascular Exercise Tolerance: Good (-) angina(-) Past MI and (-) DOE negative cardio ROS       Neuro/Psych negative neurological ROS  negative psych ROS   GI/Hepatic Neg liver ROS, GERD  Medicated and Controlled,  Endo/Other  negative endocrine ROS  Renal/GU negative Renal ROS  negative genitourinary   Musculoskeletal   Abdominal   Peds  Hematology negative hematology ROS (+)   Anesthesia Other Findings Past Medical History: 2010: Breast cancer ([place])     Comment:  Right breast cancer - radiation No date: GERD (gastroesophageal reflux disease) No date: Glaucoma No date: Personal history of radiation therapy No date: Urinary frequency No date: Vaginal discharge  Past Surgical History: 2010: BREAST BIOPSY; Left     Comment:  stereotactic 2010: BREAST EXCISIONAL BIOPSY; Right     Comment:  + 2010: BREAST LUMPECTOMY; Right No date: BREAST LUMPECTOMY No date: CESAREAN SECTION 2009: VAGINAL HYSTERECTOMY     Comment:  lsh  BMI    Body Mass Index:  28.15 kg/m      Reproductive/Obstetrics negative OB ROS                             Anesthesia Physical Anesthesia Plan  ASA: III  Anesthesia Plan: General   Post-op Pain Management:    Induction: Intravenous  PONV Risk Score and Plan: Propofol infusion and TIVA  Airway Management Planned: Natural Airway and Nasal Cannula  Additional Equipment:   Intra-op Plan:   Post-operative Plan:    Informed Consent: I have reviewed the patients History and Physical, chart, labs and discussed the procedure including the risks, benefits and alternatives for the proposed anesthesia with the patient or authorized representative who has indicated his/her understanding and acceptance.   Dental Advisory Given  Plan Discussed with: Anesthesiologist, CRNA and Surgeon  Anesthesia Plan Comments: (Patient consented for risks of anesthesia including but not limited to:  - adverse reactions to medications - risk of intubation if required - damage to teeth, lips or other oral mucosa - sore throat or hoarseness - Damage to heart, brain, lungs or loss of life  Patient voiced understanding.)        Anesthesia Quick Evaluation

## 2018-05-26 NOTE — Transfer of Care (Signed)
Immediate Anesthesia Transfer of Care Note  Patient: Victoria Greene  Procedure(s) Performed: ESOPHAGOGASTRODUODENOSCOPY (EGD) WITH PROPOFOL (N/A ) COLONOSCOPY WITH PROPOFOL (N/A )  Patient Location: PACU  Anesthesia Type:General  Level of Consciousness: sedated  Airway & Oxygen Therapy: Patient Spontanous Breathing and Patient connected to nasal cannula oxygen  Post-op Assessment: Report given to RN and Post -op Vital signs reviewed and stable  Post vital signs: Reviewed and stable  Last Vitals:  Vitals Value Taken Time  BP 139/76 05/26/2018  9:49 AM  Temp 36.1 C 05/26/2018  9:49 AM  Pulse 60 05/26/2018  9:58 AM  Resp 16 05/26/2018  9:58 AM  SpO2 100 % 05/26/2018  9:58 AM  Vitals shown include unvalidated device data.  Last Pain:  Vitals:   05/26/18 0949  TempSrc: Tympanic  PainSc:          Complications: No apparent anesthesia complications

## 2018-05-28 LAB — SURGICAL PATHOLOGY

## 2018-05-30 ENCOUNTER — Encounter: Payer: Self-pay | Admitting: Gastroenterology

## 2018-06-05 ENCOUNTER — Ambulatory Visit (INDEPENDENT_AMBULATORY_CARE_PROVIDER_SITE_OTHER): Payer: Managed Care, Other (non HMO)

## 2018-06-05 ENCOUNTER — Encounter: Payer: Self-pay | Admitting: Obstetrics and Gynecology

## 2018-06-05 ENCOUNTER — Ambulatory Visit (INDEPENDENT_AMBULATORY_CARE_PROVIDER_SITE_OTHER): Payer: Managed Care, Other (non HMO) | Admitting: Obstetrics and Gynecology

## 2018-06-05 VITALS — BP 128/82 | HR 67 | Ht 61.0 in | Wt 149.2 lb

## 2018-06-05 DIAGNOSIS — Z90710 Acquired absence of both cervix and uterus: Secondary | ICD-10-CM | POA: Diagnosis not present

## 2018-06-05 DIAGNOSIS — R102 Pelvic and perineal pain: Secondary | ICD-10-CM | POA: Diagnosis not present

## 2018-06-05 DIAGNOSIS — Z853 Personal history of malignant neoplasm of breast: Secondary | ICD-10-CM | POA: Diagnosis not present

## 2018-06-05 DIAGNOSIS — N83299 Other ovarian cyst, unspecified side: Secondary | ICD-10-CM

## 2018-06-05 DIAGNOSIS — R1031 Right lower quadrant pain: Secondary | ICD-10-CM | POA: Diagnosis not present

## 2018-06-05 NOTE — Patient Instructions (Signed)
1.  FSH and Roma score are obtained today. 2.  Laparoscopic bilateral oophorectomy is scheduled. 3.  Return the week before surgery for preop appointment.

## 2018-06-05 NOTE — Progress Notes (Signed)
Chief complaint: 1.  Pelvic pain 2.  Follow-up on pelvic ultrasound 3.  History of left ovarian cyst 4.  Family history of pancreatic cancer 5.  Personal history of breast cancer  Victoria Greene presents today for follow-up on pelvic ultrasound done for assessment of ovarian cyst identified during work-up for pelvic pain.  She still has chronic bilateral lower pelvic discomfort, thought to be bowel related.  However, she still worries about potential ovarian cancer risk because of her personal history of breast cancer, and her family history of pancreatic cancer in mom.  Because the pain is persistent she is wanting to proceed with definitive surgery.  Pelvic ultrasound today demonstrates a complex left ovarian cyst and a normal right ovary.  Patient is not experiencing any significant vasomotor symptoms.  She is status post [facility]  Polyp does not report any pelvic pain with intercourse or abnormal uterine bleeding.  Past Medical History:  Diagnosis Date  . Breast cancer ([place]) 2010   Right breast cancer - radiation  . GERD (gastroesophageal reflux disease)   . Glaucoma   . Personal history of radiation therapy   . Urinary frequency   . Vaginal discharge    Past Surgical History:  Procedure Laterality Date  . BREAST BIOPSY Left 2010   stereotactic  . BREAST EXCISIONAL BIOPSY Right 2010   +  . BREAST LUMPECTOMY Right 2010  . BREAST LUMPECTOMY    . CESAREAN SECTION    . COLONOSCOPY WITH PROPOFOL N/A 05/26/2018   Procedure: COLONOSCOPY WITH PROPOFOL;  Surgeon: Lin Landsman, MD;  Location[facility] ENDOSCOPY;  Service: Gastroenterology;  Laterality: N/A;  . ESOPHAGOGASTRODUODENOSCOPY (EGD) WITH PROPOFOL N/A 05/26/2018   Procedure: ESOPHAGOGASTRODUODENOSCOPY (EGD) WITH PROPOFOL;  Surgeon: Lin Landsman, MD;  Location: [facility] ENDOSCOPY;  Service: Gastroenterology;  Laterality: N/A;  . VAGINAL HYSTERECTOMY  2009   lsh   Review of systems: Complete review of systems is notable for positive  findings in the HPI only  OBJECTIVE: BP 128/82   Pulse 67   Ht 5\' 1"  (1.549 m)   Wt 149 lb 3.2 oz (67.7 kg)   BMI 28.19 kg/m  Pleasant female no acute distress.  Alert and oriented. Back: No CVA tenderness Abdomen: Soft, nontender without organomegaly; midline incision is well healed without hernia; no palpable pelvic mass Pelvic exam: External genitalia-normal BUS-normal Vagina-good estrogen effect; excellent vaginal vault support; no discharge; introitus snug Cervix-well supported high in the vagina  Uterus-surgically absent Adnexa-nonpalpable and nontender Rectovaginal-normal external exam: Internal exam deferred Extremities: Warm and dry without edema  ASSESSMENT: 1.  Chronic pelvic pain 2.  Complex left ovarian cyst 3.  Status post cesarean section x2; status post [place] 4.  History of breast cancer, currently NED 5.  Family history of pancreatic cancer  PLAN: 1.  Management options are reviewed, both conservative medical and surgical. 2.  FSH and Roma score ordered 3.  Schedule laparoscopic BSO in 3 to 4 weeks 4.  Return week before appointment for preop appointment  A total of 15 minutes were spent face-to-face with the patient during this encounter and over half of that time dealt with counseling and coordination of care.  Brayton Mars, MD  Note: This dictation was prepared with Dragon dictation along with smaller phrase technology. Any transcriptional errors that result from this process are unintentional.

## 2018-06-06 ENCOUNTER — Ambulatory Visit: Admit: 2018-06-06 | Discharge: 2018-06-07 | Payer: PRIVATE HEALTH INSURANCE

## 2018-06-06 DIAGNOSIS — H3581 Retinal edema: Principal | ICD-10-CM

## 2018-06-10 LAB — POSTMENOPAUSAL INTERP: LOW

## 2018-06-10 LAB — OVARIAN MALIGNANCY RISK-ROMA
CANCER ANTIGEN (CA) 125: 24.5 U/mL (ref 0.0–38.1)
HE4 (HUMAN EPID PROT 4): 45.2 pmol/L (ref 0.0–63.6)
Postmenopausal ROMA: 1.44
Premenopausal ROMA: 0.61

## 2018-06-10 LAB — PREMENOPAUSAL INTERP: LOW

## 2018-06-10 LAB — FOLLICLE STIMULATING HORMONE: FSH: 76 m[IU]/mL

## 2018-06-18 ENCOUNTER — Encounter: Payer: Self-pay | Admitting: Obstetrics and Gynecology

## 2018-06-18 ENCOUNTER — Ambulatory Visit (INDEPENDENT_AMBULATORY_CARE_PROVIDER_SITE_OTHER): Payer: Managed Care, Other (non HMO) | Admitting: Obstetrics and Gynecology

## 2018-06-18 VITALS — BP 107/72 | HR 65 | Ht 61.0 in | Wt 148.5 lb

## 2018-06-18 DIAGNOSIS — Z01818 Encounter for other preprocedural examination: Secondary | ICD-10-CM

## 2018-06-18 DIAGNOSIS — Z90710 Acquired absence of both cervix and uterus: Secondary | ICD-10-CM

## 2018-06-18 DIAGNOSIS — N83299 Other ovarian cyst, unspecified side: Secondary | ICD-10-CM

## 2018-06-18 DIAGNOSIS — Z800 Family history of malignant neoplasm of digestive organs: Secondary | ICD-10-CM

## 2018-06-18 DIAGNOSIS — Z853 Personal history of malignant neoplasm of breast: Secondary | ICD-10-CM

## 2018-06-18 NOTE — H&P (View-Only) (Signed)
PREOPERATIVE HISTORY AND PHYSICAL  Date of surgery: 06/23/2018 Diagnosis: 1.  Complex left ovarian cyst 2.  Pelvic pain 3.  History of breast cancer   Patient is a 52 y.o. G2P2043fmale scheduled for surgery on 06/23/2018.  She is to undergo laparoscopic BSO for management of complex ovarian cyst and pelvic pain. She still has chronic bilateral lower pelvic discomfort, thought to be bowel related.  However, she still worries about potential ovarian cancer risk because of her personal history of breast cancer, and her family history of pancreatic cancer in mom.  Because the pain is persistent she is wanting to proceed with definitive surgery. Pelvic ultrasound today demonstrates a complex left ovarian cyst and a normal right ovary. Patient is not experiencing any significant vasomotor symptoms.  [facility] Roma score is low risk for ovarian cancer.  Gynecologic History No LMP recorded. Status post [place]: status post hysterectomy Last Pap: 04/2017 neg/neg.  Last mammogram: 07/05/2017 birad 2.  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 Patient has had a hysterectomy.    Past Medical History:  Diagnosis Date  . Breast cancer ([place] 2010   Right breast cancer - radiation  . GERD (gastroesophageal reflux disease)   . Glaucoma   . Personal history of radiation therapy   . Urinary frequency   . Vaginal discharge     Past Surgical History:  Procedure Laterality Date  . BREAST BIOPSY Left 2010   stereotactic  . BREAST EXCISIONAL BIOPSY Right 2010   +  . BREAST LUMPECTOMY Right 2010  . BREAST LUMPECTOMY    . CESAREAN SECTION    . COLONOSCOPY WITH PROPOFOL N/A 05/26/2018   Procedure:  COLONOSCOPY WITH PROPOFOL;  Surgeon: VLin Landsman MD;  [facility]  Service: Gastroenterology;  Laterality: N/A;  . ESOPHAGOGASTRODUODENOSCOPY (EGD) WITH PROPOFOL N/A 05/26/2018   Procedure: ESOPHAGOGASTRODUODENOSCOPY (EGD) WITH PROPOFOL;  Surgeon: VLin Landsman MD;  [facility];  Service: Gastroenterology;  Laterality: N/A;  . VAGINAL HYSTERECTOMY  2009   lsh    OB History  Gravida Para Term Preterm AB Living  _0 SAB TAB Ectopic Multiple Live Births          1    # Outcome Date GA Lbr Len/2nd Weight Sex Delivery Anes PTL Lv  2 Term 1998   9 lb (4.082 kg) M CS-LTranv   LIV  1 Term 1991   7 lb 2.6 oz (3.248 kg) M CS-LTranv       Social History   Socioeconomic History  . Marital status: Divorced    Spouse name: Not on file  . Number of children: Not on file  . Years of education: Not on file  . Highest education level: Not on file  Occupational History  . Not on file  Social Needs  . Financial resource strain: Not on file  . Food insecurity:  Worry: Not on file    Inability: Not on file  . Transportation needs:    Medical: Not on file    Non-medical: Not on file  Tobacco Use  . Smoking status: Never Smoker  . Smokeless tobacco: Never Used  Substance and Sexual Activity  . Alcohol use: Yes    Comment: occas  . Drug use: No  . Sexual activity: Not Currently    Birth control/protection: None  Lifestyle  . Physical activity:    Days per week: 3 days    Minutes per session: 60 min  . Stress: Not on file  Relationships  . Social connections:    Talks on phone: Not on file    Gets together: Not on file    Attends religious service: Not on file    Active member of club or organization: Not on file    Attends meetings of clubs or organizations: Not on file    Relationship status: Not on file  Other Topics Concern  . Not on file  Social History Narrative  . Not on file    Family History  Problem Relation Age of  Onset  . Breast cancer Maternal Aunt 38  . Breast cancer Maternal Aunt 46  . Heart disease Father   . Thyroid disease Mother   . Pancreatic cancer Mother   . Ovarian cancer Neg Hx   . Colon cancer Neg Hx   . Diabetes Neg Hx      (Not in a hospital admission)  Allergies  Allergen Reactions  . Nickel Rash  . Shellfish Allergy Rash  . Shellfish-Derived Products Rash    Review of Systems Constitutional: No recent fever/chills/sweats Respiratory: No recent cough/bronchitis Cardiovascular: No chest pain Gastrointestinal: No recent nausea/vomiting/diarrhea Genitourinary: No UTI symptoms Hematologic/lymphatic:No history of coagulopathy or recent blood thinner use    Objective:    BP 107/72   Pulse 65   Ht 5' 1" (1.549 m)   Wt 148 lb 8 oz (67.4 kg)   BMI 28.06 kg/m   General:   Normal  Skin:   normal  HEENT:  Normal  Neck:  Supple without Adenopathy or Thyromegaly  Lungs:   Heart:              Breasts:   Abdomen:  Pelvis:  M/S   Extremeties:  Neuro:    clear to auscultation bilaterally   Normal without murmur   Not Examined   soft, non-tender; bowel sounds normal; no masses,  no organomegaly   Exam deferred to OR  No CVAT  Warm/Dry   Normal        10/3/Pelvic exam: External genitalia-normal BUS-normal Vagina-good estrogen effect; excellent vaginal vault support; no discharge; introitus snug Cervix-well supported high in the vagina  Uterus-surgically absent Adnexa-nonpalpable and nontender Rectovaginal-normal external exam: Internal exam deferred   Assessment:     1.  Complex left ovarian cyst 2.  Chronic pelvic pain 3.  History of breast cancer 4.  Menopausal with Roma score demonstrating low risk for malignant potential   Plan:  Laparoscopic BSO   Preop counseling: Patient is to undergo laparoscopic BSO for management of complex left ovarian cyst and pelvic pain on 06/23/2018.  She is understanding of the planned procedures and is aware of  and is accepting of all surgical risks which include but are not limited to bleeding, infection, pelvic organ injury with need for repair, blood clot disorders, anesthesia risk, etc.  All questions have been answered.  Informed consent is   given.  Patient is ready willing to proceed with surgery as scheduled.  Brayton Mars, MD  Note: This dictation was prepared with Dragon dictation along with smaller phrase technology. Any transcriptional errors that result from this process are unintentional.

## 2018-06-18 NOTE — Patient Instructions (Addendum)
1.  Preop appointment is completed today. 2.  Return on 07/08/2018 for postop check

## 2018-06-18 NOTE — Progress Notes (Signed)
PREOPERATIVE HISTORY AND PHYSICAL  Date of surgery: 06/23/2018 Diagnosis: 1.  Complex left ovarian cyst 2.  Pelvic pain 3.  History of breast cancer   Patient is a 52 y.o. G2P2043fmale scheduled for surgery on 06/23/2018.  She is to undergo laparoscopic BSO for management of complex ovarian cyst and pelvic pain. She still has chronic bilateral lower pelvic discomfort, thought to be bowel related.  However, she still worries about potential ovarian cancer risk because of her personal history of breast cancer, and her family history of pancreatic cancer in mom.  Because the pain is persistent she is wanting to proceed with definitive surgery. Pelvic ultrasound today demonstrates a complex left ovarian cyst and a normal right ovary. Patient is not experiencing any significant vasomotor symptoms.  [facility] Roma score is low risk for ovarian cancer.  Gynecologic History No LMP recorded. Status post [place]raception: status post hysterectomy Last Pap: 04/2017 neg/neg.  Last mammogram: 07/05/2017 birad 2.  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 Patient has had a hysterectomy.    Past Medical History:  Diagnosis Date  . Breast cancer ([place] 2010   Right breast cancer - radiation  . GERD (gastroesophageal reflux disease)   . Glaucoma   . Personal history of radiation therapy   . Urinary frequency   . Vaginal discharge     Past Surgical History:  Procedure Laterality Date  . BREAST BIOPSY Left 2010   stereotactic  . BREAST EXCISIONAL BIOPSY Right 2010   +  . BREAST LUMPECTOMY Right 2010  . BREAST LUMPECTOMY    . CESAREAN SECTION    . COLONOSCOPY WITH PROPOFOL N/A 05/26/2018   Procedure:  COLONOSCOPY WITH PROPOFOL;  Surgeon: VLin Landsman MD;  [facility];  Service: Gastroenterology;  Laterality: N/A;  . ESOPHAGOGASTRODUODENOSCOPY (EGD) WITH PROPOFOL N/A 05/26/2018   Procedure: ESOPHAGOGASTRODUODENOSCOPY (EGD) WITH PROPOFOL;  Surgeon: VLin Landsman MD;  [facility];  Service: Gastroenterology;  Laterality: N/A;  . VAGINAL HYSTERECTOMY  2009   lsh    OB History  Gravida Para Term Preterm AB Living  _0 SAB TAB Ectopic Multiple Live Births          1    # Outcome Date GA Lbr Len/2nd Weight Sex Delivery Anes PTL Lv  2 Term 1998   9 lb (4.082 kg) M CS-LTranv   LIV  1 Term 1991   7 lb 2.6 oz (3.248 kg) M CS-LTranv       Social History   Socioeconomic History  . Marital status: Divorced    Spouse name: Not on file  . Number of children: Not on file  . Years of education: Not on file  . Highest education level: Not on file  Occupational History  . Not on file  Social Needs  . Financial resource strain: Not on file  . Food insecurity:  Worry: Not on file    Inability: Not on file  . Transportation needs:    Medical: Not on file    Non-medical: Not on file  Tobacco Use  . Smoking status: Never Smoker  . Smokeless tobacco: Never Used  Substance and Sexual Activity  . Alcohol use: Yes    Comment: occas  . Drug use: No  . Sexual activity: Not Currently    Birth control/protection: None  Lifestyle  . Physical activity:    Days per week: 3 days    Minutes per session: 60 min  . Stress: Not on file  Relationships  . Social connections:    Talks on phone: Not on file    Gets together: Not on file    Attends religious service: Not on file    Active member of club or organization: Not on file    Attends meetings of clubs or organizations: Not on file    Relationship status: Not on file  Other Topics Concern  . Not on file  Social History Narrative  . Not on file    Family History  Problem Relation Age of  Onset  . Breast cancer Maternal Aunt 38  . Breast cancer Maternal Aunt 46  . Heart disease Father   . Thyroid disease Mother   . Pancreatic cancer Mother   . Ovarian cancer Neg Hx   . Colon cancer Neg Hx   . Diabetes Neg Hx      (Not in a hospital admission)  Allergies  Allergen Reactions  . Nickel Rash  . Shellfish Allergy Rash  . Shellfish-Derived Products Rash    Review of Systems Constitutional: No recent fever/chills/sweats Respiratory: No recent cough/bronchitis Cardiovascular: No chest pain Gastrointestinal: No recent nausea/vomiting/diarrhea Genitourinary: No UTI symptoms Hematologic/lymphatic:No history of coagulopathy or recent blood thinner use    Objective:    BP 107/72   Pulse 65   Ht 5' 1" (1.549 m)   Wt 148 lb 8 oz (67.4 kg)   BMI 28.06 kg/m   General:   Normal  Skin:   normal  HEENT:  Normal  Neck:  Supple without Adenopathy or Thyromegaly  Lungs:   Heart:              Breasts:   Abdomen:  Pelvis:  M/S   Extremeties:  Neuro:    clear to auscultation bilaterally   Normal without murmur   Not Examined   soft, non-tender; bowel sounds normal; no masses,  no organomegaly   Exam deferred to OR  No CVAT  Warm/Dry   Normal        10/3/Pelvic exam: External genitalia-normal BUS-normal Vagina-good estrogen effect; excellent vaginal vault support; no discharge; introitus snug Cervix-well supported high in the vagina  Uterus-surgically absent Adnexa-nonpalpable and nontender Rectovaginal-normal external exam: Internal exam deferred   Assessment:     1.  Complex left ovarian cyst 2.  Chronic pelvic pain 3.  History of breast cancer 4.  Menopausal with Roma score demonstrating low risk for malignant potential   Plan:  Laparoscopic BSO   Preop counseling: Patient is to undergo laparoscopic BSO for management of complex left ovarian cyst and pelvic pain on 06/23/2018.  She is understanding of the planned procedures and is aware of  and is accepting of all surgical risks which include but are not limited to bleeding, infection, pelvic organ injury with need for repair, blood clot disorders, anesthesia risk, etc.  All questions have been answered.  Informed consent is   given.  Patient is ready willing to proceed with surgery as scheduled.  Brayton Mars, MD  Note: This dictation was prepared with Dragon dictation along with smaller phrase technology. Any transcriptional errors that result from this process are unintentional.

## 2018-06-18 NOTE — H&P (Signed)
PREOPERATIVE HISTORY AND PHYSICAL  Date of surgery: 06/23/2018 Diagnosis: 1.  Complex left ovarian cyst 2.  Pelvic pain 3.  History of breast cancer   Patient is a 52 y.o. G2P2043fmale scheduled for surgery on 06/23/2018.  She is to undergo laparoscopic BSO for management of complex ovarian cyst and pelvic pain. She still has chronic bilateral lower pelvic discomfort, thought to be bowel related.  However, she still worries about potential ovarian cancer risk because of her personal history of breast cancer, and her family history of pancreatic cancer in mom.  Because the pain is persistent she is wanting to proceed with definitive surgery. Pelvic ultrasound today demonstrates a complex left ovarian cyst and a normal right ovary. Patient is not experiencing any significant vasomotor symptoms.  [facility] Roma score is low risk for ovarian cancer.  Gynecologic History No LMP recorded. Status post [place]raception: status post hysterectomy Last Pap: 04/2017 neg/neg.  Last mammogram: 07/05/2017 birad 2.  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 Patient has had a hysterectomy.    Past Medical History:  Diagnosis Date  . Breast cancer ([place]et 2010   Right breast cancer - radiation  . GERD (gastroesophageal reflux disease)   . Glaucoma   . Personal history of radiation therapy   . Urinary frequency   . Vaginal discharge     Past Surgical History:  Procedure Laterality Date  . BREAST BIOPSY Left 2010   stereotactic  . BREAST EXCISIONAL BIOPSY Right 2010   +  . BREAST LUMPECTOMY Right 2010  . BREAST LUMPECTOMY    . CESAREAN SECTION    . COLONOSCOPY WITH PROPOFOL N/A 05/26/2018   Procedure:  COLONOSCOPY WITH PROPOFOL;  Surgeon: VLin Landsman MD;  [facility]  Service: Gastroenterology;  Laterality: N/A;  . ESOPHAGOGASTRODUODENOSCOPY (EGD) WITH PROPOFOL N/A 05/26/2018   Procedure: ESOPHAGOGASTRODUODENOSCOPY (EGD) WITH PROPOFOL;  Surgeon: VLin Landsman MD;  [facility];  Service: Gastroenterology;  Laterality: N/A;  . VAGINAL HYSTERECTOMY  2009   lsh    OB History  Gravida Para Term Preterm AB Living  _0 SAB TAB Ectopic Multiple Live Births          1    # Outcome Date GA Lbr Len/2nd Weight Sex Delivery Anes PTL Lv  2 Term 1998   9 lb (4.082 kg) M CS-LTranv   LIV  1 Term 1991   7 lb 2.6 oz (3.248 kg) M CS-LTranv       Social History   Socioeconomic History  . Marital status: Divorced    Spouse name: Not on file  . Number of children: Not on file  . Years of education: Not on file  . Highest education level: Not on file  Occupational History  . Not on file  Social Needs  . Financial resource strain: Not on file  . Food insecurity:  Worry: Not on file    Inability: Not on file  . Transportation needs:    Medical: Not on file    Non-medical: Not on file  Tobacco Use  . Smoking status: Never Smoker  . Smokeless tobacco: Never Used  Substance and Sexual Activity  . Alcohol use: Yes    Comment: occas  . Drug use: No  . Sexual activity: Not Currently    Birth control/protection: None  Lifestyle  . Physical activity:    Days per week: 3 days    Minutes per session: 60 min  . Stress: Not on file  Relationships  . Social connections:    Talks on phone: Not on file    Gets together: Not on file    Attends religious service: Not on file    Active member of club or organization: Not on file    Attends meetings of clubs or organizations: Not on file    Relationship status: Not on file  Other Topics Concern  . Not on file  Social History Narrative  . Not on file    Family History  Problem Relation Age of  Onset  . Breast cancer Maternal Aunt 38  . Breast cancer Maternal Aunt 75  . Heart disease Father   . Thyroid disease Mother   . Pancreatic cancer Mother   . Ovarian cancer Neg Hx   . Colon cancer Neg Hx   . Diabetes Neg Hx      (Not in a hospital admission)  Allergies  Allergen Reactions  . Nickel Rash  . Shellfish Allergy Rash  . Shellfish-Derived Products Rash    Review of Systems Constitutional: No recent fever/chills/sweats Respiratory: No recent cough/bronchitis Cardiovascular: No chest pain Gastrointestinal: No recent nausea/vomiting/diarrhea Genitourinary: No UTI symptoms Hematologic/lymphatic:No history of coagulopathy or recent blood thinner use    Objective:    BP 107/72   Pulse 65   Ht _0  (1.549 m)   Wt 148 lb 8 oz (67.4 kg)   BMI 28.06 kg/m   General:   Normal  Skin:   normal  HEENT:  Normal  Neck:  Supple without Adenopathy or Thyromegaly  Lungs:   Heart:              Breasts:   Abdomen:  Pelvis:  M/S   Extremeties:  Neuro:    clear to auscultation bilaterally   Normal without murmur   Not Examined   soft, non-tender; bowel sounds normal; no masses,  no organomegaly   Exam deferred to OR  No CVAT  Warm/Dry   Normal        10/3/Pelvic exam: External genitalia-normal BUS-normal Vagina-good estrogen effect; excellent vaginal vault support; no discharge; introitus snug Cervix-well supported high in the vagina  Uterus-surgically absent Adnexa-nonpalpable and nontender Rectovaginal-normal external exam: Internal exam deferred   Assessment:     1.  Complex left ovarian cyst 2.  Chronic pelvic pain 3.  History of breast cancer 4.  Menopausal with Roma score demonstrating low risk for malignant potential   Plan:  Laparoscopic BSO   Preop counseling: Patient is to undergo laparoscopic BSO for management of complex left ovarian cyst and pelvic pain on 06/23/2018.  She is understanding of the planned procedures and is aware of  and is accepting of all surgical risks which include but are not limited to bleeding, infection, pelvic organ injury with need for repair, blood clot disorders, anesthesia risk, etc.  All questions have been answered.  Informed consent is  given.  Patient is ready willing to proceed with surgery as scheduled.  Brayton Mars, MD  Note: This dictation was prepared with Dragon dictation along with smaller phrase technology. Any transcriptional errors that result from this process are unintentional.

## 2018-06-19 ENCOUNTER — Encounter
Admission: RE | Admit: 2018-06-19 | Discharge: 2018-06-19 | Disposition: A | Discharge status: Home / Self Care | Payer: Managed Care, Other (non HMO) | Source: Ambulatory Visit | Attending: Obstetrics and Gynecology | Admitting: Obstetrics and Gynecology

## 2018-06-19 ENCOUNTER — Other Ambulatory Visit: Payer: Self-pay

## 2018-06-19 NOTE — Patient Instructions (Signed)
Your procedure is scheduled on: 06-23-18 MONDAY Report to Same Day Surgery 2nd floor medical mall [facility] Entrance-take elevator on left to 2nd floor.  Check in with surgery information desk.) To find out your arrival time please call [phone] between 1PM - 3PM on 06-20-18 FRIDAY  Remember: Instructions that are not followed completely may result in serious medical risk, up to and including death, or upon the discretion of your surgeon and anesthesiologist your surgery may need to be rescheduled.    _x___ 1. Do not eat food after midnight the night before your procedure. NO GUM OR CANDY AFTER MIDNIGHT.  You may drink clear liquids up to 2 hours before you are scheduled to arrive at the hospital for your procedure.  Do not drink clear liquids within 2 hours of your scheduled arrival to the hospital.  Clear liquids include  --Water or Apple juice without pulp  --Clear carbohydrate beverage such as ClearFast or Gatorade  --Black Coffee or Clear Tea (No milk, no creamers, do not add anything to the coffee or Tea   ____Ensure clear carbohydrate drink on the way to the hospital for bariatric patients  ____Ensure clear carbohydrate drink 3 hours before surgery for Dr Dwyane Luo patients if physician instructed.     __x__ 2. No Alcohol for 24 hours before or after surgery.   __x__3. No Smoking or e-cigarettes for 24 prior to surgery.  Do not use any chewable tobacco products for at least 6 hour prior to surgery   ____  4. Bring all medications with you on the day of surgery if instructed.    __x__ 5. Notify your doctor if there is any change in your medical condition     (cold, fever, infections).    x___6. On the morning of surgery brush your teeth with toothpaste and water.  You may rinse your mouth with mouth wash if you wish.  Do not swallow any toothpaste or mouthwash.   Do not wear jewelry, make-up, hairpins, clips or nail polish.  Do not wear lotions, powders, or perfumes.  You may wear deodorant.  Do not shave 48 hours prior to surgery. Men may shave face and neck.  Do not bring valuables to the hospital.    [facility] is not responsible for any belongings or valuables.               Contacts, dentures or bridgework may not be worn into surgery.  Leave your suitcase in the car. After surgery it may be brought to your room.  For patients admitted to the hospital, discharge time is determined by your treatment team.  _  Patients discharged the day of surgery will not be allowed to drive home.  You will need someone to drive you home and stay with you the night of your procedure.    Please read over the following fact sheets that you were given:   [facility] Preparing for Surgery   _x___ TAKE THE FOLLOWING MEDICATION THE MORNING OF SURGERY WITH A SMALL SIP OF WATER. These include:  1. CLARITIN  2.  3.  4.  5.  6.  ____Fleets enema or Magnesium Citrate as directed.   _x___ Use CHG Soap or sage wipes as directed on instruction sheet   ____ Use inhalers on the day of surgery and bring to hospital day of surgery  ____ Stop Metformin and Janumet 2 days prior to surgery.    ____ Take 1/2 of usual insulin dose the night before  surgery and none on the morning surgery.   ____ Follow recommendations from Cardiologist, Pulmonologist or PCP regarding stopping Aspirin, Coumadin, Plavix ,Eliquis, Effient, or Pradaxa, and Pletal.  X____Stop Anti-inflammatories such as Advil, Aleve, Ibuprofen, Motrin, Naproxen, Naprosyn, Goodies powders or aspirin products NOW-OK to take Tylenol    ____ Stop supplements until after surgery.     ____ Bring C-Pap to the hospital.

## 2018-06-20 ENCOUNTER — Encounter
Admission: RE | Admit: 2018-06-20 | Discharge: 2018-06-20 | Disposition: A | Discharge status: Home / Self Care | Payer: Managed Care, Other (non HMO) | Source: Ambulatory Visit | Attending: Obstetrics and Gynecology | Admitting: Obstetrics and Gynecology

## 2018-06-20 DIAGNOSIS — N83292 Other ovarian cyst, left side: Secondary | ICD-10-CM | POA: Diagnosis not present

## 2018-06-20 DIAGNOSIS — N8302 Follicular cyst of left ovary: Secondary | ICD-10-CM | POA: Diagnosis not present

## 2018-06-20 DIAGNOSIS — N801 Endometriosis of ovary: Secondary | ICD-10-CM | POA: Diagnosis present

## 2018-06-20 DIAGNOSIS — Z923 Personal history of irradiation: Secondary | ICD-10-CM | POA: Diagnosis not present

## 2018-06-20 DIAGNOSIS — Z01812 Encounter for preprocedural laboratory examination: Secondary | ICD-10-CM

## 2018-06-20 DIAGNOSIS — H409 Unspecified glaucoma: Secondary | ICD-10-CM | POA: Diagnosis not present

## 2018-06-20 DIAGNOSIS — Z853 Personal history of malignant neoplasm of breast: Secondary | ICD-10-CM | POA: Diagnosis not present

## 2018-06-20 DIAGNOSIS — G8929 Other chronic pain: Secondary | ICD-10-CM | POA: Diagnosis not present

## 2018-06-20 DIAGNOSIS — N838 Other noninflammatory disorders of ovary, fallopian tube and broad ligament: Secondary | ICD-10-CM | POA: Diagnosis not present

## 2018-06-20 DIAGNOSIS — N736 Female pelvic peritoneal adhesions (postinfective): Secondary | ICD-10-CM | POA: Diagnosis not present

## 2018-06-20 DIAGNOSIS — K219 Gastro-esophageal reflux disease without esophagitis: Secondary | ICD-10-CM | POA: Diagnosis not present

## 2018-06-20 LAB — CBC WITH DIFFERENTIAL/PLATELET
ABS IMMATURE GRANULOCYTES: 0.03 10*3/uL (ref 0.00–0.07)
Basophils Absolute: 0 10*3/uL (ref 0.0–0.1)
Basophils Relative: 0 %
Eosinophils Absolute: 0.2 10*3/uL (ref 0.0–0.5)
Eosinophils Relative: 2 %
HCT: 42.9 % (ref 36.0–46.0)
HEMOGLOBIN: 13.7 g/dL (ref 12.0–15.0)
Immature Granulocytes: 0 %
LYMPHS PCT: 17 %
Lymphs Abs: 1.6 10*3/uL (ref 0.7–4.0)
MCH: 28.4 pg (ref 26.0–34.0)
MCHC: 31.9 g/dL (ref 30.0–36.0)
MCV: 89 fL (ref 80.0–100.0)
MONO ABS: 0.7 10*3/uL (ref 0.1–1.0)
MONOS PCT: 7 %
NEUTROS ABS: 7.2 10*3/uL (ref 1.7–7.7)
Neutrophils Relative %: 74 %
PLATELETS: 182 10*3/uL (ref 150–400)
RBC: 4.82 MIL/uL (ref 3.87–5.11)
RDW: 13.6 % (ref 11.5–15.5)
WBC: 9.8 10*3/uL (ref 4.0–10.5)
nRBC: 0 % (ref 0.0–0.2)

## 2018-06-20 LAB — TYPE AND SCREEN
ABO/RH(D): O POS
Antibody Screen: NEGATIVE

## 2018-06-21 LAB — RPR: RPR: NONREACTIVE

## 2018-06-23 ENCOUNTER — Encounter
Admission: RE | Disposition: A | Discharge status: Home / Self Care | Payer: Self-pay | Source: Ambulatory Visit | Attending: Obstetrics and Gynecology

## 2018-06-23 ENCOUNTER — Ambulatory Visit: Payer: Managed Care, Other (non HMO) | Admitting: Certified Registered Nurse Anesthetist

## 2018-06-23 ENCOUNTER — Ambulatory Visit
Admission: RE | Admit: 2018-06-23 | Discharge: 2018-06-23 | Disposition: A | Discharge status: Home / Self Care | Payer: Managed Care, Other (non HMO) | Source: Ambulatory Visit | Attending: Obstetrics and Gynecology | Admitting: Obstetrics and Gynecology

## 2018-06-23 DIAGNOSIS — G8929 Other chronic pain: Secondary | ICD-10-CM | POA: Insufficient documentation

## 2018-06-23 DIAGNOSIS — R102 Pelvic and perineal pain: Secondary | ICD-10-CM | POA: Diagnosis not present

## 2018-06-23 DIAGNOSIS — N83299 Other ovarian cyst, unspecified side: Secondary | ICD-10-CM

## 2018-06-23 DIAGNOSIS — K219 Gastro-esophageal reflux disease without esophagitis: Secondary | ICD-10-CM | POA: Insufficient documentation

## 2018-06-23 DIAGNOSIS — N838 Other noninflammatory disorders of ovary, fallopian tube and broad ligament: Secondary | ICD-10-CM | POA: Insufficient documentation

## 2018-06-23 DIAGNOSIS — N8302 Follicular cyst of left ovary: Secondary | ICD-10-CM | POA: Insufficient documentation

## 2018-06-23 DIAGNOSIS — N801 Endometriosis of ovary: Secondary | ICD-10-CM | POA: Diagnosis not present

## 2018-06-23 DIAGNOSIS — Z853 Personal history of malignant neoplasm of breast: Secondary | ICD-10-CM | POA: Insufficient documentation

## 2018-06-23 DIAGNOSIS — N83292 Other ovarian cyst, left side: Secondary | ICD-10-CM

## 2018-06-23 DIAGNOSIS — N736 Female pelvic peritoneal adhesions (postinfective): Secondary | ICD-10-CM | POA: Insufficient documentation

## 2018-06-23 DIAGNOSIS — Z923 Personal history of irradiation: Secondary | ICD-10-CM | POA: Insufficient documentation

## 2018-06-23 DIAGNOSIS — H409 Unspecified glaucoma: Secondary | ICD-10-CM | POA: Insufficient documentation

## 2018-06-23 DIAGNOSIS — Z9889 Other specified postprocedural states: Secondary | ICD-10-CM

## 2018-06-23 HISTORY — PX: LAPAROSCOPIC BILATERAL SALPINGO OOPHERECTOMY: SHX5890

## 2018-06-23 LAB — ABO/RH: ABO/RH(D): O POS

## 2018-06-23 SURGERY — SALPINGO-OOPHORECTOMY, BILATERAL, LAPAROSCOPIC
Anesthesia: General | Site: Abdomen | Laterality: Bilateral

## 2018-06-23 MED ORDER — LACTATED RINGERS IV SOLN
INTRAVENOUS | Status: DC
  Administered 2018-06-23: 10:00:00 via INTRAVENOUS

## 2018-06-23 MED ORDER — LIDOCAINE HCL (PF) 2 % IJ SOLN
INTRAMUSCULAR | Status: AC
  Filled 2018-06-23: qty 10

## 2018-06-23 MED ORDER — FAMOTIDINE 20 MG PO TABS
20.0000 mg | ORAL_TABLET | Freq: Once | ORAL | Status: AC
  Administered 2018-06-23: 20 mg via ORAL

## 2018-06-23 MED ORDER — FENTANYL CITRATE (PF) 100 MCG/2ML IJ SOLN
INTRAMUSCULAR | Status: AC
  Filled 2018-06-23: qty 2

## 2018-06-23 MED ORDER — PROPOFOL 10 MG/ML IV BOLUS
INTRAVENOUS | Status: AC
  Filled 2018-06-23: qty 20

## 2018-06-23 MED ORDER — HYDROMORPHONE HCL 2 MG PO TABS: 1.0000 mg | ORAL_TABLET | ORAL | 0 refills | Status: DC | PRN

## 2018-06-23 MED ORDER — ROCURONIUM BROMIDE 50 MG/5ML IV SOLN
INTRAVENOUS | Status: AC
  Filled 2018-06-23: qty 1

## 2018-06-23 MED ORDER — DEXAMETHASONE SODIUM PHOSPHATE 10 MG/ML IJ SOLN
INTRAMUSCULAR | Status: AC
  Filled 2018-06-23: qty 1

## 2018-06-23 MED ORDER — KETOROLAC TROMETHAMINE 30 MG/ML IJ SOLN
INTRAMUSCULAR | Status: DC | PRN
  Administered 2018-06-23: 30 mg via INTRAVENOUS

## 2018-06-23 MED ORDER — MIDAZOLAM HCL 2 MG/2ML IJ SOLN
INTRAMUSCULAR | Status: AC
  Filled 2018-06-23: qty 2

## 2018-06-23 MED ORDER — MIDAZOLAM HCL 2 MG/2ML IJ SOLN
INTRAMUSCULAR | Status: DC | PRN
  Administered 2018-06-23: 2 mg via INTRAVENOUS

## 2018-06-23 MED ORDER — DEXAMETHASONE SODIUM PHOSPHATE 10 MG/ML IJ SOLN
INTRAMUSCULAR | Status: DC | PRN
  Administered 2018-06-23: 10 mg via INTRAVENOUS

## 2018-06-23 MED ORDER — ROCURONIUM BROMIDE 100 MG/10ML IV SOLN
INTRAVENOUS | Status: DC | PRN
  Administered 2018-06-23: 50 mg via INTRAVENOUS

## 2018-06-23 MED ORDER — SUGAMMADEX SODIUM 200 MG/2ML IV SOLN
INTRAVENOUS | Status: AC
  Filled 2018-06-23: qty 2

## 2018-06-23 MED ORDER — FAMOTIDINE 20 MG PO TABS
ORAL_TABLET | ORAL | Status: AC
  Administered 2018-06-23: 20 mg via ORAL
  Filled 2018-06-23: qty 1

## 2018-06-23 MED ORDER — LIDOCAINE HCL (CARDIAC) PF 100 MG/5ML IV SOSY
PREFILLED_SYRINGE | INTRAVENOUS | Status: DC | PRN
  Administered 2018-06-23: 60 mg via INTRAVENOUS

## 2018-06-23 MED ORDER — SUGAMMADEX SODIUM 200 MG/2ML IV SOLN
INTRAVENOUS | Status: DC | PRN
  Administered 2018-06-23: 130 mg via INTRAVENOUS

## 2018-06-23 MED ORDER — ONDANSETRON HCL 4 MG/2ML IJ SOLN
INTRAMUSCULAR | Status: AC
  Filled 2018-06-23: qty 2

## 2018-06-23 MED ORDER — FENTANYL CITRATE (PF) 100 MCG/2ML IJ SOLN
INTRAMUSCULAR | Status: DC | PRN
  Administered 2018-06-23 (×3): 50 ug via INTRAVENOUS

## 2018-06-23 MED ORDER — ONDANSETRON HCL 4 MG/2ML IJ SOLN
INTRAMUSCULAR | Status: DC | PRN
  Administered 2018-06-23: 4 mg via INTRAVENOUS

## 2018-06-23 MED ORDER — ONDANSETRON HCL 4 MG/2ML IJ SOLN: 4.0000 mg | Freq: Once | INTRAMUSCULAR | Status: DC | PRN

## 2018-06-23 MED ORDER — FENTANYL CITRATE (PF) 100 MCG/2ML IJ SOLN: 25.0000 ug | INTRAMUSCULAR | Status: DC | PRN

## 2018-06-23 MED ORDER — EPHEDRINE SULFATE 50 MG/ML IJ SOLN
INTRAMUSCULAR | Status: DC | PRN
  Administered 2018-06-23: 5 mg via INTRAVENOUS

## 2018-06-23 MED ORDER — IBUPROFEN 600 MG PO TABS: 600.0000 mg | ORAL_TABLET | Freq: Four times a day (QID) | ORAL | 0 refills | Status: DC

## 2018-06-23 MED ORDER — PROPOFOL 10 MG/ML IV BOLUS
INTRAVENOUS | Status: DC | PRN
  Administered 2018-06-23: 120 mg via INTRAVENOUS

## 2018-06-23 MED ORDER — LACTATED RINGERS IV SOLN: INTRAVENOUS | Status: DC

## 2018-06-23 SURGICAL SUPPLY — 39 items
ANCHOR TIS RET SYS 235ML (MISCELLANEOUS) ×3 IMPLANT
BLADE SURG SZ11 CARB STEEL (BLADE) ×3 IMPLANT
CANISTER SUCT 1200ML W/VALVE (MISCELLANEOUS) ×3 IMPLANT
CATH ROBINSON RED A/P 16FR (CATHETERS) ×3 IMPLANT
CHLORAPREP W/TINT 26ML (MISCELLANEOUS) ×3 IMPLANT
COVER WAND RF STERILE (DRAPES) ×3 IMPLANT
DERMABOND ADVANCED (GAUZE/BANDAGES/DRESSINGS)
DERMABOND ADVANCED .7 DNX12 (GAUZE/BANDAGES/DRESSINGS) IMPLANT
DRSG TEGADERM 2-3/8X2-3/4 SM (GAUZE/BANDAGES/DRESSINGS) ×9 IMPLANT
GLOVE BIO SURGEON STRL SZ8 (GLOVE) ×6 IMPLANT
GLOVE INDICATOR 8.0 STRL GRN (GLOVE) ×3 IMPLANT
GOWN STRL REUS W/ TWL LRG LVL3 (GOWN DISPOSABLE) ×2 IMPLANT
GOWN STRL REUS W/ TWL XL LVL3 (GOWN DISPOSABLE) ×1 IMPLANT
GOWN STRL REUS W/TWL LRG LVL3 (GOWN DISPOSABLE) ×4
GOWN STRL REUS W/TWL XL LVL3 (GOWN DISPOSABLE) ×2
GRASPER SUT TROCAR 14GX15 (MISCELLANEOUS) ×3 IMPLANT
IRRIGATION STRYKERFLOW (MISCELLANEOUS) IMPLANT
IRRIGATOR STRYKERFLOW (MISCELLANEOUS)
IV LACTATED RINGERS 1000ML (IV SOLUTION) ×3 IMPLANT
KIT PINK PAD W/HEAD ARE REST (MISCELLANEOUS) ×3
KIT PINK PAD W/HEAD ARM REST (MISCELLANEOUS) ×1 IMPLANT
KIT TURNOVER CYSTO (KITS) ×3 IMPLANT
LABEL OR SOLS (LABEL) ×3 IMPLANT
NS IRRIG 1000ML POUR BTL (IV SOLUTION) ×3 IMPLANT
NS IRRIG 500ML POUR BTL (IV SOLUTION) ×3 IMPLANT
PACK GYN LAPAROSCOPIC (MISCELLANEOUS) ×3 IMPLANT
PAD OB MATERNITY 4.3X12.25 (PERSONAL CARE ITEMS) ×3 IMPLANT
PAD PREP 24X41 OB/GYN DISP (PERSONAL CARE ITEMS) ×3 IMPLANT
POUCH ENDO CATCH 10MM SPEC (MISCELLANEOUS) IMPLANT
SCISSORS METZENBAUM CVD 33 (INSTRUMENTS) IMPLANT
SHEARS HARMONIC ACE PLUS 36CM (ENDOMECHANICALS) IMPLANT
SLEEVE ENDOPATH XCEL 5M (ENDOMECHANICALS) IMPLANT
SUT MNCRL 4-0 (SUTURE) ×2
SUT MNCRL 4-0 27XMFL (SUTURE) ×1
SUT VIC AB 0 UR5 27 (SUTURE) ×3 IMPLANT
SUTURE MNCRL 4-0 27XMF (SUTURE) ×1 IMPLANT
TROCAR ENDO BLADELESS 11MM (ENDOMECHANICALS) IMPLANT
TROCAR XCEL NON-BLD 5MMX100MML (ENDOMECHANICALS) ×3 IMPLANT
TUBING INSUFFLATION (TUBING) ×3 IMPLANT

## 2018-06-23 NOTE — Interval H&P Note (Signed)
History and Physical Interval Note:  06/23/2018 9:39 AM  Victoria Greene  has presented today for surgery, with the diagnosis of COMPLEX OVARIAN CYST, PELVIC PAIN,S/P LSH, H/O BREAST CANCER  The various methods of treatment have been discussed with the patient and family. After consideration of risks, benefits and other options for treatment, the patient has consented to  Procedure(s): LAPAROSCOPIC BILATERAL SALPINGO OOPHORECTOMY (Bilateral) as a surgical intervention .  The patient's history has been reviewed, patient examined, no change in status, stable for surgery.  I have reviewed the patient's chart and labs.  Questions were answered to the patient's satisfaction.     Hassell Done A Kadon Andrus

## 2018-06-23 NOTE — Anesthesia Procedure Notes (Signed)
Procedure Name: Intubation Date/Time: 06/23/2018 10:32 AM Performed by: Eben Burow, CRNA Pre-anesthesia Checklist: Patient identified, Emergency Drugs available, Suction available, Patient being monitored and Timeout performed Patient Re-evaluated:Patient Re-evaluated prior to induction Oxygen Delivery Method: Circle system utilized Preoxygenation: Pre-oxygenation with 100% oxygen Induction Type: IV induction Ventilation: Mask ventilation without difficulty Laryngoscope Size: Mac and 3 Grade View: Grade II Tube type: Oral Tube size: 7.0 mm Number of attempts: 1 Airway Equipment and Method: Stylet and LTA kit utilized Placement Confirmation: ETT inserted through vocal cords under direct vision,  positive ETCO2 and breath sounds checked- equal and bilateral Secured at: 21 cm Tube secured with: Tape Dental Injury: Teeth and Oropharynx as per pre-operative assessment

## 2018-06-23 NOTE — Anesthesia Post-op Follow-up Note (Signed)
Anesthesia QCDR form completed.        

## 2018-06-23 NOTE — OR Nursing (Signed)
Discharge instructions discussed with pt and Mom. Both voice understanding.

## 2018-06-23 NOTE — Transfer of Care (Signed)
Immediate Anesthesia Transfer of Care Note  Patient: Victoria Greene  Procedure(s) Performed: LAPAROSCOPIC BILATERAL SALPINGO OOPHORECTOMY (Bilateral Abdomen)  Patient Location: PACU  Anesthesia Type:General  Level of Consciousness: drowsy  Airway & Oxygen Therapy: Patient Spontanous Breathing and Patient connected to face mask oxygen  Post-op Assessment: Report given to RN and Post -op Vital signs reviewed and stable  Post vital signs: Reviewed and stable  Last Vitals:  Vitals Value Taken Time  BP 106/65 06/23/2018 11:45 AM  Temp    Pulse 71 06/23/2018 11:47 AM  Resp 14 06/23/2018 11:47 AM  SpO2 97 % 06/23/2018 11:47 AM  Vitals shown include unvalidated device data.  Last Pain:  Vitals:   06/23/18 0907  TempSrc: Oral         Complications: No apparent anesthesia complications

## 2018-06-23 NOTE — OR Nursing (Signed)
Dr. Enzo Bi in to see pt [id number]

## 2018-06-23 NOTE — Anesthesia Postprocedure Evaluation (Signed)
Anesthesia Post Note  Patient: Victoria Greene  Procedure(s) Performed: LAPAROSCOPIC BILATERAL SALPINGO OOPHORECTOMY (Bilateral Abdomen)  Patient location during evaluation: PACU Anesthesia Type: General Level of consciousness: awake and alert Pain management: pain level controlled Vital Signs Assessment: post-procedure vital signs reviewed and stable Respiratory status: spontaneous breathing and respiratory function stable Cardiovascular status: stable Anesthetic complications: no     Last Vitals:  Vitals:   06/23/18 0907 06/23/18 1145  BP: 113/80   Pulse: 66   Resp: 16   Temp: 36.7 C (P) 36.9 C  SpO2: 100%     Last Pain:  Vitals:   06/23/18 0907  TempSrc: Oral                 Gearl Baratta K

## 2018-06-23 NOTE — Discharge Instructions (Signed)

## 2018-06-23 NOTE — Op Note (Signed)
OPERATIVE NOTE:  Victoria Greene PROCEDURE DATE: 06/23/2018   PREOPERATIVE DIAGNOSIS: 1.  Complex left ovarian cyst 2.  Abdominal/pelvic pain 3.  History of breast cancer  POSTOPERATIVE DIAGNOSIS: 1.  Pelvic adhesive disease 2.  Left paratubal cysts 3.  Abdominal pelvic pain 4.  History of breast cancer  PROCEDURE: Laparoscopic BSO with adhesio lysis SURGEON:  Brayton Mars, MD ASSISTANTS: Jeannie Fend, MD ANESTHESIA: General INDICATIONS: 52 y.o. G2P2001 menopausal, with chronic abdominal pelvic pain, with history of breast cancer, with findings notable for complex left adnexal cyst, presents for surgical management  FINDINGS: Periumbilical omental adhesions; these adhesions were lysed.  Normal-appearing right ovary and tube.  Complex left adnexal mass with adhesions and paratubal cysts. Bilateral ureters appeared to have normal peristalsis postoperatively.  I/O's: No intake/output data recorded. COUNTS:  YES SPECIMENS: Bilateral tubes and ovaries ANTIBIOTIC PROPHYLAXIS:N/A COMPLICATIONS: None immediate  PROCEDURE IN DETAIL: Due to the complex nature of the procedure and lack of available qualified first assistant, Dr. Jeannie Fend was tasked to assist in the surgery.  Patient brought the operating room placed in supine position.  General endotracheal anesthesia was induced that difficulty.  A ChloraPrep and Hibiclens abdominal perineal and intravaginal prep and drape was performed in standard fashion. Timeout was completed. Sponge stick was placed into the vagina to help orient anatomy within the pelvis.  A red Robinson catheter was used to drain 50 mL of urine from the bladder. Subumbilical vertical incision 5 mm in length was made.  The Optiview laparoscopic trocar system was used to place a 5 mm port directly into the abdominal pelvic cavity without evidence of bowel or vascular injury.  A 5 mm port was placed in left lower quadrant.  An 11 mm port was placed in the right  lower quadrant under direct visualization.  Periumbilical omental adhesions were taken down with the Ace harmonic scalpel.  The bilateral salpingo-oophorectomy was then performed in standard fashion.  The left adnexal structures were adhesed to the pelvic sidewall and some adhesio lysis had to be performed there in order to mobilize the tube and ovary away from the pelvic sidewall.  The Ace harmonic scalpel was used to grasp coagulate and cut the infundibular pelvic ligament.  The mesosalpinx likewise was clamped coagulated and cut in standard fashion.  A similar procedure was carried out on the right adnexa which was not adhesed. An Endo Catch bag was then used through the 11 mm port to remove the bilateral tubes and ovaries.  Once this was accomplished, the Carter-Thomason suture ligature instrument was then used to place a 0 Vicryl suture in the fascia of the 11 mm port site.  Good closure was confirmed with nice resolution of the fascial defect.  Further inspection of the pelvis was notable for a normal-appearing appendix.  The upper abdomen was normal.  Hemostasis was excellent.  Ureters were noted to be peristalsing bilaterally and normal anatomic orientation well away from the operative sites.  The pneumoperitoneum was released.  The incisions were closed with 4-0 Monocryl suture in a simple interrupted manner.  The Dermabond glue was placed over the incisions.  Patient was then awakened mobilized and taken care of in satisfactory condition.  Estimated blood loss was less than 5 mL.  Urine output was 50 mL.  Procedure was well-tolerated.  No complications were encountered.  Autym Siess A. Zipporah Plants, MD, ACOG [facility]

## 2018-06-23 NOTE — Anesthesia Preprocedure Evaluation (Signed)
Anesthesia Evaluation  Patient identified by MRN, date of birth, ID band Patient awake    Reviewed: Allergy & Precautions, NPO status , Patient's Chart, lab work & pertinent test results  History of Anesthesia Complications Negative for: history of anesthetic complications  Airway Mallampati: II       Dental   Pulmonary neg sleep apnea, neg COPD,           Cardiovascular (-) hypertension(-) Past MI and (-) CHF (-) dysrhythmias      Neuro/Psych neg Seizures    GI/Hepatic Neg liver ROS, GERD (diet controlled)  ,  Endo/Other  neg diabetes  Renal/GU negative Renal ROS     Musculoskeletal   Abdominal   Peds  Hematology   Anesthesia Other Findings   Reproductive/Obstetrics                             Anesthesia Physical Anesthesia Plan  ASA: II  Anesthesia Plan: General   Post-op Pain Management:    Induction:   PONV Risk Score and Plan: 3 and Ondansetron, Dexamethasone and Midazolam  Airway Management Planned: Oral ETT  Additional Equipment:   Intra-op Plan:   Post-operative Plan:   Informed Consent: I have reviewed the patients History and Physical, chart, labs and discussed the procedure including the risks, benefits and alternatives for the proposed anesthesia with the patient or authorized representative who has indicated his/her understanding and acceptance.     Plan Discussed with:   Anesthesia Plan Comments:         Anesthesia Quick Evaluation

## 2018-06-24 ENCOUNTER — Telehealth: Payer: Self-pay | Admitting: Obstetrics and Gynecology

## 2018-06-24 NOTE — Telephone Encounter (Signed)
Pt aware we can not locate her FMLA paperwork. Asked pt to resend.

## 2018-06-24 NOTE — Telephone Encounter (Signed)
The patient called and stated that she has a few concerns in regards to her needing her FMLA paperwork faxed. The patient stated that it was due last Friday. Please advise.

## 2018-06-25 LAB — HIV-1/2 AB - DIFFERENTIATION
HIV 1 Ab: NEGATIVE
HIV 2 Ab: NEGATIVE
Note: NEGATIVE

## 2018-06-25 LAB — RNA QUALITATIVE

## 2018-06-26 LAB — SURGICAL PATHOLOGY

## 2018-06-27 NOTE — Telephone Encounter (Signed)
FMLA faxed with confirmation. Pt aware.

## 2018-06-29 ENCOUNTER — Other Ambulatory Visit: Payer: Self-pay

## 2018-06-29 ENCOUNTER — Emergency Department
Admission: EM | Admit: 2018-06-29 | Discharge: 2018-06-29 | Disposition: A | Discharge status: Home / Self Care | Payer: Managed Care, Other (non HMO) | Attending: Student in an Organized Health Care Education/Training Program | Admitting: Student in an Organized Health Care Education/Training Program

## 2018-06-29 DIAGNOSIS — Z79899 Other long term (current) drug therapy: Secondary | ICD-10-CM | POA: Diagnosis not present

## 2018-06-29 DIAGNOSIS — R220 Localized swelling, mass and lump, head: Secondary | ICD-10-CM

## 2018-06-29 DIAGNOSIS — R600 Localized edema: Secondary | ICD-10-CM | POA: Diagnosis present

## 2018-06-29 DIAGNOSIS — K130 Diseases of lips: Secondary | ICD-10-CM | POA: Diagnosis not present

## 2018-06-29 MED ORDER — LIDOCAINE HCL (PF) 1 % IJ SOLN
5.0000 mL | Freq: Once | INTRAMUSCULAR | Status: AC
  Administered 2018-06-29: 5 mL via INTRADERMAL

## 2018-06-29 MED ORDER — PREDNISONE 20 MG PO TABS
60.0000 mg | ORAL_TABLET | Freq: Once | ORAL | Status: AC
  Administered 2018-06-29: 60 mg via ORAL
  Filled 2018-06-29: qty 3

## 2018-06-29 MED ORDER — DIPHENHYDRAMINE HCL 25 MG PO CAPS
50.0000 mg | ORAL_CAPSULE | Freq: Once | ORAL | Status: AC
  Administered 2018-06-29: 50 mg via ORAL
  Filled 2018-06-29: qty 2

## 2018-06-29 MED ORDER — CLINDAMYCIN HCL 300 MG PO CAPS: 300.0000 mg | ORAL_CAPSULE | Freq: Three times a day (TID) | ORAL | 0 refills | Status: AC

## 2018-06-29 MED ORDER — FLUCONAZOLE 150 MG PO TABS: 150.0000 mg | ORAL_TABLET | Freq: Once | ORAL | 0 refills | Status: AC

## 2018-06-29 MED ORDER — CLINDAMYCIN HCL 150 MG PO CAPS
300.0000 mg | ORAL_CAPSULE | Freq: Once | ORAL | Status: AC
  Administered 2018-06-29: 300 mg via ORAL
  Filled 2018-06-29: qty 2

## 2018-06-29 MED ORDER — FAMOTIDINE 20 MG PO TABS
20.0000 mg | ORAL_TABLET | Freq: Once | ORAL | Status: AC
  Administered 2018-06-29: 20 mg via ORAL
  Filled 2018-06-29: qty 1

## 2018-06-29 NOTE — ED Provider Notes (Signed)
[facility] Emergency Department Provider Note    First MD Initiated Contact with Patient 06/29/18 [phone]     (approximate)  I have reviewed the triage vital signs and the nursing notes.   HISTORY  Chief Complaint Facial Swelling    HPI Victoria Greene is a 52 y.o. female presents with left upper lip swelling for the past three days.  Patient thought it was a pimple and tried to squeeze it.  Developed worsening swelling overnight and pain.  Not been on any antibiotics.  Is not on lisinopril or ACE inhibitor.  Does have family history of angioedema.  Past Medical History:  Diagnosis Date  . Breast cancer ([place]) 2010   Right breast cancer - radiation  . GERD (gastroesophageal reflux disease)    H/O  . Glaucoma   . Personal history of radiation therapy   . Urinary frequency   . Vaginal discharge    Family History  Problem Relation Age of Onset  . Breast cancer Maternal Aunt 38  . Breast cancer Maternal Aunt 46  . Heart disease Father   . Thyroid disease Mother   . Pancreatic cancer Mother   . Ovarian cancer Neg Hx   . Colon cancer Neg Hx   . Diabetes Neg Hx    Past Surgical History:  Procedure Laterality Date  . BREAST BIOPSY Left 2010   stereotactic  . BREAST EXCISIONAL BIOPSY Right 2010   +  . BREAST LUMPECTOMY Right 2010  . BREAST LUMPECTOMY    . CESAREAN SECTION    . COLONOSCOPY WITH PROPOFOL N/A 05/26/2018   Procedure: COLONOSCOPY WITH PROPOFOL;  Surgeon: Lin Landsman, MD;  Location: [facility] ENDOSCOPY;  Service: Gastroenterology;  Laterality: N/A;  . ESOPHAGOGASTRODUODENOSCOPY (EGD) WITH PROPOFOL N/A 05/26/2018   Procedure: ESOPHAGOGASTRODUODENOSCOPY (EGD) WITH PROPOFOL;  Surgeon: Lin Landsman, MD;  Location: [facility] ENDOSCOPY;  Service: Gastroenterology;  Laterality: N/A;  . EYE SURGERY  20013   GLAUCOMA TUBE PLACED  . LAPAROSCOPIC BILATERAL SALPINGO OOPHERECTOMY Bilateral 06/23/2018   Procedure: LAPAROSCOPIC BILATERAL SALPINGO  OOPHORECTOMY;  Surgeon: Brayton Mars, MD;  Location: [facility] ORS;  Service: Gynecology;  Laterality: Bilateral;  . VAGINAL HYSTERECTOMY  2009   lsh   Patient Active Problem List   Diagnosis Date Noted  . Ovarian cyst, complex, left 06/23/2018  . Status post laparoscopy with BSO and adhesio lysis 06/23/2018  . Family history of pancreatic cancer-mom 04/24/2018  . S/P hysterectomy 04/17/2016  . History of breast cancer 04/17/2016  . Other fatigue 04/17/2016  . Overweight (BMI 25.0-29.9) 04/17/2016  . Vitreous degeneration 11/17/2010  . 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 Borderline glaucoma with steroid responders 11/17/2010      Prior to Admission medications   Medication Sig Start Date End Date Taking? Authorizing Provider  acetaminophen (TYLENOL) 500 MG tablet Take 1,000 mg by mouth every 6 (six) hours as needed for moderate pain or headache.    [provider]  clindamycin (CLEOCIN) 300 MG capsule Take 1 capsule (300 mg total) by mouth 3 (three) times daily for 5 days. 06/29/18 07/04/18  Merlyn Lot, MD  FIBER ADULT GUMMIES PO Take 3 each by mouth daily.    [provider]  fluconazole (DIFLUCAN) 150 MG tablet Take 1 tablet (150 mg total) by mouth once for 1 dose. 06/29/18 06/29/18  Merlyn Lot, MD  fluticasone (FLONASE) 50 MCG/ACT nasal spray Place 2 sprays into both nostrils at bedtime as  needed for allergies.  03/20/17   [provider]  HYDROmorphone (DILAUDID) 2 MG tablet Take 0.5 tablets (1 mg total) by mouth every 4 (four) hours as needed for moderate pain or severe pain. 06/23/18   Defrancesco, Alanda Slim, MD  ibuprofen (ADVIL,MOTRIN) 600 MG tablet Take 1 tablet (600 mg total) by mouth 4 (four) times daily. 06/23/18   Defrancesco, Alanda Slim, MD  loratadine (CLARITIN) 10 MG tablet Take 10 mg by mouth daily.    [provider]  Multiple Vitamin (MULTIVITAMIN)  capsule Take 1 capsule by mouth daily.    [provider]    Allergies Nickel; Shellfish allergy; and Shellfish-derived products    Social History Social History   Tobacco Use  . Smoking status: Never Smoker  . Smokeless tobacco: Never Used  Substance Use Topics  . Alcohol use: Yes    Comment: occas  . Drug use: No    Review of Systems Patient denies headaches, rhinorrhea, blurry vision, numbness, shortness of breath, chest pain, edema, cough, abdominal pain, nausea, vomiting, diarrhea, dysuria, fevers, rashes or hallucinations unless otherwise stated above in HPI. ____________________________________________   PHYSICAL EXAM:  VITAL SIGNS: Vitals:   06/29/18 0333  BP: 125/85  Pulse: (!) 108  Resp: 18  Temp: 98.7 F (37.1 C)  SpO2: 99%    Constitutional: Alert and oriented. Well appearing and in no acute distress. Eyes: Conjunctivae are normal.  Head: Atraumatic. Nose: No congestion/rhinnorhea. Mouth/Throat: Mucous membranes are moist.  Area of angioedema to left upper lip but area of fluctuance and tenderness with a small area of crusted purulence concerning for closed draining abscess no lingular edema no trismus.  No uvular edema Neck: Painless ROM.  Cardiovascular:   Good peripheral circulation. Respiratory: Normal respiratory effort.  No retractions.  Gastrointestinal: Soft and nontender.  Musculoskeletal: No lower extremity tenderness .  No joint effusions. Neurologic:  Normal speech and language. No gross focal neurologic deficits are appreciated.  Skin:  Skin is warm, dry and intact. No rash noted. Psychiatric: Mood and affect are normal. Speech and behavior are normal.  ____________________________________________   LABS (all labs ordered are listed, but only abnormal results are displayed)  No results found for this or any previous visit (from the past 24  hour(s)). ____________________________________________  EKG__________________________________  RADIOLOGY  EMERGENCY DEPARTMENT US SOFT TISSUE INTERPRETATION "Study: Limited Soft Tissue Ultrasound"  INDICATIONS: Soft tissue infection Multiple views of the body part were obtained in real-time with a multi-frequency linear probe  PERFORMED BY: Myself IMAGES ARCHIVED?: No SIDE:Left BODY PART:left lip INTERPRETATION:  Abcess present    ____________________________________________   PROCEDURES  Procedure(s) performed:  Marland KitchenMarland KitchenIncision and Drainage Date/Time: 06/29/2018 7:19 AM Performed by: Merlyn Lot, MD Authorized by: Merlyn Lot, MD   Consent:    Consent obtained:  Verbal   Consent given by:  Patient   Risks discussed:  Bleeding, infection, incomplete drainage and pain   Alternatives discussed:  Alternative treatment, delayed treatment and observation Location:    Type:  Abscess   Location:  Head   Head location:  Face Pre-procedure details:    Skin preparation:  Betadine Anesthesia (see MAR for exact dosages):    Anesthesia method:  Local infiltration   Local anesthetic:  Lidocaine 1% w/o epi and bupivacaine 0.5% w/o epi Procedure type:    Complexity:  Complex Procedure details:    Needle aspiration: yes     Needle size:  18 G   Incision types:  Single straight   Wound management:  Probed and deloculated   Drainage:  Purulent   Drainage amount:  Moderate   Packing materials:  None Post-procedure details:    Patient tolerance of procedure:  Tolerated well, no immediate complications      Critical Care performed: no ____________________________________________   INITIAL IMPRESSION / ASSESSMENT AND PLAN / ED COURSE  Pertinent labs & imaging results that were available during my care of the patient were reviewed by me and considered in my medical decision making (see chart for details).  DDX: abscess. Cellulitis,. angioedema  Victoria Greene is  a 52 y.o. who presents to the ED with symptoms as described above.  Afebrile Heema dynamically stable.  Most consistent with abscess.  I&D performed with improvement in symptoms.  Patient will be started on antibiotics for area surrounding cellulitis.  Patient stable and appropriate for outpatient follow-up      ____________________________________________   FINAL CLINICAL IMPRESSION(S) / ED DIAGNOSES  Final diagnoses:  Facial swelling  Abscess of lip      NEW MEDICATIONS STARTED DURING THIS VISIT:  New Prescriptions   CLINDAMYCIN (CLEOCIN) 300 MG CAPSULE    Take 1 capsule (300 mg total) by mouth 3 (three) times daily for 5 days.   FLUCONAZOLE (DIFLUCAN) 150 MG TABLET    Take 1 tablet (150 mg total) by mouth once for 1 dose.     Note:  This document was prepared using Dragon voice recognition software and may include unintentional dictation errors.     Merlyn Lot, MD 06/29/18 0730

## 2018-06-29 NOTE — ED Notes (Signed)
ED Provider at bedside. 

## 2018-06-29 NOTE — ED Triage Notes (Addendum)
Patient reports noticed a bump on her left upper lip and "picked at it with a pin".  Shortly after that noted small area was swollen.  Tonight entire upper lip is swollen.  No swelling noted to tongue, no respiratory distress noted.  Patient speaking in complete sentences without difficulty or distress.

## 2018-07-07 ENCOUNTER — Ambulatory Visit
Admission: RE | Admit: 2018-07-07 | Discharge: 2018-07-07 | Disposition: A | Discharge status: Home / Self Care | Payer: Managed Care, Other (non HMO) | Source: Ambulatory Visit | Attending: Obstetrics and Gynecology | Admitting: Obstetrics and Gynecology

## 2018-07-07 DIAGNOSIS — Z853 Personal history of malignant neoplasm of breast: Secondary | ICD-10-CM | POA: Insufficient documentation

## 2018-07-08 ENCOUNTER — Encounter: Payer: Self-pay | Admitting: Obstetrics and Gynecology

## 2018-07-08 ENCOUNTER — Ambulatory Visit (INDEPENDENT_AMBULATORY_CARE_PROVIDER_SITE_OTHER): Payer: Managed Care, Other (non HMO) | Admitting: Obstetrics and Gynecology

## 2018-07-08 VITALS — BP 118/68 | HR 71 | Ht 61.0 in | Wt 147.4 lb

## 2018-07-08 DIAGNOSIS — N83299 Other ovarian cyst, unspecified side: Secondary | ICD-10-CM

## 2018-07-08 DIAGNOSIS — Z90710 Acquired absence of both cervix and uterus: Secondary | ICD-10-CM

## 2018-07-08 DIAGNOSIS — Z09 Encounter for follow-up examination after completed treatment for conditions other than malignant neoplasm: Secondary | ICD-10-CM

## 2018-07-08 DIAGNOSIS — Z853 Personal history of malignant neoplasm of breast: Secondary | ICD-10-CM

## 2018-07-08 DIAGNOSIS — Z9889 Other specified postprocedural states: Secondary | ICD-10-CM

## 2018-07-08 DIAGNOSIS — N80109 Endometriosis of ovary, unspecified side, unspecified depth: Secondary | ICD-10-CM

## 2018-07-08 DIAGNOSIS — N801 Endometriosis of ovary: Secondary | ICD-10-CM

## 2018-07-08 NOTE — Progress Notes (Signed)
Chief complaint: 1.  Postop check 2.  Status post laparoscopic BSO with adhesio lysis  Patient has done tremendously well postoperatively.  She experienced no significant pelvic pain.  No incisional pain.  Has not used any analgesics. Bowel and bladder function are normal.  Patient reports no significant abdominal pelvic pain.  Previous symptoms are resolved.  Bowel function is normalized.  Bladder function is normalized.  Pathology: DIAGNOSIS:  A. FALLOPIAN TUBES AND OVARIES, BILATERAL; SALPINGO-OOPHORECTOMY:  - FOLLICULAR CYST, LARGER OVARY (CLINICALLY LEFT).  - ENDOMETRIOSIS, SMALLER OVARY.  - CORTICAL INCLUSION CYSTS, BILATERAL.  - PARATUBAL CYSTS, BILATERAL.  - ADHESIONS, BILATERAL.   Physical exam: BP 118/68   Pulse 71   Ht 5\' 1"  (1.549 m)   Wt 147 lb 6.4 oz (66.9 kg)   BMI 27.85 kg/m  Pleasant female no acute distress.  Alert and oriented. Abdomen: Soft, nontender without organomegaly; laparoscopy incisions are well approximated; residual suture is noted in the right lower quadrant port site.  No hernia.  No inflammation.  No discharge. Extremities: Warm and dry  ASSESSMENT: 1.  Normal postop check 2 weeks status post laparoscopic adhesio lysis with BSO 2.  Endometriosis on pathology of right ovary 3.  History of breast cancer  PLAN: 1.  Resume all activities without restriction 2.  Return in 1 year for annual exam-Dr. Marcelline Mates 3.  Return as needed for any gynecologic issues  Brayton Mars, MD  Note: This dictation was prepared with Dragon dictation along with smaller phrase technology. Any transcriptional errors that result from this process are unintentional.

## 2018-07-08 NOTE — Patient Instructions (Addendum)
1.  Resume activities as tolerated 2.  Keep annual gynecologic follow-up in August 2020-Dr. Cherry

## 2018-08-14 ENCOUNTER — Ambulatory Visit: Admit: 2018-08-14 | Discharge: 2018-08-15 | Payer: PRIVATE HEALTH INSURANCE

## 2018-08-14 DIAGNOSIS — H3552 Pigmentary retinal dystrophy: Secondary | ICD-10-CM

## 2018-08-14 DIAGNOSIS — Z9841 Cataract extraction status, right eye: Secondary | ICD-10-CM

## 2018-08-14 DIAGNOSIS — Z961 Presence of intraocular lens: Secondary | ICD-10-CM

## 2018-08-14 DIAGNOSIS — H35359 Cystoid macular degeneration, unspecified eye: Principal | ICD-10-CM

## 2018-08-14 DIAGNOSIS — H40003 Preglaucoma, unspecified, bilateral: Secondary | ICD-10-CM

## 2018-08-14 DIAGNOSIS — H2512 Age-related nuclear cataract, left eye: Secondary | ICD-10-CM

## 2018-10-30 ENCOUNTER — Ambulatory Visit: Admit: 2018-10-30 | Discharge: 2018-10-31 | Payer: PRIVATE HEALTH INSURANCE

## 2018-10-30 DIAGNOSIS — H2512 Age-related nuclear cataract, left eye: Secondary | ICD-10-CM

## 2018-10-30 DIAGNOSIS — H3091 Unspecified chorioretinal inflammation, right eye: Principal | ICD-10-CM

## 2018-10-30 DIAGNOSIS — H35351 Cystoid macular degeneration, right eye: Secondary | ICD-10-CM

## 2018-10-30 DIAGNOSIS — Z9841 Cataract extraction status, right eye: Secondary | ICD-10-CM

## 2018-10-30 DIAGNOSIS — Z961 Presence of intraocular lens: Secondary | ICD-10-CM

## 2018-10-30 DIAGNOSIS — H409 Unspecified glaucoma: Secondary | ICD-10-CM

## 2019-01-15 ENCOUNTER — Ambulatory Visit: Admit: 2019-01-15 | Discharge: 2019-01-16 | Payer: PRIVATE HEALTH INSURANCE

## 2019-01-15 DIAGNOSIS — H2512 Age-related nuclear cataract, left eye: Secondary | ICD-10-CM

## 2019-01-15 DIAGNOSIS — H35351 Cystoid macular degeneration, right eye: Secondary | ICD-10-CM

## 2019-01-15 DIAGNOSIS — Z961 Presence of intraocular lens: Secondary | ICD-10-CM

## 2019-01-15 DIAGNOSIS — H3091 Unspecified chorioretinal inflammation, right eye: Principal | ICD-10-CM

## 2019-01-15 DIAGNOSIS — Z9841 Cataract extraction status, right eye: Secondary | ICD-10-CM

## 2019-03-10 ENCOUNTER — Ambulatory Visit (INDEPENDENT_AMBULATORY_CARE_PROVIDER_SITE_OTHER): Payer: 59

## 2019-03-10 ENCOUNTER — Ambulatory Visit (INDEPENDENT_AMBULATORY_CARE_PROVIDER_SITE_OTHER): Payer: 59 | Admitting: Podiatry

## 2019-03-10 ENCOUNTER — Other Ambulatory Visit: Payer: Self-pay

## 2019-03-10 ENCOUNTER — Encounter: Payer: Self-pay | Admitting: Podiatry

## 2019-03-10 VITALS — Temp 97.1°F

## 2019-03-10 DIAGNOSIS — M7662 Achilles tendinitis, left leg: Secondary | ICD-10-CM

## 2019-03-12 NOTE — Progress Notes (Signed)
   HPI: 53 y.o. female presenting today with a chief complaint of intermittent posterior heel pain that began about nine months ago. She reports associated stiffness and a nodule that appeared about three months ago. She has not done anything for treatment and denies modifying factors. Patient is here for further evaluation and treatment.   Past Medical History:  Diagnosis Date  . Breast cancer ([place]) 2010   Right breast cancer - radiation  . GERD (gastroesophageal reflux disease)    H/O  . Glaucoma   . Personal history of radiation therapy   . Urinary frequency   . Vaginal discharge      Physical Exam: General: The patient is alert and oriented x3 in no acute distress.  Dermatology: Skin is warm, dry and supple bilateral lower extremities. Negative for open lesions or macerations.  Vascular: Palpable pedal pulses bilaterally. No edema or erythema noted. Capillary refill within normal limits.  Neurological: Epicritic and protective threshold grossly intact bilaterally.   Musculoskeletal Exam: Palpable nodule noted 2-3 cm proximal to insertion of the calcaneus. Range of motion within normal limits to all pedal and ankle joints bilateral. Muscle strength 5/5 in all groups bilateral.   Radiographic Exam:  Enlargement of the Achilles noted 2-3 cm proximal to the insertion of the calcaneus.    Assessment: 1. Achilles tendinosis left - asymptomatic    Plan of Care:  1. Patient evaluated. X-Rays reviewed.  2. Recommended daily stretching.  3. Recommended good shoe gear.  4. May resume full activity with no restrictions.  5. Return to clinic as needed.     Edrick Kins, DPM [facility]  Dr. Edrick Kins, DPM    [address], [place] [address]                Office [phone]  Fax [phone]

## 2019-04-21 ENCOUNTER — Ambulatory Visit: Admit: 2019-04-21 | Discharge: 2019-04-22 | Payer: PRIVATE HEALTH INSURANCE

## 2019-04-21 DIAGNOSIS — H40003 Preglaucoma, unspecified, bilateral: Secondary | ICD-10-CM

## 2019-04-21 DIAGNOSIS — H3552 Pigmentary retinal dystrophy: Secondary | ICD-10-CM

## 2019-04-21 DIAGNOSIS — H35351 Cystoid macular degeneration, right eye: Secondary | ICD-10-CM

## 2019-04-21 DIAGNOSIS — H3091 Unspecified chorioretinal inflammation, right eye: Principal | ICD-10-CM

## 2019-04-21 DIAGNOSIS — Z9841 Cataract extraction status, right eye: Secondary | ICD-10-CM

## 2019-04-21 DIAGNOSIS — H2512 Age-related nuclear cataract, left eye: Secondary | ICD-10-CM

## 2019-04-21 DIAGNOSIS — Z961 Presence of intraocular lens: Secondary | ICD-10-CM

## 2019-04-28 ENCOUNTER — Telehealth: Payer: Self-pay

## 2019-04-28 ENCOUNTER — Ambulatory Visit: Admit: 2019-04-28 | Discharge: 2019-04-29 | Payer: PRIVATE HEALTH INSURANCE

## 2019-04-28 DIAGNOSIS — H35351 Cystoid macular degeneration, right eye: Principal | ICD-10-CM

## 2019-04-28 DIAGNOSIS — H3091 Unspecified chorioretinal inflammation, right eye: Secondary | ICD-10-CM

## 2019-04-28 NOTE — Telephone Encounter (Signed)
Coronavirus (COVID-19) Are you at risk?  Are you at risk for the Coronavirus (COVID-19)?  To be considered HIGH RISK for Coronavirus (COVID-19), you have to meet the following criteria:  . Traveled to [place], [place], [place], [place] or [place]; or in the [place] to [place], [place], [place], or [place]; and have fever, cough, and shortness of breath within the last 2 weeks of travel OR . Been in close contact with a person diagnosed with COVID-19 within the last 2 weeks and have fever, cough, and shortness of breath . IF YOU DO NOT MEET THESE CRITERIA, YOU ARE CONSIDERED LOW RISK FOR COVID-19.  What to do if you are HIGH RISK for COVID-19?  Marland Kitchen If you are having a medical emergency, call 911. . Seek medical care right away. Before you go to a doctor's office, urgent care or emergency department, call ahead and tell them about your recent travel, contact with someone diagnosed with COVID-19, and your symptoms. You should receive instructions from your physician's office regarding next steps of care.  . When you arrive at healthcare provider, tell the healthcare staff immediately you have returned from visiting [place], [place], [place], [place] or [place]; or traveled in the [place] to [place], [place], [place], or [place]; in the last two weeks or you have been in close contact with a person diagnosed with COVID-19 in the last 2 weeks.   . Tell the health care staff about your symptoms: fever, cough and shortness of breath. . After you have been seen by a medical provider, you will be either: o Tested for (COVID-19) and discharged home on quarantine except to seek medical care if symptoms worsen, and asked to  - Stay home and avoid contact with others until you get your results (4-5 days)  - Avoid travel on public transportation if possible (such as bus, train, or airplane) or o Sent to the Emergency Department by EMS for evaluation, COVID-19 testing, and possible  admission depending on your condition and test results.  What to do if you are LOW RISK for COVID-19?  Reduce your risk of any infection by using the same precautions used for avoiding the common cold or flu:  Marland Kitchen Wash your hands often with soap and warm water for at least 20 seconds.  If soap and water are not readily available, use an alcohol-based hand sanitizer with at least 60% alcohol.  . If coughing or sneezing, cover your mouth and nose by coughing or sneezing into the elbow areas of your shirt or coat, into a tissue or into your sleeve (not your hands). . Avoid shaking hands with others and consider head nods or verbal greetings only. . Avoid touching your eyes, nose, or mouth with unwashed hands.  . Avoid close contact with people who are [place]. . Avoid places or events with large numbers of people in one location, like concerts or sporting events. . Carefully consider travel plans you have or are making. . If you are planning any travel outside or inside the [place], visit the CDC's Travelers' Health webpage for the latest health notices. . If you have some symptoms but not all symptoms, continue to monitor at home and seek medical attention if your symptoms worsen. . If you are having a medical emergency, call 911.  04/28/19 SCREENING NEG SLS ADDITIONAL HEALTHCARE OPTIONS FOR PATIENTS  [facility] / e-Visit: [email or web address]         [facility]: [phone]  [facility]: [phone]                   [facility]: [phone]  

## 2019-04-29 ENCOUNTER — Encounter: Payer: Self-pay | Admitting: Obstetrics and Gynecology

## 2019-04-29 ENCOUNTER — Ambulatory Visit (INDEPENDENT_AMBULATORY_CARE_PROVIDER_SITE_OTHER): Payer: 59 | Admitting: Obstetrics and Gynecology

## 2019-04-29 ENCOUNTER — Encounter: Payer: Managed Care, Other (non HMO) | Admitting: Obstetrics and Gynecology

## 2019-04-29 ENCOUNTER — Other Ambulatory Visit: Payer: Self-pay

## 2019-04-29 VITALS — BP 133/85 | HR 66 | Ht 61.0 in | Wt 146.7 lb

## 2019-04-29 DIAGNOSIS — Z01419 Encounter for gynecological examination (general) (routine) without abnormal findings: Secondary | ICD-10-CM | POA: Diagnosis not present

## 2019-04-29 DIAGNOSIS — Z8742 Personal history of other diseases of the female genital tract: Secondary | ICD-10-CM

## 2019-04-29 DIAGNOSIS — E663 Overweight: Secondary | ICD-10-CM

## 2019-04-29 DIAGNOSIS — Z131 Encounter for screening for diabetes mellitus: Secondary | ICD-10-CM

## 2019-04-29 DIAGNOSIS — Z87440 Personal history of urinary (tract) infections: Secondary | ICD-10-CM

## 2019-04-29 DIAGNOSIS — Z13220 Encounter for screening for lipoid disorders: Secondary | ICD-10-CM

## 2019-04-29 DIAGNOSIS — N342 Other urethritis: Secondary | ICD-10-CM | POA: Insufficient documentation

## 2019-04-29 DIAGNOSIS — Z853 Personal history of malignant neoplasm of breast: Secondary | ICD-10-CM

## 2019-04-29 NOTE — Progress Notes (Signed)
Pt is present for annual exam. Pt stated that she is doing well no problems.  

## 2019-04-29 NOTE — Patient Instructions (Addendum)
Preventive Care 40-51 Years Old, Female Preventive care refers to visits with your health care provider and lifestyle choices that can promote health and wellness. This includes:  A yearly physical exam. This may also be called an annual well check.  Regular dental visits and eye exams.  Immunizations.  Screening for certain conditions.  Healthy lifestyle choices, such as eating a healthy diet, getting regular exercise, not using drugs or products that contain nicotine and tobacco, and limiting alcohol use. What can I expect for my preventive care visit? Physical exam Your health care provider will check your:  Height and weight. This may be used to calculate body mass index (BMI), which tells if you are at a healthy weight.  Heart rate and blood pressure.  Skin for abnormal spots. Counseling Your health care provider may ask you questions about your:  Alcohol, tobacco, and drug use.  Emotional well-being.  Home and relationship well-being.  Sexual activity.  Eating habits.  Work and work environment.  Method of birth control.  Menstrual cycle.  Pregnancy history. What immunizations do I need?  Influenza (flu) vaccine  This is recommended every year. Tetanus, diphtheria, and pertussis (Tdap) vaccine  You may need a Td booster every 10 years. Varicella (chickenpox) vaccine  You may need this if you have not been vaccinated. Zoster (shingles) vaccine  You may need this after age 60. Measles, mumps, and rubella (MMR) vaccine  You may need at least one dose of MMR if you were born in 1957 or later. You may also need a second dose. Pneumococcal conjugate (PCV13) vaccine  You may need this if you have certain conditions and were not previously vaccinated. Pneumococcal polysaccharide (PPSV23) vaccine  You may need one or two doses if you smoke cigarettes or if you have certain conditions. Meningococcal conjugate (MenACWY) vaccine  You may need this if you  have certain conditions. Hepatitis A vaccine  You may need this if you have certain conditions or if you travel or work in places where you may be exposed to hepatitis A. Hepatitis B vaccine  You may need this if you have certain conditions or if you travel or work in places where you may be exposed to hepatitis B. Haemophilus influenzae type b (Hib) vaccine  You may need this if you have certain conditions. Human papillomavirus (HPV) vaccine  If recommended by your health care provider, you may need three doses over 6 months. You may receive vaccines as individual doses or as more than one vaccine together in one shot (combination vaccines). Talk with your health care provider about the risks and benefits of combination vaccines. What tests do I need? Blood tests  Lipid and cholesterol levels. These may be checked every 5 years, or more frequently if you are over 50 years old.  Hepatitis C test.  Hepatitis B test. Screening  Lung cancer screening. You may have this screening every year starting at age 55 if you have a 30-pack-year history of smoking and currently smoke or have quit within the past 15 years.  Colorectal cancer screening. All adults should have this screening starting at age 50 and continuing until age 75. Your health care provider may recommend screening at age 45 if you are at increased risk. You will have tests every 1-10 years, depending on your results and the type of screening test.  Diabetes screening. This is done by checking your blood sugar (glucose) after you have not eaten for a while (fasting). You may have this   done every 1-3 years.  Mammogram. This may be done every 1-2 years. Talk with your health care provider about when you should start having regular mammograms. This may depend on whether you have a family history of breast cancer.  BRCA-related cancer screening. This may be done if you have a family history of breast, ovarian, tubal, or peritoneal  cancers.  Pelvic exam and Pap test. This may be done every 3 years starting at age 23. Starting at age 86, this may be done every 5 years if you have a Pap test in combination with an HPV test. Other tests  Sexually transmitted disease (STD) testing.  Bone density scan. This is done to screen for osteoporosis. You may have this scan if you are at high risk for osteoporosis. Follow these instructions at home: Eating and drinking  Eat a diet that includes fresh fruits and vegetables, whole grains, lean protein, and low-fat dairy.  Take vitamin and mineral supplements as recommended by your health care provider.  Do not drink alcohol if: ? Your health care provider tells you not to drink. ? You are pregnant, may be pregnant, or are planning to become pregnant.  If you drink alcohol: ? Limit how much you have to 0-1 drink a day. ? Be aware of how much alcohol is in your drink. In the [place] one drink equals one 12 oz bottle of beer (355 mL), one 5 oz glass of wine (148 mL), or one 1 oz glass of hard liquor (44 mL). Lifestyle  Take daily care of your teeth and gums.  Stay active. Exercise for at least 30 minutes on 5 or more days each week.  Do not use any products that contain nicotine or tobacco, such as cigarettes, e-cigarettes, and chewing tobacco. If you need help quitting, ask your health care provider.  If you are sexually active, practice safe sex. Use a condom or other form of birth control (contraception) in order to prevent pregnancy and STIs (sexually transmitted infections).  If told by your health care provider, take low-dose aspirin daily starting at age 27. What's next?  Visit your health care provider once a year for a well check visit.  Ask your health care provider how often you should have your eyes and teeth checked.  Stay up to date on all vaccines. This information is not intended to replace advice given to you by your health care provider. Make sure you  discuss any questions you have with your health care provider. Document Released: 09/16/2015 Document Revised: 05/01/2018 Document Reviewed: 05/01/2018 Elsevier Patient Education  2020 [place] self-awareness is knowing how your breasts look and feel. Doing breast self-awareness is important. It allows you to catch a breast problem early while it is still small and can be treated. All women should do breast self-awareness, including women who have had breast implants. Tell your doctor if you notice a change in your breasts. What you need:  A mirror.  A well-lit room. How to do a breast self-exam A breast self-exam is one way to learn what is normal for your breasts and to check for changes. To do a breast self-exam: Look for changes  1. Take off all the clothes above your waist. 2. Stand in front of a mirror in a room with good lighting. 3. Put your hands on your hips. 4. Push your hands down. 5. Look at your breasts and nipples in the mirror to see if one breast or nipple looks  different from the other. Check to see if: ? The shape of one breast is different. ? The size of one breast is different. ? There are wrinkles, dips, and bumps in one breast and not the other. 6. Look at each breast for changes in the skin, such as: ? Redness. ? Scaly areas. 7. Look for changes in your nipples, such as: ? Liquid around the nipples. ? Bleeding. ? Dimpling. ? Redness. ? A change in where the nipples are. Feel for changes  1. Lie on your back on the floor. 2. Feel each breast. To do this, follow these steps: ? Pick a breast to feel. ? Put the arm closest to that breast above your head. ? Use your other arm to feel the nipple area of your breast. Feel the area with the pads of your three middle fingers by making small circles with your fingers. For the first circle, press lightly. For the second circle, press harder. For the third circle, press even harder.  ? Keep making circles with your fingers at the different pressures as you move down your breast. Stop when you feel your ribs. ? Move your fingers a little toward the center of your body. ? Start making circles with your fingers again, this time going up until you reach your collarbone. ? Keep making up-and-down circles until you reach your armpit. Remember to keep using the three pressures. ? Feel the other breast in the same way. 3. Sit or stand in the tub or shower. 4. With soapy water on your skin, feel each breast the same way you did in step 2 when you were lying on the floor. Write down what you find Writing down what you find can help you remember what to tell your doctor. Write down:  What is normal for each breast.  Any changes you find in each breast, including: ? The kind of changes you find. ? Whether you have pain. ? Size and location of any lumps.  When you last had your menstrual period. General tips  Check your breasts every month.  If you are breastfeeding, the best time to check your breasts is after you feed your baby or after you use a breast pump.  If you get menstrual periods, the best time to check your breasts is 5-7 days after your menstrual period is over.  With time, you will become comfortable with the self-exam, and you will begin to know if there are changes in your breasts. Contact a doctor if you:  See a change in the shape or size of your breasts or nipples.  See a change in the skin of your breast or nipples, such as red or scaly skin.  Have fluid coming from your nipples that is not normal.  Find a lump or thick area that was not there before.  Have pain in your breasts.  Have any concerns about your breast health. Summary  Breast self-awareness includes looking for changes in your breasts, as well as feeling for changes within your breasts.  Breast self-awareness should be done in front of a mirror in a well-lit room.  You should  check your breasts every month. If you get menstrual periods, the best time to check your breasts is 5-7 days after your menstrual period is over.  Let your doctor know of any changes you see in your breasts, including changes in size, changes on the skin, pain or tenderness, or fluid from your nipples that is not normal. This  information is not intended to replace advice given to you by your health care provider. Make sure you discuss any questions you have with your health care provider. Document Released: 02/06/2008 Document Revised: 04/08/2018 Document Reviewed: 04/08/2018 Elsevier Patient Education  2020 [employer]  

## 2019-04-29 NOTE — Progress Notes (Signed)
ANNUAL PREVENTATIVE CARE GYNECOLOGY  ENCOUNTER NOTE  Subjective:       Victoria Greene is a 53 y.o. G39P2001 female here for a routine annual gynecologic exam. She is transitioning care from Dr. Hassell Done Defrancesco who has retired. She has a past history significant for hysterectomy and subsequent BSO with diagnosis of endometriosis. Also with a history of breast cancer ~ 11 years ago, status post lumpectomy and radiation therapy.  She also has had negative BRCA gene testing.  The patient is sexually active (same partner x 3 years). The patient has never been taking hormone replacement therapy. Patient denies post-menopausal vaginal bleeding. The patient wears seatbelts: yes. The patient participates in regular exercise: yes (walking 2 days a week, was more active prior to COVID-19). Has the patient ever been transfused or tattooed?: no. The patient reports that there is not domestic violence in her life.  Current complaints: 1.  Has been having recurrent UTI's.  Has been dealing with this for ~ 10 years, has been on Vesicare.  Was seen by Urologist, who noted that she had menopausal urethral atrophy, and advised on bladder irritants. Was prescribed estrogen cream to help with symptoms but has not used it regularly yet as she was concerned about use with her history of breast cancer.  2. Patient notes that she was having pretty significant hot flushes and night sweats, however now that she has cut out caffeine, red wine and initiation of the estrogen cream, her symptoms have subsided.     Gynecologic History No LMP recorded. Patient has had a hysterectomy. Contraception: status post hysterectomy, and possibly menopausal.  Last Pap: 04/2017. Results were: normal Last mammogram: 07/17/2018. Results were: normal Last Colonoscopy: 05/26/2018. Results were: colon polyps identified. Recommend repeat in 5 years.     Obstetric History OB History  Gravida Para Term Preterm AB Living  _0 SAB TAB  Ectopic Multiple Live Births          1    # Outcome Date GA Lbr Len/2nd Weight Sex Delivery Anes PTL Lv  2 Term 1998   9 lb (4.082 kg) M CS-LTranv   LIV  1 Term 1991   7 lb 2.6 oz (3.248 kg) M CS-LTranv       Past Medical History:  Diagnosis Date  . Breast cancer ([place]) 2010   Right breast cancer - radiation  . Frequent urination   . GERD (gastroesophageal reflux disease)    H/O  . Glaucoma   . Personal history of radiation therapy   . Urinary frequency   . Vaginal discharge     Family History  Problem Relation Age of Onset  . Breast cancer Maternal Aunt 38  . Breast cancer Maternal Aunt 36  . Heart disease Father   . Thyroid disease Mother   . Pancreatic cancer Mother   . Ovarian cancer Neg Hx   . Colon cancer Neg Hx   . Diabetes Neg Hx     Past Surgical History:  Procedure Laterality Date  . BREAST BIOPSY Left 2010   stereotactic  . BREAST EXCISIONAL BIOPSY Right 2010   +  . BREAST LUMPECTOMY Right 2010  . BREAST LUMPECTOMY    . CESAREAN SECTION    . COLONOSCOPY WITH PROPOFOL N/A 05/26/2018   Procedure: COLONOSCOPY WITH PROPOFOL;  Surgeon: Lin Landsman, MD;  Location: [facility] ENDOSCOPY;  Service: Gastroenterology;  Laterality: N/A;  . ESOPHAGOGASTRODUODENOSCOPY (EGD) WITH PROPOFOL N/A 05/26/2018  Procedure: ESOPHAGOGASTRODUODENOSCOPY (EGD) WITH PROPOFOL;  Surgeon: Lin Landsman, MD;  Location: [facility] ENDOSCOPY;  Service: Gastroenterology;  Laterality: N/A;  . EYE SURGERY  20013   GLAUCOMA TUBE PLACED  . LAPAROSCOPIC BILATERAL SALPINGO OOPHERECTOMY Bilateral 06/23/2018   Procedure: LAPAROSCOPIC BILATERAL SALPINGO OOPHORECTOMY;  Surgeon: Brayton Mars, MD;  Location: [facility] ORS;  Service: Gynecology;  Laterality: Bilateral;  . VAGINAL HYSTERECTOMY  2009   lsh    Social History   Socioeconomic History  . Marital status: Divorced    Spouse name: Not on file  . Number of children: Not on file  . Years of education: Not on file  . Highest  education level: Not on file  Occupational History  . Not on file  Social Needs  . Financial resource strain: Not on file  . Food insecurity    Worry: Not on file    Inability: Not on file  . Transportation needs    Medical: Not on file    Non-medical: Not on file  Tobacco Use  . Smoking status: Never Smoker  . Smokeless tobacco: Never Used  Substance and Sexual Activity  . Alcohol use: Not Currently    Comment: occas  . Drug use: No  . Sexual activity: Yes    Birth control/protection: None  Lifestyle  . Physical activity    Days per week: 3 days    Minutes per session: 60 min  . Stress: Not on file  Relationships  . Social Herbalist on phone: Not on file    Gets together: Not on file    Attends religious service: Not on file    Active member of club or organization: Not on file    Attends meetings of clubs or organizations: Not on file    Relationship status: Not on file  . Intimate partner violence    Fear of current or ex partner: Not on file    Emotionally abused: Not on file    Physically abused: Not on file    Forced sexual activity: Not on file  Other Topics Concern  . Not on file  Social History Narrative  . Not on file    Current Outpatient Medications on File Prior to Visit  Medication Sig Dispense Refill  . acetaminophen (TYLENOL) 500 MG tablet Take 1,000 mg by mouth every 6 (six) hours as needed for moderate pain or headache.    Marland Kitchen FIBER ADULT GUMMIES PO Take 3 each by mouth daily.    . fluticasone (FLONASE) 50 MCG/ACT nasal spray Place 2 sprays into both nostrils at bedtime as needed for allergies.     Marland Kitchen ibuprofen (ADVIL,MOTRIN) 600 MG tablet Take 1 tablet (600 mg total) by mouth 4 (four) times daily. 15 tablet 0  . loratadine (CLARITIN) 10 MG tablet Take 10 mg by mouth daily.    . Multiple Vitamin (MULTIVITAMIN) capsule Take 1 capsule by mouth daily.     No current facility-administered medications on file prior to visit.     Allergies   Allergen Reactions  . Nickel Rash  . Shellfish Allergy Rash  . Shellfish-Derived Products Rash      Review of Systems ROS Review of Systems - General ROS: negative for - chills, fatigue, fever, hot flashes, night sweats, weight gain or weight loss Psychological ROS: negative for - anxiety, decreased libido, depression, mood swings, physical abuse or sexual abuse Ophthalmic ROS: negative for - blurry vision, eye pain or loss of vision ENT ROS: negative for -  headaches, hearing change, visual changes or vocal changes Allergy and Immunology ROS: negative for - hives, itchy/watery eyes or seasonal allergies Hematological and Lymphatic ROS: negative for - bleeding problems, bruising, swollen lymph nodes or weight loss Endocrine ROS: negative for - galactorrhea, hair pattern changes, hot flashes, malaise/lethargy, mood swings, palpitations, polydipsia/polyuria, skin changes, temperature intolerance or unexpected weight changes Breast ROS: negative for - new or changing breast lumps or nipple discharge Respiratory ROS: negative for - cough or shortness of breath Cardiovascular ROS: negative for - chest pain, irregular heartbeat, palpitations or shortness of breath Gastrointestinal ROS: no abdominal pain, change in bowel habits, or black or bloody stools Genito-Urinary ROS: no dysuria, trouble voiding, or hematuria Musculoskeletal ROS: negative for - joint pain or joint stiffness Neurological ROS: negative for - bowel and bladder control changes Dermatological ROS: negative for rash and skin lesion changes   Objective:   BP 133/85   Pulse 66   Ht _0  (1.549 m)   Wt 146 lb 11.2 oz (66.5 kg)   BMI 27.72 kg/m  CONSTITUTIONAL: Well-developed, well-nourished female in no acute distress. Overweight PSYCHIATRIC: Normal mood and affect. Normal behavior. Normal judgment and thought content. [place]: Alert and oriented to person, place, and time. Normal muscle tone coordination. No cranial  nerve deficit noted. HENT:  Normocephalic, atraumatic, External right and left ear normal. Oropharynx is clear and moist EYES: Conjunctivae and EOM are normal. Pupils are equal, round, and reactive to light. No scleral icterus.  NECK: Normal range of motion, supple, no masses.  Normal thyroid.  SKIN: Skin is warm and dry. No rash noted. Not diaphoretic. No erythema. No pallor. CARDIOVASCULAR: Normal heart rate noted, regular rhythm, no murmur. RESPIRATORY: Clear to auscultation bilaterally. Effort and breath sounds normal, no problems with respiration noted. BREASTS: Symmetric in size. No masses, skin changes, nipple drainage, or lymphadenopathy. Retracted lumpectomy scar of right breast.  ABDOMEN: Soft, normal bowel sounds, no distention noted.  No tenderness, rebound or guarding.  BLADDER: Normal PELVIC:  Bladder no bladder distension noted  Urethra: normal appearing urethra with no masses, tenderness or lesions and moderate pallor present.   Vulva: normal appearing vulva with no masses, tenderness or lesions  Vagina: normal appearing vagina with mild atrophy, no discharge, no lesions  Cervix: surgically absent  Uterus: uterus is normal size, shape, consistency and nontender  Adnexa: surgically absent bilateral  RV: External Exam NormaI, No Rectal Masses and Normal Sphincter tone  MUSCULOSKELETAL: Normal range of motion. No tenderness.  No cyanosis, clubbing, or edema.  2+ distal pulses. LYMPHATIC: No Axillary, Supraclavicular, or Inguinal Adenopathy.   Labs: Lab Results  Component Value Date   WBC 9.8 06/20/2018   HGB 13.7 06/20/2018   HCT 42.9 06/20/2018   MCV 89.0 06/20/2018   PLT 182 06/20/2018    No results found for: CREATININE, BUN, NA, K, CL, CO2  No results found for: ALT, AST, GGT, ALKPHOS, BILITOT  No results found for: CHOL, HDL, LDLCALC, LDLDIRECT, TRIG, CHOLHDL  Lab Results  Component Value Date   TSH 1.440 04/17/2016    No results found for: HGBA1C    Assessment:   1. Well woman exam with routine gynecological exam   2. History of breast cancer in female   3. Screening for diabetes mellitus   4. Screening, lipid   5. Atrophic urethritis   6. Overweight (BMI 25.0-29.9)   7. History of endometriosis   8. History of recurrent UTIs     Plan:  Pap: Not needed.  Patient has had a hysterectomy.  Mammogram: Ordered Stool Guaiac Testing:  Not Indicated.  Patient has had a colonoscopy. Due for repeat in 2024. Labs: Lipid 1, CBC, CMP, Vitamin D level, TSH and Hemoglobin A1C Routine preventative health maintenance measures emphasized: Exercise/Diet/Weight control, Tobacco Warnings, Alcohol/Substance use risks, Stress Management and Safe Sex History of recurrent UTI's and atrophic urethritis. Counseled that use of the Premarin cream to the urethra was safe, and that she had little risk of recurrence of breast cancer due to use.  History of endometriosis, stable since removal of ovaries last year. No symptoms of residual disease. H/o breast cancer s/p lumpectomy and radiation. NED today. Continue routine screening yearly. Mammogram ordered. BRCA gene testing negative.  Return to [place], MD [facility]

## 2019-04-30 LAB — COMPREHENSIVE METABOLIC PANEL
ALT: 17 IU/L (ref 0–32)
AST: 18 IU/L (ref 0–40)
Albumin/Globulin Ratio: 1.6 (ref 1.2–2.2)
Albumin: 4.4 g/dL (ref 3.8–4.8)
Alkaline Phosphatase: 82 IU/L (ref 39–117)
BUN/Creatinine Ratio: 10 (ref 9–23)
BUN: 9 mg/dL (ref 6–24)
Bilirubin Total: 1.1 mg/dL (ref 0.0–1.2)
CO2: 27 mmol/L (ref 20–29)
Calcium: 9.9 mg/dL (ref 8.7–10.2)
Chloride: 102 mmol/L (ref 96–106)
Creatinine, Ser: 0.9 mg/dL (ref 0.57–1.00)
GFR calc Af Amer: 86 mL/min/{1.73_m2} (ref >59)
GFR calc non Af Amer: 75 mL/min/{1.73_m2} (ref >59)
Globulin, Total: 2.8 g/dL (ref 1.5–4.5)
Glucose: 85 mg/dL (ref 65–99)
Potassium: 3.9 mmol/L (ref 3.5–5.2)
Sodium: 144 mmol/L (ref 134–144)
Total Protein: 7.2 g/dL (ref 6.0–8.5)

## 2019-04-30 LAB — LIPID PANEL
Chol/HDL Ratio: 3.2 ratio (ref 0.0–4.4)
Cholesterol, Total: 189 mg/dL (ref 100–199)
HDL: 59 mg/dL (ref >39)
LDL Calculated: 110 mg/dL — ABNORMAL HIGH (ref 0–99)
Triglycerides: 98 mg/dL (ref 0–149)
VLDL Cholesterol Cal: 20 mg/dL (ref 5–40)

## 2019-04-30 LAB — CBC
Hematocrit: 40.8 % (ref 34.0–46.6)
Hemoglobin: 13.7 g/dL (ref 11.1–15.9)
MCH: 28.5 pg (ref 26.6–33.0)
MCHC: 33.6 g/dL (ref 31.5–35.7)
MCV: 85 fL (ref 79–97)
Platelets: 159 10*3/uL (ref 150–450)
RBC: 4.81 x10E6/uL (ref 3.77–5.28)
RDW: 13.9 % (ref 11.7–15.4)
WBC: 5.3 10*3/uL (ref 3.4–10.8)

## 2019-04-30 LAB — HEMOGLOBIN A1C
Est. average glucose Bld gHb Est-mCnc: 105 mg/dL
Hgb A1c MFr Bld: 5.3 % (ref 4.8–5.6)

## 2019-04-30 LAB — TSH: TSH: 1.05 u[IU]/mL (ref 0.450–4.500)

## 2019-05-01 ENCOUNTER — Ambulatory Visit: Payer: 59 | Admitting: Podiatry

## 2019-07-07 ENCOUNTER — Ambulatory Visit: Admit: 2019-07-07 | Discharge: 2019-07-08 | Payer: PRIVATE HEALTH INSURANCE

## 2019-07-10 ENCOUNTER — Ambulatory Visit
Admission: RE | Admit: 2019-07-10 | Discharge: 2019-07-10 | Disposition: A | Discharge status: Home / Self Care | Payer: 59 | Source: Ambulatory Visit | Attending: Obstetrics and Gynecology | Admitting: Obstetrics and Gynecology

## 2019-07-10 DIAGNOSIS — Z853 Personal history of malignant neoplasm of breast: Secondary | ICD-10-CM | POA: Diagnosis not present

## 2019-07-10 DIAGNOSIS — Z1231 Encounter for screening mammogram for malignant neoplasm of breast: Secondary | ICD-10-CM | POA: Insufficient documentation

## 2019-07-15 ENCOUNTER — Telehealth: Payer: Self-pay

## 2019-07-15 NOTE — Telephone Encounter (Signed)
07/10/19 mammogram results. Pt hx breast ca. Pt req return call.

## 2019-07-15 NOTE — Telephone Encounter (Signed)
Pt is informed that her mammogram was normal.

## 2019-09-09 ENCOUNTER — Ambulatory Visit: Admit: 2019-09-09 | Discharge: 2019-09-10 | Payer: PRIVATE HEALTH INSURANCE

## 2019-09-09 DIAGNOSIS — Z9841 Cataract extraction status, right eye: Secondary | ICD-10-CM

## 2019-09-09 DIAGNOSIS — Z961 Presence of intraocular lens: Secondary | ICD-10-CM

## 2019-09-09 DIAGNOSIS — H3091 Unspecified chorioretinal inflammation, right eye: Principal | ICD-10-CM

## 2019-09-09 DIAGNOSIS — H2512 Age-related nuclear cataract, left eye: Principal | ICD-10-CM

## 2019-09-09 DIAGNOSIS — H40003 Preglaucoma, unspecified, bilateral: Principal | ICD-10-CM

## 2019-09-09 DIAGNOSIS — H35351 Cystoid macular degeneration, right eye: Principal | ICD-10-CM

## 2019-10-29 ENCOUNTER — Ambulatory Visit: Payer: Managed Care, Other (non HMO)

## 2019-11-11 ENCOUNTER — Ambulatory Visit: Admit: 2019-11-11 | Discharge: 2019-11-12 | Payer: PRIVATE HEALTH INSURANCE

## 2019-11-11 DIAGNOSIS — H2512 Age-related nuclear cataract, left eye: Principal | ICD-10-CM

## 2019-11-11 DIAGNOSIS — H409 Unspecified glaucoma: Principal | ICD-10-CM

## 2019-11-11 DIAGNOSIS — Z961 Presence of intraocular lens: Secondary | ICD-10-CM

## 2019-11-11 DIAGNOSIS — H35351 Cystoid macular degeneration, right eye: Principal | ICD-10-CM

## 2019-11-11 DIAGNOSIS — Z9841 Cataract extraction status, right eye: Principal | ICD-10-CM

## 2019-11-27 ENCOUNTER — Ambulatory Visit: Payer: Managed Care, Other (non HMO) | Attending: Internal Medicine

## 2019-11-27 DIAGNOSIS — Z23 Encounter for immunization: Secondary | ICD-10-CM

## 2019-11-27 NOTE — Progress Notes (Signed)
   Covid-19 Vaccination Clinic  Name:  KEISA YANDYKE    MRN: [id number] DOB: May 20, 1966  11/27/2019  Ms. Eckstein was observed post Covid-19 immunization for 15 minutes without incident. She was provided with Vaccine Information Sheet and instruction to access the V-Safe system.   Ms. Fertitta was instructed to call 911 with any severe reactions post vaccine: Marland Kitchen Difficulty breathing  . Swelling of face and throat  . A fast heartbeat  . A bad rash all over body  . Dizziness and weakness   Immunizations Administered    Name Date Dose VIS Date Route   Pfizer COVID-19 Vaccine 11/27/2019  1:23 PM 0.3 mL 08/14/2019 Intramuscular   Manufacturer: [place]   Lot: [id number]   [place]: [id number]

## 2019-12-22 ENCOUNTER — Ambulatory Visit: Payer: Managed Care, Other (non HMO) | Attending: Internal Medicine

## 2019-12-22 DIAGNOSIS — Z23 Encounter for immunization: Secondary | ICD-10-CM

## 2019-12-22 NOTE — Progress Notes (Signed)
   Covid-19 Vaccination Clinic  Name:  Victoria Greene    MRN: [id number] DOB: Mar 13, 1966  12/22/2019  Victoria Greene was observed post Covid-19 immunization for 15 minutes without incident. She was provided with Vaccine Information Sheet and instruction to access the V-Safe system.   Victoria Greene was instructed to call 911 with any severe reactions post vaccine: Marland Kitchen Difficulty breathing  . Swelling of face and throat  . A fast heartbeat  . A bad rash all over body  . Dizziness and weakness   Immunizations Administered    Name Date Dose VIS Date Route   Pfizer COVID-19 Vaccine 12/22/2019  4:22 PM 0.3 mL 10/28/2018 Intramuscular   Manufacturer: [place]   Lot: [id number]   [place]: [id number]

## 2020-01-13 ENCOUNTER — Ambulatory Visit: Admit: 2020-01-13 | Discharge: 2020-01-14 | Payer: PRIVATE HEALTH INSURANCE

## 2020-01-13 DIAGNOSIS — Z961 Presence of intraocular lens: Principal | ICD-10-CM

## 2020-01-13 DIAGNOSIS — Z9841 Cataract extraction status, right eye: Principal | ICD-10-CM

## 2020-01-13 DIAGNOSIS — H3091 Unspecified chorioretinal inflammation, right eye: Principal | ICD-10-CM

## 2020-01-13 DIAGNOSIS — H35351 Cystoid macular degeneration, right eye: Principal | ICD-10-CM

## 2020-01-13 DIAGNOSIS — H2512 Age-related nuclear cataract, left eye: Principal | ICD-10-CM

## 2020-01-13 DIAGNOSIS — H409 Unspecified glaucoma: Principal | ICD-10-CM

## 2020-01-13 DIAGNOSIS — H3552 Pigmentary retinal dystrophy: Principal | ICD-10-CM

## 2020-03-22 ENCOUNTER — Ambulatory Visit: Admit: 2020-03-22 | Discharge: 2020-03-23 | Payer: PRIVATE HEALTH INSURANCE

## 2020-03-22 DIAGNOSIS — H40003 Preglaucoma, unspecified, bilateral: Principal | ICD-10-CM

## 2020-03-22 DIAGNOSIS — H3091 Unspecified chorioretinal inflammation, right eye: Principal | ICD-10-CM

## 2020-03-22 DIAGNOSIS — H35351 Cystoid macular degeneration, right eye: Principal | ICD-10-CM

## 2020-03-22 DIAGNOSIS — H3581 Retinal edema: Principal | ICD-10-CM

## 2020-05-12 NOTE — Patient Instructions (Addendum)
Preventive Care 40-52 Years Old, Female Preventive care refers to visits with your health care provider and lifestyle choices that can promote health and wellness. This includes:  A yearly physical exam. This may also be called an annual well check.  Regular dental visits and eye exams.  Immunizations.  Screening for certain conditions.  Healthy lifestyle choices, such as eating a healthy diet, getting regular exercise, not using drugs or products that contain nicotine and tobacco, and limiting alcohol use. What can I expect for my preventive care visit? Physical exam Your health care provider will check your:  Height and weight. This may be used to calculate body mass index (BMI), which tells if you are at a healthy weight.  Heart rate and blood pressure.  Skin for abnormal spots. Counseling Your health care provider may ask you questions about your:  Alcohol, tobacco, and drug use.  Emotional well-being.  Home and relationship well-being.  Sexual activity.  Eating habits.  Work and work environment.  Method of birth control.  Menstrual cycle.  Pregnancy history. What immunizations do I need?  Influenza (flu) vaccine  This is recommended every year. Tetanus, diphtheria, and pertussis (Tdap) vaccine  You may need a Td booster every 10 years. Varicella (chickenpox) vaccine  You may need this if you have not been vaccinated. Zoster (shingles) vaccine  You may need this after age 60. Measles, mumps, and rubella (MMR) vaccine  You may need at least one dose of MMR if you were born in 1957 or later. You may also need a second dose. Pneumococcal conjugate (PCV13) vaccine  You may need this if you have certain conditions and were not previously vaccinated. Pneumococcal polysaccharide (PPSV23) vaccine  You may need one or two doses if you smoke cigarettes or if you have certain conditions. Meningococcal conjugate (MenACWY) vaccine  You may need this if you  have certain conditions. Hepatitis A vaccine  You may need this if you have certain conditions or if you travel or work in places where you may be exposed to hepatitis A. Hepatitis B vaccine  You may need this if you have certain conditions or if you travel or work in places where you may be exposed to hepatitis B. Haemophilus influenzae type b (Hib) vaccine  You may need this if you have certain conditions. Human papillomavirus (HPV) vaccine  If recommended by your health care provider, you may need three doses over 6 months. You may receive vaccines as individual doses or as more than one vaccine together in one shot (combination vaccines). Talk with your health care provider about the risks and benefits of combination vaccines. What tests do I need? Blood tests  Lipid and cholesterol levels. These may be checked every 5 years, or more frequently if you are over 50 years old.  Hepatitis C test.  Hepatitis B test. Screening  Lung cancer screening. You may have this screening every year starting at age 55 if you have a 30-pack-year history of smoking and currently smoke or have quit within the past 15 years.  Colorectal cancer screening. All adults should have this screening starting at age 50 and continuing until age 75. Your health care provider may recommend screening at age 45 if you are at increased risk. You will have tests every 1-10 years, depending on your results and the type of screening test.  Diabetes screening. This is done by checking your blood sugar (glucose) after you have not eaten for a while (fasting). You may have this   done every 1-3 years.  Mammogram. This may be done every 1-2 years. Talk with your health care provider about when you should start having regular mammograms. This may depend on whether you have a family history of breast cancer.  BRCA-related cancer screening. This may be done if you have a family history of breast, ovarian, tubal, or peritoneal  cancers.  Pelvic exam and Pap test. This may be done every 3 years starting at age 60. Starting at age 7, this may be done every 5 years if you have a Pap test in combination with an HPV test. Other tests  Sexually transmitted disease (STD) testing.  Bone density scan. This is done to screen for osteoporosis. You may have this scan if you are at high risk for osteoporosis. Follow these instructions at home: Eating and drinking  Eat a diet that includes fresh fruits and vegetables, whole grains, lean protein, and low-fat dairy.  Take vitamin and mineral supplements as recommended by your health care provider.  Do not drink alcohol if: ? Your health care provider tells you not to drink. ? You are pregnant, may be pregnant, or are planning to become pregnant.  If you drink alcohol: ? Limit how much you have to 0-1 drink a day. ? Be aware of how much alcohol is in your drink. In the [place] one drink equals one 12 oz bottle of beer (355 mL), one 5 oz glass of wine (148 mL), or one 1 oz glass of hard liquor (44 mL). Lifestyle  Take daily care of your teeth and gums.  Stay active. Exercise for at least 30 minutes on 5 or more days each week.  Do not use any products that contain nicotine or tobacco, such as cigarettes, e-cigarettes, and chewing tobacco. If you need help quitting, ask your health care provider.  If you are sexually active, practice safe sex. Use a condom or other form of birth control (contraception) in order to prevent pregnancy and STIs (sexually transmitted infections).  If told by your health care provider, take low-dose aspirin daily starting at age 48. What's next?  Visit your health care provider once a year for a well check visit.  Ask your health care provider how often you should have your eyes and teeth checked.  Stay up to date on all vaccines. This information is not intended to replace advice given to you by your health care provider. Make sure you  discuss any questions you have with your health care provider. Document Revised: 05/01/2018 Document Reviewed: 05/01/2018 Elsevier Patient Education  2020 [place] Breast self-awareness is knowing how your breasts look and feel. Doing breast self-awareness is important. It allows you to catch a breast problem early while it is still small and can be treated. All women should do breast self-awareness, including women who have had breast implants. Tell your doctor if you notice a change in your breasts. What you need:  A mirror.  A well-lit room. How to do a breast self-exam A breast self-exam is one way to learn what is normal for your breasts and to check for changes. To do a breast self-exam: Look for changes  1. Take off all the clothes above your waist. 2. Stand in front of a mirror in a room with good lighting. 3. Put your hands on your hips. 4. Push your hands down. 5. Look at your breasts and nipples in the mirror to see if one breast or nipple looks different from the  other. Check to see if: ? The shape of one breast is different. ? The size of one breast is different. ? There are wrinkles, dips, and bumps in one breast and not the other. 6. Look at each breast for changes in the skin, such as: ? Redness. ? Scaly areas. 7. Look for changes in your nipples, such as: ? Liquid around the nipples. ? Bleeding. ? Dimpling. ? Redness. ? A change in where the nipples are. Feel for changes  1. Lie on your back on the floor. 2. Feel each breast. To do this, follow these steps: ? Pick a breast to feel. ? Put the arm closest to that breast above your head. ? Use your other arm to feel the nipple area of your breast. Feel the area with the pads of your three middle fingers by making small circles with your fingers. For the first circle, press lightly. For the second circle, press harder. For the third circle, press even harder. ? Keep making circles with  your fingers at the different pressures as you move down your breast. Stop when you feel your ribs. ? Move your fingers a little toward the center of your body. ? Start making circles with your fingers again, this time going up until you reach your collarbone. ? Keep making up-and-down circles until you reach your armpit. Remember to keep using the three pressures. ? Feel the other breast in the same way. 3. Sit or stand in the tub or shower. 4. With soapy water on your skin, feel each breast the same way you did in step 2 when you were lying on the floor. Write down what you find Writing down what you find can help you remember what to tell your doctor. Write down:  What is normal for each breast.  Any changes you find in each breast, including: ? The kind of changes you find. ? Whether you have pain. ? Size and location of any lumps.  When you last had your menstrual period. General tips  Check your breasts every month.  If you are breastfeeding, the best time to check your breasts is after you feed your baby or after you use a breast pump.  If you get menstrual periods, the best time to check your breasts is 5-7 days after your menstrual period is over.  With time, you will become comfortable with the self-exam, and you will begin to know if there are changes in your breasts. Contact a doctor if you:  See a change in the shape or size of your breasts or nipples.  See a change in the skin of your breast or nipples, such as red or scaly skin.  Have fluid coming from your nipples that is not normal.  Find a lump or thick area that was not there before.  Have pain in your breasts.  Have any concerns about your breast health. Summary  Breast self-awareness includes looking for changes in your breasts, as well as feeling for changes within your breasts.  Breast self-awareness should be done in front of a mirror in a well-lit room.  You should check your breasts every month.  If you get menstrual periods, the best time to check your breasts is 5-7 days after your menstrual period is over.  Let your doctor know of any changes you see in your breasts, including changes in size, changes on the skin, pain or tenderness, or fluid from your nipples that is not normal. This information is not  intended to replace advice given to you by your health care provider. Make sure you discuss any questions you have with your health care provider. Document Revised: 04/08/2018 Document Reviewed: 04/08/2018 Elsevier Patient Education  [place].    Constipation, Adult Constipation is when a person has fewer bowel movements in a week than normal, has difficulty having a bowel movement, or has stools that are dry, hard, or larger than normal. Constipation may be caused by an underlying condition. It may become worse with age if a person takes certain medicines and does not take in enough fluids. Follow these instructions at home: Eating and drinking   Eat foods that have a lot of fiber, such as fresh fruits and vegetables, whole grains, and beans.  Limit foods that are high in fat, low in fiber, or overly processed, such as french fries, hamburgers, cookies, candies, and soda.  Drink enough fluid to keep your urine clear or pale yellow. General instructions  Exercise regularly or as told by your health care provider.  Go to the restroom when you have the urge to go. Do not hold it in.  Take over-the-counter and prescription medicines only as told by your health care provider. These include any fiber supplements.  Practice pelvic floor retraining exercises, such as deep breathing while relaxing the lower abdomen and pelvic floor relaxation during bowel movements.  Watch your condition for any changes.  Keep all follow-up visits as told by your health care provider. This is important. Contact a health care provider if:  You have pain that gets worse.  You have a  fever.  You do not have a bowel movement after 4 days.  You vomit.  You are not hungry.  You lose weight.  You are bleeding from the anus.  You have thin, pencil-like stools. Get help right away if:  You have a fever and your symptoms suddenly get worse.  You leak stool or have blood in your stool.  Your abdomen is bloated.  You have severe pain in your abdomen.  You feel dizzy or you faint. This information is not intended to replace advice given to you by your health care provider. Make sure you discuss any questions you have with your health care provider. Document Revised: 08/02/2017 Document Reviewed: 02/08/2016 Elsevier Patient Education  2020 [employer].

## 2020-05-12 NOTE — Progress Notes (Signed)
Pt present for annual exam. Pt stated that she was doing well and denies any issues at this time.  

## 2020-05-13 ENCOUNTER — Ambulatory Visit (INDEPENDENT_AMBULATORY_CARE_PROVIDER_SITE_OTHER): Payer: 59 | Admitting: Obstetrics and Gynecology

## 2020-05-13 ENCOUNTER — Encounter: Payer: Self-pay | Admitting: Obstetrics and Gynecology

## 2020-05-13 ENCOUNTER — Other Ambulatory Visit: Payer: Self-pay

## 2020-05-13 VITALS — BP 132/85 | HR 67 | Ht 61.0 in | Wt 150.9 lb

## 2020-05-13 DIAGNOSIS — Z853 Personal history of malignant neoplasm of breast: Secondary | ICD-10-CM

## 2020-05-13 DIAGNOSIS — Z87440 Personal history of urinary (tract) infections: Secondary | ICD-10-CM

## 2020-05-13 DIAGNOSIS — Z01419 Encounter for gynecological examination (general) (routine) without abnormal findings: Secondary | ICD-10-CM

## 2020-05-13 DIAGNOSIS — E663 Overweight: Secondary | ICD-10-CM

## 2020-05-13 DIAGNOSIS — K5909 Other constipation: Secondary | ICD-10-CM

## 2020-05-13 NOTE — Progress Notes (Signed)
ANNUAL PREVENTATIVE CARE GYNECOLOGY  ENCOUNTER NOTE  Subjective:       Victoria Greene is a 54 y.o. G54P2001 female here for a routine annual gynecologic exam.  She has a past history significant for breast cancer ~ 12 years ago, status post lumpectomy and radiation therapy.  She also has had negative BRCA gene testing.  The patient is sexually active. The patient has never taken hormone replacement therapy. Patient denies post-menopausal vaginal bleeding. Victoria Greene does wears seatbelts: yes. Does the patient participates in regular exercise? yes  Has the patient ever been transfused or tattooed?: no. The patient reports that there is not domestic violence in her life.  Current complaints/concerns: 1.  Reports recurrent UTI's have improved with use of estrogen cream and decreasing caffeine consumption 2. Still has some issues with flatulence.  Is helped with Gas-X. Also takes Miralax due to issues with chronic ocnstipation.  3. Notes that her mother recently passed this week from pancreatic cancer. Is noting that overall she is coping well.  States that she has been preparing as her mother has been battling cancer for ~ 2-3 years.     Gynecologic History No LMP recorded. Patient has had a hysterectomy. Contraception: status post hysterectomy, and possibly menopausal.  Last Pap: 04/2017. Results were: normal.  Last mammogram: 07/10/2019. Results were: normal Last Colonoscopy: 05/26/2018. Results were: colon polyps identified. Recommend repeat in 5 years.     Obstetric History OB History  Gravida Para Term Preterm AB Living  2 2 2     1   SAB TAB Ectopic Multiple Live Births          1    # Outcome Date GA Lbr Len/2nd Weight Sex Delivery Anes PTL Lv  2 Term 1998   9 lb (4.082 kg) M CS-LTranv   LIV  1 Term 1991   7 lb 2.6 oz (3.248 kg) M CS-LTranv       Past Medical History:  Diagnosis Date  . Breast cancer ([place]) 2010   Right breast cancer - radiation  . Frequent urination   . GERD  (gastroesophageal reflux disease)    H/O  . Glaucoma   . Personal history of radiation therapy   . Urinary frequency   . Vaginal discharge     Family History  Problem Relation Age of Onset  . Breast cancer Maternal Aunt 38  . Breast cancer Maternal Aunt 42  . Heart disease Father   . Thyroid disease Mother   . Pancreatic cancer Mother   . Ovarian cancer Neg Hx   . Colon cancer Neg Hx   . Diabetes Neg Hx     Past Surgical History:  Procedure Laterality Date  . BREAST BIOPSY Left 2010   stereotactic  . BREAST EXCISIONAL BIOPSY Right 2010   +  . BREAST LUMPECTOMY Right 2010  . BREAST LUMPECTOMY    . CESAREAN SECTION    . COLONOSCOPY WITH PROPOFOL N/A 05/26/2018   Procedure: COLONOSCOPY WITH PROPOFOL;  Surgeon: Lin Landsman, MD;  Location[facility] ENDOSCOPY;  Service: Gastroenterology;  Laterality: N/A;  . ESOPHAGOGASTRODUODENOSCOPY (EGD) WITH PROPOFOL N/A 05/26/2018   Procedure: ESOPHAGOGASTRODUODENOSCOPY (EGD) WITH PROPOFOL;  Surgeon: Lin Landsman, MD;  Location: [facility] ENDOSCOPY;  Service: Gastroenterology;  Laterality: N/A;  . EYE SURGERY  20013   GLAUCOMA TUBE PLACED  . LAPAROSCOPIC BILATERAL SALPINGO OOPHERECTOMY Bilateral 06/23/2018   Procedure: LAPAROSCOPIC BILATERAL SALPINGO OOPHORECTOMY;  Surgeon: Brayton Mars, MD;  Location: [facility] ORS;  Service: Gynecology;  Laterality: Bilateral;  . VAGINAL HYSTERECTOMY  2009   lsh    Social History   Socioeconomic History  . Marital status: Divorced    Spouse name: Not on file  . Number of children: Not on file  . Years of education: Not on file  . Highest education level: Not on file  Occupational History  . Not on file  Tobacco Use  . Smoking status: Never Smoker  . Smokeless tobacco: Never Used  Vaping Use  . Vaping Use: Never used  Substance and Sexual Activity  . Alcohol use: Not Currently    Comment: occas  . Drug use: No  . Sexual activity: Yes    Birth control/protection: None  Other Topics  Concern  . Not on file  Social History Narrative  . Not on file   Social Determinants of Health   Financial Resource Strain:   . Difficulty of Paying Living Expenses: Not on file  Food Insecurity:   . Worried About Charity fundraiser in the Last Year: Not on file  . Ran Out of Food in the Last Year: Not on file  Transportation Needs:   . Lack of Transportation (Medical): Not on file  . Lack of Transportation (Non-Medical): Not on file  Physical Activity:   . Days of Exercise per Week: Not on file  . Minutes of Exercise per Session: Not on file  Stress:   . Feeling of Stress : Not on file  Social Connections:   . Frequency of Communication with Friends and Family: Not on file  . Frequency of Social Gatherings with Friends and Family: Not on file  . Attends Religious Services: Not on file  . Active Member of Clubs or Organizations: Not on file  . Attends Archivist Meetings: Not on file  . Marital Status: Not on file  Intimate Partner Violence:   . Fear of Current or Ex-Partner: Not on file  . Emotionally Abused: Not on file  . Physically Abused: Not on file  . Sexually Abused: Not on file    Current Outpatient Medications on File Prior to Visit  Medication Sig Dispense Refill  . acetaminophen (TYLENOL) 500 MG tablet Take 1,000 mg by mouth every 6 (six) hours as needed for moderate pain or headache.    Marland Kitchen FIBER ADULT GUMMIES PO Take 3 each by mouth daily.    . fluticasone (FLONASE) 50 MCG/ACT nasal spray Place 2 sprays into both nostrils at bedtime as needed for allergies.     Marland Kitchen ibuprofen (ADVIL,MOTRIN) 600 MG tablet Take 1 tablet (600 mg total) by mouth 4 (four) times daily. 15 tablet 0  . loratadine (CLARITIN) 10 MG tablet Take 10 mg by mouth daily.    . Multiple Vitamin (MULTIVITAMIN) capsule Take 1 capsule by mouth daily.     No current facility-administered medications on file prior to visit.    Allergies  Allergen Reactions  . Nickel Rash  . Shellfish  Allergy Rash  . Shellfish-Derived Products Rash      Review of Systems ROS Review of Systems - General ROS: negative for - chills, fatigue, fever, hot flashes, night sweats, weight gain or weight loss Psychological ROS: negative for - anxiety, decreased libido, depression, mood swings, physical abuse or sexual abuse Ophthalmic ROS: negative for - blurry vision, eye pain or loss of vision ENT ROS: negative for - headaches, hearing change, visual changes or vocal changes Allergy and Immunology ROS: negative for - hives, itchy/watery eyes or seasonal allergies  Hematological and Lymphatic ROS: negative for - bleeding problems, bruising, swollen lymph nodes or weight loss Endocrine ROS: negative for - galactorrhea, hair pattern changes, hot flashes, malaise/lethargy, mood swings, palpitations, polydipsia/polyuria, skin changes, temperature intolerance or unexpected weight changes Breast ROS: negative for - new or changing breast lumps or nipple discharge Respiratory ROS: negative for - cough or shortness of breath Cardiovascular ROS: negative for - chest pain, irregular heartbeat, palpitations or shortness of breath Gastrointestinal ROS: no abdominal pain, change in bowel habits, or black or bloody stools Genito-Urinary ROS: no dysuria, trouble voiding, or hematuria Musculoskeletal ROS: negative for - joint pain or joint stiffness Neurological ROS: negative for - bowel and bladder control changes Dermatological ROS: negative for rash and skin lesion changes   Objective:   BP 132/85   Pulse 67   Ht 5' 1"  (1.549 m)   Wt 150 lb 14.4 oz (68.4 kg)   BMI 28.51 kg/m  CONSTITUTIONAL: Well-developed, well-nourished female in no acute distress. Overweight PSYCHIATRIC: Normal mood and affect. Normal behavior. Normal judgment and thought content. [place]: Alert and oriented to person, place, and time. Normal muscle tone coordination. No cranial nerve deficit noted. HENT:  Normocephalic,  atraumatic, External right and left ear normal. Oropharynx is clear and moist EYES: Conjunctivae and EOM are normal. Pupils are equal, round, and reactive to light. No scleral icterus.  NECK: Normal range of motion, supple, no masses.  Normal thyroid.  SKIN: Skin is warm and dry. No rash noted. Not diaphoretic. No erythema. No pallor. CARDIOVASCULAR: Normal heart rate noted, regular rhythm, no murmur. RESPIRATORY: Clear to auscultation bilaterally. Effort and breath sounds normal, no problems with respiration noted. BREASTS: Symmetric in size. No masses, skin changes, nipple drainage, or lymphadenopathy. Retracted lumpectomy scar of right breast.  ABDOMEN: Soft, normal bowel sounds, no distention noted.  No tenderness, rebound or guarding.  BLADDER: Normal PELVIC:  Bladder no bladder distension noted  Urethra: normal appearing urethra with no masses, tenderness or lesions and mild pallor present.   Vulva: normal appearing vulva with no masses, tenderness or lesions  Vagina: normal appearing vagina with mild atrophy, no discharge, no lesions  Cervix: surgically absent  Uterus: uterus is normal size, shape, consistency and nontender  Adnexa: surgically absent bilateral  RV: External Exam NormaI, No Rectal Masses and Normal Sphincter tone  MUSCULOSKELETAL: Normal range of motion. No tenderness.  No cyanosis, clubbing, or edema.  2+ distal pulses. LYMPHATIC: No Axillary, Supraclavicular, or Inguinal Adenopathy.   Labs: Lab Results  Component Value Date   WBC 5.3 04/29/2019   HGB 13.7 04/29/2019   HCT 40.8 04/29/2019   MCV 85 04/29/2019   PLT 159 04/29/2019    Lab Results  Component Value Date   CREATININE 0.90 04/29/2019   BUN 9 04/29/2019   NA 144 04/29/2019   K 3.9 04/29/2019   CL 102 04/29/2019   CO2 27 04/29/2019    Lab Results  Component Value Date   ALT 17 04/29/2019   AST 18 04/29/2019   ALKPHOS 82 04/29/2019   BILITOT 1.1 04/29/2019    Lab Results  Component  Value Date   CHOL 189 04/29/2019   HDL 59 04/29/2019   LDLCALC 110 (H) 04/29/2019   TRIG 98 04/29/2019   CHOLHDL 3.2 04/29/2019    Lab Results  Component Value Date   TSH 1.050 04/29/2019    Lab Results  Component Value Date   HGBA1C 5.3 04/29/2019     Assessment:   1. Well woman exam  with routine gynecological exam   2. History of breast cancer in female   3. Chronic constipation   4. Overweight (BMI 25.0-29.9)   5. History of recurrent UTIs     Plan:  - Pap: Not needed. Patient has had a hysterectomy.  - Mammogram: Ordered - Stool Guaiac Testing:  Not Indicated.  Patient has had a colonoscopy. Due for repeat in 2024. 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 - Routine preventative health maintenance measures emphasized: Exercise/Diet/Weight control, Tobacco Warnings, Alcohol/Substance use risks, Stress Management and Safe Sex - History of recurrent UTI's and atrophic urethritis. Improved with Premarin cream. Can continue use.  - H/o breast cancer s/p lumpectomy and radiation. NED today. Continue routine screening yearly. Mammogram ordered. BRCA gene testing negative.  - Has completed COVID vaccination series.  - Discussion had with patient regarding issues with chronic constipation (notes she's dealt with this all her life). Advised on consideration of medications like Linzess.  Given info, patient will look into this and will inform MD if prescription is desired.  Return to [place], MD [facility]

## 2020-05-18 ENCOUNTER — Telehealth: Payer: Self-pay | Admitting: Obstetrics and Gynecology

## 2020-05-18 NOTE — Telephone Encounter (Signed)
Patient sent a MyChart message stating that her provider had discussed the patient trying a Rx for Linzess for her IBS symptoms. Patient stated that she would like to try it. Could you please advise?

## 2020-05-24 ENCOUNTER — Ambulatory Visit: Admit: 2020-05-24 | Discharge: 2020-05-25 | Payer: PRIVATE HEALTH INSURANCE

## 2020-05-24 DIAGNOSIS — H35351 Cystoid macular degeneration, right eye: Principal | ICD-10-CM

## 2020-06-01 ENCOUNTER — Other Ambulatory Visit: Payer: Self-pay

## 2020-06-01 MED ORDER — LINACLOTIDE 72 MCG PO CAPS: 72.0000 ug | ORAL_CAPSULE | Freq: Every day | ORAL | 6 refills | Status: DC

## 2020-07-11 ENCOUNTER — Ambulatory Visit
Admission: RE | Admit: 2020-07-11 | Discharge: 2020-07-11 | Disposition: A | Discharge status: Home / Self Care | Payer: 59 | Source: Ambulatory Visit | Attending: Obstetrics and Gynecology | Admitting: Obstetrics and Gynecology

## 2020-07-11 ENCOUNTER — Other Ambulatory Visit: Payer: Self-pay

## 2020-07-11 DIAGNOSIS — R921 Mammographic calcification found on diagnostic imaging of breast: Secondary | ICD-10-CM | POA: Diagnosis not present

## 2020-07-11 DIAGNOSIS — Z1231 Encounter for screening mammogram for malignant neoplasm of breast: Secondary | ICD-10-CM | POA: Diagnosis not present

## 2020-07-11 DIAGNOSIS — Z853 Personal history of malignant neoplasm of breast: Secondary | ICD-10-CM | POA: Insufficient documentation

## 2020-07-12 ENCOUNTER — Other Ambulatory Visit: Payer: Self-pay | Admitting: Obstetrics and Gynecology

## 2020-07-12 DIAGNOSIS — R928 Other abnormal and inconclusive findings on diagnostic imaging of breast: Secondary | ICD-10-CM

## 2020-07-12 DIAGNOSIS — R921 Mammographic calcification found on diagnostic imaging of breast: Secondary | ICD-10-CM

## 2020-07-13 NOTE — Telephone Encounter (Signed)
Pt called in and stated that [facility] called and said they need a order for a Diagnostic mammogram. Pt pt already sent a mychart message. The pt is requesting the order. Please advise

## 2020-07-14 ENCOUNTER — Telehealth: Payer: Self-pay

## 2020-07-14 NOTE — Telephone Encounter (Signed)
Patient called in stating that she has been trying to get in touch with [facility] to scheduled her mammogram and that she has been trying to get in touch with someone and no one is answering or returning her phone calls. Patient states that this is worrisome as she is a breast cancer survivor. Patient is wondering if she would be sent somewhere else for her mammogram. Could you please advise?

## 2020-07-15 NOTE — Telephone Encounter (Signed)
Called Victoria Greene no answer the office had closed at 4pm. LM via Vm to contact the pt, leaving pt's name and phone number also my name, practice name and phone number. Advised Victoria Greene to please contact the pt.

## 2020-07-18 ENCOUNTER — Other Ambulatory Visit: Payer: Self-pay

## 2020-07-18 ENCOUNTER — Ambulatory Visit
Admission: RE | Admit: 2020-07-18 | Discharge: 2020-07-18 | Disposition: A | Discharge status: Home / Self Care | Payer: 59 | Source: Ambulatory Visit | Attending: Obstetrics and Gynecology | Admitting: Obstetrics and Gynecology

## 2020-07-18 DIAGNOSIS — R928 Other abnormal and inconclusive findings on diagnostic imaging of breast: Secondary | ICD-10-CM | POA: Diagnosis not present

## 2020-07-18 DIAGNOSIS — R921 Mammographic calcification found on diagnostic imaging of breast: Secondary | ICD-10-CM | POA: Diagnosis present

## 2020-07-25 ENCOUNTER — Other Ambulatory Visit: Payer: Self-pay | Admitting: Obstetrics and Gynecology

## 2020-07-25 DIAGNOSIS — R921 Mammographic calcification found on diagnostic imaging of breast: Secondary | ICD-10-CM

## 2020-07-25 DIAGNOSIS — R928 Other abnormal and inconclusive findings on diagnostic imaging of breast: Secondary | ICD-10-CM

## 2020-07-26 ENCOUNTER — Ambulatory Visit: Admit: 2020-07-26 | Discharge: 2020-07-27 | Payer: PRIVATE HEALTH INSURANCE

## 2020-07-26 DIAGNOSIS — H43819 Vitreous degeneration, unspecified eye: Principal | ICD-10-CM

## 2020-07-26 DIAGNOSIS — H2512 Age-related nuclear cataract, left eye: Principal | ICD-10-CM

## 2020-07-26 DIAGNOSIS — H409 Unspecified glaucoma: Principal | ICD-10-CM

## 2020-07-26 DIAGNOSIS — H35351 Cystoid macular degeneration, right eye: Principal | ICD-10-CM

## 2020-07-26 DIAGNOSIS — Z961 Presence of intraocular lens: Principal | ICD-10-CM

## 2020-08-09 ENCOUNTER — Ambulatory Visit
Admission: RE | Admit: 2020-08-09 | Discharge: 2020-08-09 | Disposition: A | Discharge status: Home / Self Care | Payer: 59 | Source: Ambulatory Visit | Attending: Obstetrics and Gynecology | Admitting: Obstetrics and Gynecology

## 2020-08-09 ENCOUNTER — Other Ambulatory Visit: Payer: Self-pay

## 2020-08-09 DIAGNOSIS — R928 Other abnormal and inconclusive findings on diagnostic imaging of breast: Secondary | ICD-10-CM | POA: Insufficient documentation

## 2020-08-09 DIAGNOSIS — R921 Mammographic calcification found on diagnostic imaging of breast: Secondary | ICD-10-CM | POA: Diagnosis present

## 2020-08-09 HISTORY — PX: BREAST BIOPSY: SHX20

## 2020-08-10 LAB — SURGICAL PATHOLOGY

## 2020-09-28 ENCOUNTER — Ambulatory Visit: Admit: 2020-09-28 | Discharge: 2020-09-29 | Payer: PRIVATE HEALTH INSURANCE

## 2020-09-28 DIAGNOSIS — H35351 Cystoid macular degeneration, right eye: Principal | ICD-10-CM

## 2020-09-28 DIAGNOSIS — H209 Unspecified iridocyclitis: Principal | ICD-10-CM

## 2020-11-04 ENCOUNTER — Ambulatory Visit: Admit: 2020-11-04 | Discharge: 2020-11-05 | Payer: PRIVATE HEALTH INSURANCE

## 2020-11-04 DIAGNOSIS — H35351 Cystoid macular degeneration, right eye: Principal | ICD-10-CM

## 2020-12-02 ENCOUNTER — Ambulatory Visit: Admit: 2020-12-02 | Discharge: 2020-12-03 | Payer: PRIVATE HEALTH INSURANCE

## 2021-01-20 ENCOUNTER — Ambulatory Visit: Admit: 2021-01-20 | Discharge: 2021-01-21 | Payer: PRIVATE HEALTH INSURANCE

## 2021-01-20 DIAGNOSIS — H35351 Cystoid macular degeneration, right eye: Principal | ICD-10-CM

## 2021-03-10 ENCOUNTER — Ambulatory Visit: Admit: 2021-03-10 | Discharge: 2021-03-11 | Payer: PRIVATE HEALTH INSURANCE

## 2021-03-10 DIAGNOSIS — H35351 Cystoid macular degeneration, right eye: Principal | ICD-10-CM

## 2021-03-10 MED ORDER — ESTRADIOL 0.1 MG/GM VA CREA: TOPICAL_CREAM | VAGINAL | 6 refills | Status: DC

## 2021-03-17 ENCOUNTER — Other Ambulatory Visit: Payer: Self-pay

## 2021-03-17 MED ORDER — ESTRADIOL 0.1 MG/GM VA CREA
TOPICAL_CREAM | VAGINAL | 6 refills | Status: DC
Start: 2021-03-17 — End: 2021-03-21

## 2021-03-21 ENCOUNTER — Telehealth: Payer: Self-pay

## 2021-03-21 ENCOUNTER — Telehealth: Payer: 59

## 2021-03-21 MED ORDER — ESTRADIOL 0.1 MG/GM VA CREA
TOPICAL_CREAM | VAGINAL | 6 refills | Status: DC
Start: 2021-03-21 — End: 2022-09-05

## 2021-03-21 NOTE — Telephone Encounter (Signed)
Spoke to pharmacy. Medication is ready for pick up with 5.00 copay. Will send pt a mychart message and call pt informing her of the information.

## 2021-03-21 NOTE — Telephone Encounter (Signed)
Pt called no answer LM via VM that her medication has been refilled and ready for pick up at [facility] with a copayment of $5.00.

## 2021-03-21 NOTE — Telephone Encounter (Signed)
Spoke to pharmacy concerning the patient's medication estradiol. Sent in another rx for the medication. Will check on the status after lunch.

## 2021-04-19 ENCOUNTER — Ambulatory Visit: Admit: 2021-04-19 | Discharge: 2021-04-20 | Payer: PRIVATE HEALTH INSURANCE

## 2021-04-19 DIAGNOSIS — H35351 Cystoid macular degeneration, right eye: Principal | ICD-10-CM

## 2021-05-02 ENCOUNTER — Encounter: Payer: Self-pay | Admitting: Podiatry

## 2021-05-02 ENCOUNTER — Other Ambulatory Visit: Payer: Self-pay

## 2021-05-02 ENCOUNTER — Ambulatory Visit (INDEPENDENT_AMBULATORY_CARE_PROVIDER_SITE_OTHER): Payer: 59 | Admitting: Podiatry

## 2021-05-02 DIAGNOSIS — M5416 Radiculopathy, lumbar region: Secondary | ICD-10-CM

## 2021-05-02 DIAGNOSIS — M5431 Sciatica, right side: Secondary | ICD-10-CM | POA: Diagnosis not present

## 2021-05-02 MED ORDER — METHYLPREDNISOLONE 4 MG PO TBPK: ORAL_TABLET | ORAL | 0 refills | Status: DC

## 2021-05-02 MED ORDER — GABAPENTIN 100 MG PO CAPS: 100.0000 mg | ORAL_CAPSULE | Freq: Three times a day (TID) | ORAL | 3 refills | Status: DC

## 2021-05-02 NOTE — Progress Notes (Signed)
   HPI: 55 y.o. female presenting today for new complaint regarding pain and tenderness that begins in her back and hips and extends down to her feet.  Especially the right side although she says it can alternate sides.  Patient experiences numbness with burning and tingling to the bilateral lower extremities.  She states that she has been diagnosed with sciatica in the past and her PCP prescribed a muscle relaxer several years ago.  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  Dermatology: Skin is warm, dry and supple bilateral lower extremities. Negative for open lesions or macerations.  Vascular: Palpable pedal pulses bilaterally. No edema or erythema noted. Capillary refill within normal limits.  Neurological: Epicritic and protective threshold grossly intact bilaterally.  Paresthesia with numbness and tingling and burning to the right lower extremity extending all the way proximal to the hip  Musculoskeletal Exam: No pedal deformities noted.  Muscle strength 5/5   Assessment: 1.  Lumbar radiculopathy 2.  Sciatica right lower extremity   Plan of Care:  1. Patient evaluated. 2.  Recommend that the patient follows up or is referred to a neuro spine specialist.  Explained that the patient most likely has lumbar radiculopathy or sciatica causing nerve impingement throughout the entire lower extremity.  Patient understands 3.  Prescription for a Medrol Dosepak 4.  Prescription for gabapentin 100 mg 3 times daily 5.  Return to clinic as needed      Edrick Kins, DPM [facility]  Dr.  Edrick Kins, DPM    [address], [place] [address]                Office [phone]  Fax [phone]

## 2021-05-02 NOTE — Addendum Note (Signed)
Addended by: Edrick Kins on: 05/02/2021 12:18 PM   Modules accepted: Orders

## 2021-05-11 ENCOUNTER — Emergency Department: Payer: 59

## 2021-05-11 ENCOUNTER — Other Ambulatory Visit: Payer: Self-pay

## 2021-05-11 ENCOUNTER — Encounter: Payer: Self-pay | Admitting: *Deleted

## 2021-05-11 ENCOUNTER — Emergency Department
Admission: EM | Admit: 2021-05-11 | Discharge: 2021-05-11 | Disposition: A | Discharge status: Home / Self Care | Payer: 59 | Attending: Emergency Medicine | Admitting: Emergency Medicine

## 2021-05-11 DIAGNOSIS — Z853 Personal history of malignant neoplasm of breast: Secondary | ICD-10-CM | POA: Diagnosis not present

## 2021-05-11 DIAGNOSIS — M545 Low back pain, unspecified: Secondary | ICD-10-CM | POA: Insufficient documentation

## 2021-05-11 DIAGNOSIS — Y92410 Unspecified street and highway as the place of occurrence of the external cause: Secondary | ICD-10-CM | POA: Insufficient documentation

## 2021-05-11 DIAGNOSIS — R519 Headache, unspecified: Secondary | ICD-10-CM | POA: Diagnosis not present

## 2021-05-11 MED ORDER — METHOCARBAMOL 500 MG PO TABS: 500.0000 mg | ORAL_TABLET | Freq: Three times a day (TID) | ORAL | 0 refills | Status: AC | PRN

## 2021-05-11 MED ORDER — MELOXICAM 15 MG PO TABS: 15.0000 mg | ORAL_TABLET | Freq: Every day | ORAL | 2 refills | Status: DC
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
## 2021-05-11 NOTE — Discharge Instructions (Signed)
Take Meloxicam and Robaxin as needed for pain and inflammation.

## 2021-05-15 ENCOUNTER — Encounter: Payer: Self-pay | Admitting: Podiatry

## 2021-05-17 DIAGNOSIS — M79676 Pain in unspecified toe(s): Secondary | ICD-10-CM

## 2021-05-18 ENCOUNTER — Encounter: Payer: 59 | Admitting: Obstetrics and Gynecology

## 2021-05-25 ENCOUNTER — Other Ambulatory Visit: Payer: Self-pay

## 2021-05-25 ENCOUNTER — Encounter: Payer: Self-pay | Admitting: Physical Therapy

## 2021-05-25 ENCOUNTER — Ambulatory Visit: Payer: 59 | Attending: Family Medicine | Admitting: Physical Therapy

## 2021-05-25 DIAGNOSIS — M5441 Lumbago with sciatica, right side: Secondary | ICD-10-CM | POA: Insufficient documentation

## 2021-05-25 DIAGNOSIS — M542 Cervicalgia: Secondary | ICD-10-CM | POA: Diagnosis present

## 2021-05-25 NOTE — Patient Instructions (Signed)
Access Code: [email or web address] URL: [email or web address] Date: 05/25/2021 Prepared by: Shearon Balo  Exercises Standing Lumbar Extension with Counter - 10 x daily - 7 x weekly - 2 sets - 20 reps Prone Press Up - 10 x daily - 7 x weekly - 1 sets - 20 reps Seated Sciatic Tensioner - 2 x daily - 7 x weekly - 2 sets - 15 reps
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
## 2021-05-31 ENCOUNTER — Other Ambulatory Visit: Payer: Self-pay

## 2021-05-31 ENCOUNTER — Ambulatory Visit: Payer: 59 | Admitting: Physical Therapy

## 2021-05-31 ENCOUNTER — Encounter: Payer: Self-pay | Admitting: Physical Therapy

## 2021-05-31 DIAGNOSIS — M5441 Lumbago with sciatica, right side: Secondary | ICD-10-CM

## 2021-05-31 DIAGNOSIS — M542 Cervicalgia: Secondary | ICD-10-CM

## 2021-05-31 NOTE — Therapy (Signed)
[place], [place], [address] Phone: [phone]   Fax:  [phone]  Physical Therapy Treatment  Patient Details  Name: Victoria Greene MRN: [id number] Date of Birth: Apr 20, 1966 Referring Provider (PT): Ziglar, Lincoln Brigham, MD   Encounter Date: 05/31/2021   PT End of Session - 05/31/21 1821     Visit Number 2    Number of Visits 16    Date for PT Re-Evaluation 07/20/21    Authorization Type UHC    PT Start Time 0621    PT Stop Time 0701    PT Time Calculation (min) 40 min    Activity Tolerance Patient tolerated treatment well    Behavior During Therapy [facility] for tasks assessed/performed             Past Medical History:  Diagnosis Date   Breast cancer ([place]) 2010   Right breast cancer - radiation   Frequent urination    GERD (gastroesophageal reflux disease)    H/O   Glaucoma    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   Urinary frequency    Vaginal discharge     Past Surgical History:  Procedure Laterality Date   BREAST BIOPSY Left 08/09/2020   stereo bx of calcs, x marker, path pending   BREAST EXCISIONAL BIOPSY Left 2010   benign   BREAST EXCISIONAL BIOPSY Right 2010   DCIS   BREAST LUMPECTOMY Right 2010   DCIS   BREAST LUMPECTOMY     CESAREAN SECTION     COLONOSCOPY WITH PROPOFOL N/A 05/26/2018   Procedure: COLONOSCOPY WITH PROPOFOL;  Surgeon: Lin Landsman, MD;  Location: [facility] ENDOSCOPY;  Service: Gastroenterology;  Laterality: N/A;   ESOPHAGOGASTRODUODENOSCOPY (EGD) WITH PROPOFOL N/A 05/26/2018   Procedure: ESOPHAGOGASTRODUODENOSCOPY (EGD) WITH PROPOFOL;  Surgeon: Lin Landsman, MD;  Location: [facility] ENDOSCOPY;  Service: Gastroenterology;  Laterality: N/A;   EYE SURGERY  20013   GLAUCOMA TUBE PLACED   LAPAROSCOPIC BILATERAL SALPINGO OOPHERECTOMY Bilateral 06/23/2018   Procedure: LAPAROSCOPIC BILATERAL SALPINGO OOPHORECTOMY;  Surgeon: Brayton Mars, MD;  Location: [facility] ORS;  Service: Gynecology;  Laterality: Bilateral;   VAGINAL HYSTERECTOMY  2009   lsh    There were no vitals filed for this visit.   Subjective Assessment - 05/31/21 1826     Subjective Pt reports that she has resumed walking which she is happy about.  She has been HEP compliant.  Ext does make her low back feel better.  3/10 low back pain and R LE pain today.  Aggs: sitting Eases: ext, rest    Pertinent History Significant PMH: history of cancer                                        PT Education - 05/31/21 1847     Education Details HEP; education on anatomy of disks, vertebra, and nerve roots    Person(s) Educated Patient    Methods Handout;Explanation    Comprehension Verbalized understanding           [facility] Adult PT Treatment/Exercise:  Therapeutic Exercise:  - nu-step L5 4m while taking subjective and planning session with patient - prone press up - 2x20 - Sciatic nerve glide - 2x15 - LTR - 10x - PPT - 10x 5'' hold - Hip adduction ball squeeze - 5'' x10 - alternating clamshell - GTB -  pt in supine - 2x20   Manual therapy, concentrating on increasing extensibility of restricted tissue to reduce discomfort and improve mechanics in functional movement:  - CPA and UPA lumbar spine, pt in prone  Self-care/Home Management: - education on anatomy of disks, vertebra, and nerve roots     PT Short Term Goals - 05/25/21 1638       PT SHORT TERM GOAL #1   Title Victoria Greene will be >75% HEP compliant to improve carryover between sessions and facilitate independent management of condition    Target Date 06/15/21               PT Long Term Goals - 05/25/21 1638       PT LONG TERM GOAL #1   Title Victoria Greene will improve FOTO score from 72 (on evaluation) to 85 as a proxy for functional improvement  target date: 07/19/21      PT LONG TERM GOAL #2   Title Victoria Greene will be able to sit for >120  min, not limited by pain  EVAL: 60 min  target date: 07/19/2021      PT LONG TERM GOAL #3   Title Victoria Greene will be able to walk for 60 min, not limited by pain  EVAL: 15 min  target date: 07/19/2021      PT LONG TERM GOAL #4   Title Victoria Greene will be able to return to moderate workouts at gym, not limited by pain  EVAL: limited  target date: 07/19/21                   Plan - 06/01/21 0847     Clinical Impression Statement Pt reports mild pain reduction following therapy  HEP was updated and reissued to patient; pt educated on HEP, was provided handout, and verbally confirmed understanding of exercises.    Overall, Victoria Greene is progressing well with therapy.  Today we concentrated on core strengthening, lumbar mobility, and pain reduction.  Pt responds well to manual therapy with reduction in pain.  Pt responded more positively to supine nerve glide compared to seated, so this was added to HEP.  Pt will continue to benefit from skilled physical therapy to address remaining deficits and achieve listed goals.  Continue per POC.    Stability/Clinical Decision Making Stable/Uncomplicated    Rehab Potential Good    PT Frequency 2x / week    PT Duration 8 weeks    PT Treatment/Interventions ADLs/Self Care Home Management;Aquatic Therapy;Iontophoresis 4mg /ml Dexamethasone;Traction;Gait training;Therapeutic activities;Therapeutic exercise;Neuromuscular re-education;Manual techniques;Dry needling;Spinal Manipulations;Joint Manipulations    PT Next Visit Plan ext based progression, core progression, help with return to gym as needed    PT [place] and Agree with Plan of Care Patient             Patient will benefit from skilled therapeutic intervention in order to improve the following deficits and impairments:  Decreased endurance, Abnormal gait, Pain  Visit Diagnosis: Bilateral low back pain with right-sided sciatica,  unspecified chronicity  Cervicalgia     Problem List Patient Active Problem List   Diagnosis Date Noted   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   History of endometriosis 04/29/2019   History of recurrent UTIs 04/29/2019   Status post laparoscopy with BSO and adhesio lysis 06/23/2018   Family history of pancreatic cancer-mom 04/24/2018   S/P hysterectomy 04/17/2016   History of breast cancer 04/17/2016   Other  fatigue 04/17/2016   Overweight (BMI 25.0-29.9) 04/17/2016   Vitreous degeneration 11/17/2010   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 [place], [place], [address] Phone: [phone]   Fax:  [phone]  Name: Victoria Greene MRN: [id number] Date of Birth: [birth year]

## 2021-05-31 NOTE — Patient Instructions (Signed)
Access Code: [email or web address] URL: [email or web address] Date: 05/31/2021 Prepared by: Shearon Balo  Exercises Standing Lumbar Extension with Counter - 10 x daily - 7 x weekly - 2 sets - 20 reps Prone Press Up - 10 x daily - 7 x weekly - 1 sets - 20 reps Seated Sciatic Tensioner - 2 x daily - 7 x weekly - 2 sets - 15 reps Supine Sciatic Nerve Glide - 1 x daily - 7 x weekly - 2 sets - 15 reps Supine Posterior Pelvic Tilt - 2 x daily - 7 x weekly - 2 sets - 10 reps - 10 hold

## 2021-06-01 ENCOUNTER — Encounter: Payer: 59 | Admitting: Physical Therapy

## 2021-06-06 ENCOUNTER — Ambulatory Visit: Payer: 59 | Attending: Family Medicine | Admitting: Physical Therapy

## 2021-06-06 ENCOUNTER — Encounter: Payer: Self-pay | Admitting: Physical Therapy

## 2021-06-06 DIAGNOSIS — M542 Cervicalgia: Secondary | ICD-10-CM | POA: Insufficient documentation

## 2021-06-06 DIAGNOSIS — M5441 Lumbago with sciatica, right side: Secondary | ICD-10-CM | POA: Diagnosis not present

## 2021-06-06 NOTE — Therapy (Signed)
[place], [place], [address] Phone: [phone]   Fax:  [phone]  Physical Therapy Treatment  Patient Details  Name: Victoria Greene MRN: [id number] Date of Birth: [birth year] Referring Provider (PT): Ziglar, Lincoln Brigham, MD   Encounter Date: 06/06/2021   PT End of Session - 06/06/21 1821     Visit Number 3    Number of Visits 16    Date for PT Re-Evaluation 07/20/21    Authorization Type UHC    PT Start Time 0620    PT Stop Time 0700    PT Time Calculation (min) 40 min    Activity Tolerance Patient tolerated treatment well    Behavior During Therapy [facility] for tasks assessed/performed             Past Medical History:  Diagnosis Date   Breast cancer ([place]) 2010   Right breast cancer - radiation   Frequent urination    GERD (gastroesophageal reflux disease)    H/O   Glaucoma    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   Urinary frequency    Vaginal discharge     Past Surgical History:  Procedure Laterality Date   BREAST BIOPSY Left 08/09/2020   stereo bx of calcs, x marker, path pending   BREAST EXCISIONAL BIOPSY Left 2010   benign   BREAST EXCISIONAL BIOPSY Right 2010   DCIS   BREAST LUMPECTOMY Right 2010   DCIS   BREAST LUMPECTOMY     CESAREAN SECTION     COLONOSCOPY WITH PROPOFOL N/A 05/26/2018   Procedure: COLONOSCOPY WITH PROPOFOL;  Surgeon: Lin Landsman, MD;  Location: [facility] ENDOSCOPY;  Service: Gastroenterology;  Laterality: N/A;   ESOPHAGOGASTRODUODENOSCOPY (EGD) WITH PROPOFOL N/A 05/26/2018   Procedure: ESOPHAGOGASTRODUODENOSCOPY (EGD) WITH PROPOFOL;  Surgeon: Lin Landsman, MD;  Location: [facility] ENDOSCOPY;  Service: Gastroenterology;  Laterality: N/A;   EYE SURGERY  20013   GLAUCOMA TUBE PLACED   LAPAROSCOPIC BILATERAL SALPINGO OOPHERECTOMY Bilateral 06/23/2018   Procedure: LAPAROSCOPIC BILATERAL SALPINGO OOPHORECTOMY;  Surgeon: Brayton Mars, MD;  Location: [facility] ORS;  Service: Gynecology;  Laterality: Bilateral;   VAGINAL HYSTERECTOMY  2009   lsh    There were no vitals filed for this visit.   Subjective Assessment - 06/06/21 1825     Subjective Pt reports that home exerciess are going well.  some lateral R foot paresthesia is improved with lumbar ext.  She has been a little more sore this week.  She has an order in for a standing desk.  6/10 low back pain with no LE pain.  Aggs: sitting Eases: ext, rest    Pertinent History Significant PMH: history of cancer             [facility] Adult PT Treatment/Exercise:   Therapeutic Exercise:   - nu-step L6 59m while taking subjective and planning session with patient - prone press up - 2x20 - Sciatic nerve glide - x15 - open book - 15x ea - bridge - 10x - LTR - 10x - PPT resisted with foam roller 3x10 hold - Alternating ASLR from foam roller - bird dog (next session) - cat camel (next session)  NOT TODAY: - Hip adduction ball squeeze - 5'' x10 - alternating clamshell - GTB - pt in supine - 2x20     Manual therapy, concentrating on increasing extensibility of restricted tissue to reduce discomfort and improve mechanics in functional movement:   - CPA  and UPA lumbar spine, pt in prone   PT Short Term Goals - 05/25/21 1638       PT SHORT TERM GOAL #1   Title Nevin Bloodgood D Plascencia will be >75% HEP compliant to improve carryover between sessions and facilitate independent management of condition    Target Date 06/15/21               PT Long Term Goals - 05/25/21 1638       PT LONG TERM GOAL #1   Title Nicanor Alcon Upham will improve FOTO score from 72 (on evaluation) to 85 as a proxy for functional improvement  target date: 07/19/21      PT LONG TERM GOAL #2   Title Nancee Brownrigg Marcella will be able to sit for >120 min, not limited by pain  EVAL: 60 min  target date: 07/19/2021      PT LONG TERM GOAL #3   Title Shanise Balch Seamans will be able to walk for 60 min, not  limited by pain  EVAL: 15 min  target date: 07/19/2021      PT LONG TERM GOAL #4   Title Kayela Humphres Rebman will be able to return to moderate workouts at gym, not limited by pain  EVAL: limited  target date: 07/19/21                   Plan - 06/06/21 1840     Clinical Impression Statement Pt reports no increase in baseline pain following therapy  HEP was reviewed, but left unchanged    Overall, [place] Charter is progressing well with therapy.  Today we concentrated on core strengthening and pain reduction.  Pt  generally shows signs of centralization; we will continue to progress core and ext based exercise.  Shows improved HS 90-90 today with reduced neural tension.  Pt will continue to benefit from skilled physical therapy to address remaining deficits and achieve listed goals.  Continue per POC.    Stability/Clinical Decision Making Stable/Uncomplicated    Rehab Potential Good    PT Frequency 2x / week    PT Duration 8 weeks    PT Treatment/Interventions ADLs/Self Care Home Management;Aquatic Therapy;Iontophoresis 4mg /ml Dexamethasone;Traction;Gait training;Therapeutic activities;Therapeutic exercise;Neuromuscular re-education;Manual techniques;Dry needling;Spinal Manipulations;Joint Manipulations    PT Next Visit Plan ext based progression, core progression, help with return to gym as needed    PT [place] and Agree with Plan of Care Patient             Patient will benefit from skilled therapeutic intervention in order to improve the following deficits and impairments:  Decreased endurance, Abnormal gait, Pain  Visit Diagnosis: Bilateral low back pain with right-sided sciatica, unspecified chronicity  Cervicalgia     Problem List Patient Active Problem List   Diagnosis Date Noted   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   History of endometriosis 04/29/2019   History of recurrent UTIs 04/29/2019   Status post laparoscopy with BSO and  adhesio lysis 06/23/2018   Family history of pancreatic cancer-mom 04/24/2018   S/P hysterectomy 04/17/2016   History of breast cancer 04/17/2016   Other fatigue 04/17/2016   Overweight (BMI 25.0-29.9) 04/17/2016   Vitreous degeneration 11/17/2010   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

## 2021-06-09 ENCOUNTER — Encounter: Payer: 59 | Admitting: Physical Therapy

## 2021-06-13 ENCOUNTER — Encounter: Payer: Self-pay | Admitting: Physical Therapy

## 2021-06-13 ENCOUNTER — Other Ambulatory Visit: Payer: Self-pay

## 2021-06-13 ENCOUNTER — Ambulatory Visit: Payer: 59 | Admitting: Physical Therapy

## 2021-06-13 DIAGNOSIS — M542 Cervicalgia: Secondary | ICD-10-CM

## 2021-06-13 DIAGNOSIS — M5441 Lumbago with sciatica, right side: Secondary | ICD-10-CM | POA: Diagnosis not present

## 2021-06-13 NOTE — Therapy (Signed)
[facility] [place], [place], [address] Phone: [phone]   Fax:  [phone]  Physical Therapy Treatment  Patient Details  Name: Victoria Greene MRN: [id number] Date of Birth: 03-12-1966 Referring Provider (PT): Ziglar, Lincoln Brigham, MD   Encounter Date: 06/13/2021   PT End of Session - 06/13/21 1749     Visit Number 4    Number of Visits 16    Date for PT Re-Evaluation 07/20/21    Authorization Type UHC    PT Start Time 6468    PT Stop Time 0321    PT Time Calculation (min) 45 min    Activity Tolerance Patient tolerated treatment well    Behavior During Therapy [facility] for tasks assessed/performed             Past Medical History:  Diagnosis Date   Breast cancer ([place]) 2010   Right breast cancer - radiation   Frequent urination    GERD (gastroesophageal reflux disease)    H/O   Glaucoma    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   Urinary frequency    Vaginal discharge     Past Surgical History:  Procedure Laterality Date   BREAST BIOPSY Left 08/09/2020   stereo bx of calcs, x marker, path pending   BREAST EXCISIONAL BIOPSY Left 2010   benign   BREAST EXCISIONAL BIOPSY Right 2010   DCIS   BREAST LUMPECTOMY Right 2010   DCIS   BREAST LUMPECTOMY     CESAREAN SECTION     COLONOSCOPY WITH PROPOFOL N/A 05/26/2018   Procedure: COLONOSCOPY WITH PROPOFOL;  Surgeon: Lin Landsman, MD;  Location: [facility] ENDOSCOPY;  Service: Gastroenterology;  Laterality: N/A;   ESOPHAGOGASTRODUODENOSCOPY (EGD) WITH PROPOFOL N/A 05/26/2018   Procedure: ESOPHAGOGASTRODUODENOSCOPY (EGD) WITH PROPOFOL;  Surgeon: Lin Landsman, MD;  Location: [facility] ENDOSCOPY;  Service: Gastroenterology;  Laterality: N/A;   EYE SURGERY  20013   GLAUCOMA TUBE PLACED   LAPAROSCOPIC BILATERAL SALPINGO OOPHERECTOMY Bilateral 06/23/2018   Procedure: LAPAROSCOPIC BILATERAL SALPINGO OOPHORECTOMY;  Surgeon: Brayton Mars, MD;  Location: [facility] ORS;  Service: Gynecology;  Laterality: Bilateral;   VAGINAL HYSTERECTOMY  2009   lsh    There were no vitals filed for this visit.   Subjective Assessment - 06/13/21 1750     Subjective Pt reports that he back has been improving.  She has been HEP compliant.  2/10 low back pain.  Aggs: sitting Eases: ext, rest    Pertinent History Significant PMH: history of cancer                PT Education - 06/13/21 1809     Education Details HEP            [facility] Adult PT Treatment/Exercise:   Therapeutic Exercise:   - nu-step L6 20m while taking subjective and planning session with patient - prone press up - 2x20 (not today) - Sciatic nerve glide - x20 - open book - 15x ea - bridge with march - 10x (partial ROM) - LE X-over - 12x ea - PPT resisted with foam roller 3x15 hold - Alternating ASLR from foam roller - 1# -  - bird dog (next session) - cat camel 20x   NOT TODAY: - Hip adduction ball squeeze - 5'' x10 - alternating clamshell - GTB - pt in supine - 2x20     Manual therapy, concentrating on increasing extensibility of restricted tissue to reduce discomfort and  improve mechanics in functional movement:   - CPA and UPA lumbar spine, pt in prone     PT Short Term Goals - 05/25/21 1638       PT SHORT TERM GOAL #1   Title Nevin Bloodgood D [place] will be >75% HEP compliant to improve carryover between sessions and facilitate independent management of condition    Target Date 06/15/21               PT Long Term Goals - 05/25/21 1638       PT LONG TERM GOAL #1   Title Nicanor Alcon Verret will improve FOTO score from 72 (on evaluation) to 85 as a proxy for functional improvement  target date: 07/19/21      PT LONG TERM GOAL #2   Title Kashauna Celmer Branca will be able to sit for >120 min, not limited by pain  EVAL: 60 min  target date: 07/19/2021      PT LONG TERM GOAL #3   Title Bryauna Byrum Ferrick will be able to walk for 60 min, not limited by pain   EVAL: 15 min  target date: 07/19/2021      PT LONG TERM GOAL #4   Title Melah Ebling Defino will be able to return to moderate workouts at gym, not limited by pain  EVAL: limited  target date: 07/19/21                   Plan - 06/13/21 1826     Clinical Impression Statement Pt reports mild pain reduction following therapy  HEP was updated and reissued to patient; pt educated on HEP, was provided handout, and verbally confirmed understanding of exercises.    Overall, KENNEISHA COCHRANE is progressing well with therapy.  Today we concentrated on core strengthening, pain reduction, and ext based exercise .  Pt continues to progress well.  We will continue to progress core strength with ext based program.  Pt will continue to benefit from skilled physical therapy to address remaining deficits and achieve listed goals.  Continue per POC.    Stability/Clinical Decision Making Stable/Uncomplicated    Rehab Potential Good    PT Frequency 2x / week    PT Duration 8 weeks    PT Treatment/Interventions ADLs/Self Care Home Management;Aquatic Therapy;Iontophoresis 4mg /ml Dexamethasone;Traction;Gait training;Therapeutic activities;Therapeutic exercise;Neuromuscular re-education;Manual techniques;Dry needling;Spinal Manipulations;Joint Manipulations    PT Next Visit Plan ext based progression, core progression, help with return to gym as needed    PT [place] and Agree with Plan of Care Patient             Patient will benefit from skilled therapeutic intervention in order to improve the following deficits and impairments:  Decreased endurance, Abnormal gait, Pain  Visit Diagnosis: Bilateral low back pain with right-sided sciatica, unspecified chronicity  Cervicalgia     Problem List Patient Active Problem List   Diagnosis Date Noted   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   History of endometriosis 04/29/2019   History of recurrent UTIs 04/29/2019   Status  post laparoscopy with BSO and adhesio lysis 06/23/2018   Family history of pancreatic cancer-mom 04/24/2018   S/P hysterectomy 04/17/2016   History of breast cancer 04/17/2016   Other fatigue 04/17/2016   Overweight (BMI 25.0-29.9) 04/17/2016   Vitreous degeneration 11/17/2010   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

## 2021-06-13 NOTE — Patient Instructions (Signed)
Access Code: [email or web address] URL: [email or web address] Date: 06/13/2021 Prepared by: Shearon Balo  Exercises Standing Lumbar Extension with Counter - 10 x daily - 7 x weekly - 2 sets - 20 reps Prone Press Up - 10 x daily - 7 x weekly - 1 sets - 20 reps Supine Sciatic Nerve Glide - 1 x daily - 7 x weekly - 2 sets - 15 reps Sidelying Thoracic Rotation with Open Book - 1 x daily - 7 x weekly - 1 sets - 20 reps - 2 hold Cat Cow - 1 x daily - 7 x weekly - 2 sets - 15 reps Bird Dog - 1 x daily - 7 x weekly - 2 sets - 10 reps - 5 hold

## 2021-06-16 ENCOUNTER — Encounter: Payer: 59 | Admitting: Physical Therapy

## 2021-06-16 ENCOUNTER — Ambulatory Visit: Admit: 2021-06-16 | Discharge: 2021-06-17 | Payer: PRIVATE HEALTH INSURANCE

## 2021-06-16 DIAGNOSIS — Z961 Presence of intraocular lens: Principal | ICD-10-CM

## 2021-06-16 DIAGNOSIS — H35351 Cystoid macular degeneration, right eye: Principal | ICD-10-CM

## 2021-06-22 ENCOUNTER — Other Ambulatory Visit: Payer: Self-pay

## 2021-06-22 ENCOUNTER — Encounter: Payer: Self-pay | Admitting: Physical Therapy

## 2021-06-22 ENCOUNTER — Ambulatory Visit: Payer: 59 | Admitting: Physical Therapy

## 2021-06-22 DIAGNOSIS — M5441 Lumbago with sciatica, right side: Secondary | ICD-10-CM

## 2021-06-22 DIAGNOSIS — M542 Cervicalgia: Secondary | ICD-10-CM

## 2021-06-22 NOTE — Therapy (Addendum)
[place], [place], [address] Phone: [phone]   Fax:  [phone]  PHYSICAL THERAPY DISCHARGE SUMMARY  Visits from Start of Care: 5  Current functional level related to goals / functional outcomes: See assessment/goals   Remaining deficits: See assessment/goals   Education / Equipment: HEP and D/C plans  Patient agrees to discharge. Patient goals were met. Patient is being discharged due to meeting the stated rehab goals.   Physical Therapy Treatment  Patient Details  Name: BEATA BEASON MRN: [id number] Date of Birth: 1965/11/29 Referring Provider (PT): Ziglar, Lincoln Brigham, MD   Encounter Date: 06/22/2021   PT End of Session - 06/22/21 1740     Visit Number 5    Number of Visits 16    Date for PT Re-Evaluation 07/20/21    Authorization Type UHC    PT Start Time 7322    PT Stop Time 0254    PT Time Calculation (min) 32 min    Activity Tolerance Patient tolerated treatment well    Behavior During Therapy [facility] for tasks assessed/performed             Past Medical History:  Diagnosis Date   Breast cancer ([place]) 2010   Right breast cancer - radiation   Frequent urination    GERD (gastroesophageal reflux disease)    H/O   Glaucoma    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   Urinary frequency    Vaginal discharge     Past Surgical History:  Procedure Laterality Date   BREAST BIOPSY Left 08/09/2020   stereo bx of calcs, x marker, path pending   BREAST EXCISIONAL BIOPSY Left 2010   benign   BREAST EXCISIONAL BIOPSY Right 2010   DCIS   BREAST LUMPECTOMY Right 2010   DCIS   BREAST LUMPECTOMY     CESAREAN SECTION     COLONOSCOPY WITH PROPOFOL N/A 05/26/2018   Procedure: COLONOSCOPY WITH PROPOFOL;  Surgeon: Lin Landsman, MD;  Location: [facility] ENDOSCOPY;  Service: Gastroenterology;  Laterality: N/A;   ESOPHAGOGASTRODUODENOSCOPY (EGD) WITH PROPOFOL N/A  05/26/2018   Procedure: ESOPHAGOGASTRODUODENOSCOPY (EGD) WITH PROPOFOL;  Surgeon: Lin Landsman, MD;  Location: [facility] ENDOSCOPY;  Service: Gastroenterology;  Laterality: N/A;   EYE SURGERY  20013   GLAUCOMA TUBE PLACED   LAPAROSCOPIC BILATERAL SALPINGO OOPHERECTOMY Bilateral 06/23/2018   Procedure: LAPAROSCOPIC BILATERAL SALPINGO OOPHORECTOMY;  Surgeon: Brayton Mars, MD;  Location: [facility] ORS;  Service: Gynecology;  Laterality: Bilateral;   VAGINAL HYSTERECTOMY  2009   lsh    There were no vitals filed for this visit.   Subjective Assessment - 06/22/21 1743     Subjective Pt reports that she is feeling much better.  0/10 low back pain.  Aggs: sitting Eases: ext, rest    Pertinent History Significant PMH: history of cancer              [facility] Adult PT Treatment/Exercise:   Therapeutic Exercise:   - nu-step L6 36mwhile taking subjective and planning session with patient - Sciatic nerve glide - x20 - open book - 10x ea - LE X-over - 12x ea - PPT resisted with foam roller 3x15 hold - bird dog - 10'' x10 - cat camel 20x   NOT TODAY: - Hip adduction ball squeeze - 5'' x10 - alternating clamshell - GTB - pt in supine - 2x20  Therapeutic Activity - collecting information for goals, checking progress, and reviewing with  patient    PT Short Term Goals - 06/22/21 1744       PT SHORT TERM GOAL #1   Title Nevin Bloodgood D [place] will be >75% HEP compliant to improve carryover between sessions and facilitate independent management of condition    Status Achieved    Target Date 06/15/21               PT Long Term Goals - 06/22/21 1745       PT LONG TERM GOAL #1   Title Nicanor Alcon [place]ak will improve FOTO score from 72 (on evaluation) to 85 as a proxy for functional improvement  target date: 07/19/21    Baseline 10/20: 79    Status Partially Met      PT LONG TERM GOAL #2   Title Laverta Harnisch [place] will be able to sit for >120 min, not limited by pain  EVAL: 60 min  target  date: 07/19/2021    Baseline 10/20: 4 hours    Status Achieved      PT LONG TERM GOAL #3   Title Serene Kopf Davies will be able to walk for 60 min, not limited by pain  EVAL: 15 min  target date: 07/19/2021    Baseline 10/20: not limited    Status Achieved      PT LONG TERM GOAL #4   Title Bernadett Milian Garciaperez will be able to return to moderate workouts at gym, not limited by pain  EVAL: limited  target date: 07/19/21    Baseline 10/20: feels confident she can return    Status Achieved                   Plan - 06/22/21 1810     Clinical Impression Statement KHRISTA BRAUN has progressed very well with therapy.  Improved impairments include: pain and core strength.  Functional improvements include: ability to stand, walk, sit, and work while not limited by pain.  Progressions needed include: continued HEP.  Barriers to progress include: none.  Please see baseline and/or status section in "Goals" for specific progress on short term and long term goals established at evaluation.  I recommend D/C home with HEP; pt agrees with plan.    Stability/Clinical Decision Making Stable/Uncomplicated    Rehab Potential Good    PT Frequency 2x / week    PT Duration 8 weeks    PT Treatment/Interventions ADLs/Self Care Home Management;Aquatic Therapy;Iontophoresis 66m/ml Dexamethasone;Traction;Gait training;Therapeutic activities;Therapeutic exercise;Neuromuscular re-education;Manual techniques;Dry needling;Spinal Manipulations;Joint Manipulations    PT Next Visit Plan ext based progression, core progression, help with return to gym as needed    PT [place] Agree with Plan of Care Patient             Patient will benefit from skilled therapeutic intervention in order to improve the following deficits and impairments:  Decreased endurance, Abnormal gait, Pain  Visit Diagnosis: Bilateral low back pain with right-sided sciatica, unspecified  chronicity  Cervicalgia     Problem List Patient Active Problem List   Diagnosis Date Noted   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   History of endometriosis 04/29/2019   History of recurrent UTIs 04/29/2019   Status post laparoscopy with BSO and adhesio lysis 06/23/2018   Family history of pancreatic cancer-mom 04/24/2018   S/P hysterectomy 04/17/2016   History of breast cancer 04/17/2016   Other fatigue 04/17/2016   Overweight (BMI 25.0-29.9) 04/17/2016   Vitreous degeneration 11/17/2010   Senile nuclear  sclerosis 11/17/2010   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

## 2021-06-23 ENCOUNTER — Encounter: Payer: 59 | Admitting: Physical Therapy

## 2021-06-24 ENCOUNTER — Encounter: Payer: Self-pay | Admitting: Emergency Medicine

## 2021-06-24 ENCOUNTER — Other Ambulatory Visit: Payer: Self-pay

## 2021-06-24 ENCOUNTER — Emergency Department
Admission: EM | Admit: 2021-06-24 | Discharge: 2021-06-25 | Disposition: A | Discharge status: Home / Self Care | Payer: 59 | Attending: Emergency Medicine | Admitting: Emergency Medicine

## 2021-06-24 DIAGNOSIS — R209 Unspecified disturbances of skin sensation: Secondary | ICD-10-CM | POA: Insufficient documentation

## 2021-06-24 DIAGNOSIS — Z5321 Procedure and treatment not carried out due to patient leaving prior to being seen by health care provider: Secondary | ICD-10-CM | POA: Insufficient documentation

## 2021-06-24 DIAGNOSIS — Y92410 Unspecified street and highway as the place of occurrence of the external cause: Secondary | ICD-10-CM | POA: Diagnosis not present

## 2021-06-24 DIAGNOSIS — M79673 Pain in unspecified foot: Secondary | ICD-10-CM | POA: Insufficient documentation
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
## 2021-06-25 ENCOUNTER — Ambulatory Visit
Admit: 2021-06-25 | Discharge: 2021-06-25 | Disposition: A | Discharge status: Home / Self Care | Payer: PRIVATE HEALTH INSURANCE | Attending: Emergency Medicine

## 2021-06-25 DIAGNOSIS — G629 Polyneuropathy, unspecified: Principal | ICD-10-CM

## 2021-06-25 DIAGNOSIS — R202 Paresthesia of skin: Principal | ICD-10-CM

## 2021-07-07 ENCOUNTER — Encounter: Payer: 59 | Admitting: Obstetrics and Gynecology

## 2021-07-17 ENCOUNTER — Ambulatory Visit: Admit: 2021-07-17 | Discharge: 2021-07-18 | Payer: PRIVATE HEALTH INSURANCE

## 2021-07-17 DIAGNOSIS — H35351 Cystoid macular degeneration, right eye: Principal | ICD-10-CM

## 2021-08-18 ENCOUNTER — Ambulatory Visit: Admit: 2021-08-18 | Discharge: 2021-08-19 | Payer: PRIVATE HEALTH INSURANCE

## 2021-08-18 DIAGNOSIS — Z961 Presence of intraocular lens: Principal | ICD-10-CM

## 2021-08-18 DIAGNOSIS — H35351 Cystoid macular degeneration, right eye: Principal | ICD-10-CM

## 2021-08-31 ENCOUNTER — Encounter: Payer: Self-pay | Admitting: Obstetrics and Gynecology

## 2021-08-31 ENCOUNTER — Other Ambulatory Visit: Payer: Self-pay

## 2021-08-31 ENCOUNTER — Ambulatory Visit (INDEPENDENT_AMBULATORY_CARE_PROVIDER_SITE_OTHER): Payer: 59 | Admitting: Obstetrics and Gynecology

## 2021-08-31 ENCOUNTER — Other Ambulatory Visit (HOSPITAL_COMMUNITY)
Admission: RE | Admit: 2021-08-31 | Discharge: 2021-08-31 | Disposition: A | Discharge status: Home / Self Care | Payer: 59 | Source: Ambulatory Visit | Attending: Obstetrics and Gynecology | Admitting: Obstetrics and Gynecology

## 2021-08-31 VITALS — BP 100/84 | HR 71 | Resp 16 | Ht 60.0 in | Wt 153.2 lb

## 2021-08-31 DIAGNOSIS — K5909 Other constipation: Secondary | ICD-10-CM

## 2021-08-31 DIAGNOSIS — Z853 Personal history of malignant neoplasm of breast: Secondary | ICD-10-CM

## 2021-08-31 DIAGNOSIS — N958 Other specified menopausal and perimenopausal disorders: Secondary | ICD-10-CM

## 2021-08-31 DIAGNOSIS — Z1231 Encounter for screening mammogram for malignant neoplasm of breast: Secondary | ICD-10-CM | POA: Diagnosis not present

## 2021-08-31 DIAGNOSIS — Z124 Encounter for screening for malignant neoplasm of cervix: Secondary | ICD-10-CM | POA: Insufficient documentation

## 2021-08-31 DIAGNOSIS — E663 Overweight: Secondary | ICD-10-CM

## 2021-08-31 DIAGNOSIS — Z01419 Encounter for gynecological examination (general) (routine) without abnormal findings: Secondary | ICD-10-CM

## 2021-08-31 MED ORDER — TRULANCE 3 MG PO TABS: 3.0000 mg | ORAL_TABLET | Freq: Every day | ORAL | 3 refills | Status: DC
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
## 2021-09-06 LAB — CYTOLOGY - PAP
Comment: NEGATIVE
Diagnosis: NEGATIVE
High risk HPV: NEGATIVE

## 2021-09-08 ENCOUNTER — Ambulatory Visit
Admission: RE | Admit: 2021-09-08 | Discharge: 2021-09-08 | Disposition: A | Discharge status: Home / Self Care | Payer: 59 | Source: Ambulatory Visit | Attending: Obstetrics and Gynecology | Admitting: Obstetrics and Gynecology

## 2021-09-08 ENCOUNTER — Other Ambulatory Visit: Payer: Self-pay

## 2021-09-08 DIAGNOSIS — Z1231 Encounter for screening mammogram for malignant neoplasm of breast: Secondary | ICD-10-CM | POA: Insufficient documentation

## 2021-09-09 ENCOUNTER — Other Ambulatory Visit: Payer: Self-pay | Admitting: Obstetrics and Gynecology

## 2021-09-09 ENCOUNTER — Encounter: Payer: Self-pay | Admitting: Obstetrics and Gynecology

## 2021-09-09 DIAGNOSIS — R928 Other abnormal and inconclusive findings on diagnostic imaging of breast: Secondary | ICD-10-CM

## 2021-09-11 ENCOUNTER — Other Ambulatory Visit: Payer: Self-pay | Admitting: Obstetrics and Gynecology

## 2021-09-11 DIAGNOSIS — R928 Other abnormal and inconclusive findings on diagnostic imaging of breast: Secondary | ICD-10-CM

## 2021-09-11 DIAGNOSIS — R921 Mammographic calcification found on diagnostic imaging of breast: Secondary | ICD-10-CM

## 2021-09-18 ENCOUNTER — Other Ambulatory Visit: Payer: Self-pay

## 2021-09-18 ENCOUNTER — Ambulatory Visit
Admission: RE | Admit: 2021-09-18 | Discharge: 2021-09-18 | Disposition: A | Discharge status: Home / Self Care | Payer: 59 | Source: Ambulatory Visit | Attending: Obstetrics and Gynecology | Admitting: Obstetrics and Gynecology

## 2021-09-18 DIAGNOSIS — R921 Mammographic calcification found on diagnostic imaging of breast: Secondary | ICD-10-CM | POA: Insufficient documentation

## 2021-09-18 DIAGNOSIS — R928 Other abnormal and inconclusive findings on diagnostic imaging of breast: Secondary | ICD-10-CM | POA: Diagnosis present

## 2021-10-16 ENCOUNTER — Ambulatory Visit: Admit: 2021-10-16 | Discharge: 2021-10-17 | Payer: PRIVATE HEALTH INSURANCE

## 2021-10-16 DIAGNOSIS — H30891 Other chorioretinal inflammations, right eye: Secondary | ICD-10-CM | POA: Insufficient documentation

## 2021-10-16 DIAGNOSIS — H35351 Cystoid macular degeneration, right eye: Principal | ICD-10-CM

## 2021-10-16 DIAGNOSIS — H43813 Vitreous degeneration, bilateral: Principal | ICD-10-CM

## 2021-10-16 DIAGNOSIS — H269 Unspecified cataract: Principal | ICD-10-CM

## 2021-10-16 DIAGNOSIS — T380X5A Adverse effect of glucocorticoids and synthetic analogues, initial encounter: Principal | ICD-10-CM

## 2021-10-16 DIAGNOSIS — H4061X0 Glaucoma secondary to drugs, right eye, stage unspecified: Principal | ICD-10-CM

## 2021-10-16 DIAGNOSIS — Z961 Presence of intraocular lens: Principal | ICD-10-CM

## 2021-12-25 ENCOUNTER — Ambulatory Visit: Admit: 2021-12-25 | Discharge: 2021-12-26 | Payer: PRIVATE HEALTH INSURANCE

## 2021-12-25 DIAGNOSIS — T380X5A Adverse effect of glucocorticoids and synthetic analogues, initial encounter: Principal | ICD-10-CM

## 2021-12-25 DIAGNOSIS — H4061X0 Glaucoma secondary to drugs, right eye, stage unspecified: Principal | ICD-10-CM

## 2021-12-25 DIAGNOSIS — H30891 Other chorioretinal inflammations, right eye: Principal | ICD-10-CM

## 2021-12-25 DIAGNOSIS — H35351 Cystoid macular degeneration, right eye: Principal | ICD-10-CM

## 2021-12-27 ENCOUNTER — Encounter: Payer: Self-pay | Admitting: Obstetrics and Gynecology

## 2021-12-27 ENCOUNTER — Other Ambulatory Visit: Payer: Self-pay

## 2021-12-27 ENCOUNTER — Other Ambulatory Visit: Payer: Self-pay | Admitting: Obstetrics and Gynecology

## 2021-12-27 DIAGNOSIS — R921 Mammographic calcification found on diagnostic imaging of breast: Secondary | ICD-10-CM

## 2021-12-27 DIAGNOSIS — R928 Other abnormal and inconclusive findings on diagnostic imaging of breast: Secondary | ICD-10-CM

## 2021-12-29 ENCOUNTER — Other Ambulatory Visit: Payer: Self-pay | Admitting: Obstetrics and Gynecology

## 2021-12-29 DIAGNOSIS — R921 Mammographic calcification found on diagnostic imaging of breast: Secondary | ICD-10-CM

## 2022-01-10 ENCOUNTER — Ambulatory Visit: Admit: 2022-01-10 | Discharge: 2022-01-11 | Payer: PRIVATE HEALTH INSURANCE

## 2022-01-24 ENCOUNTER — Ambulatory Visit: Admit: 2022-01-24 | Discharge: 2022-01-25 | Payer: PRIVATE HEALTH INSURANCE

## 2022-01-24 DIAGNOSIS — H35351 Cystoid macular degeneration, right eye: Principal | ICD-10-CM

## 2022-01-24 DIAGNOSIS — H3091 Unspecified chorioretinal inflammation, right eye: Principal | ICD-10-CM

## 2022-02-01 ENCOUNTER — Encounter: Payer: Self-pay | Admitting: Obstetrics and Gynecology

## 2022-02-07 ENCOUNTER — Other Ambulatory Visit: Payer: Self-pay

## 2022-02-07 ENCOUNTER — Encounter: Payer: Self-pay | Admitting: Gastroenterology

## 2022-02-07 DIAGNOSIS — Z86010 Personal history of colonic polyps: Secondary | ICD-10-CM

## 2022-02-07 MED ORDER — NA SULFATE-K SULFATE-MG SULF 17.5-3.13-1.6 GM/177ML PO SOLN: 1.0000 | Freq: Once | ORAL | 0 refills | Status: AC

## 2022-02-07 NOTE — Progress Notes (Signed)
Gastroenterology Pre-Procedure Review  Request Date: 02/28/2022 Requesting Physician: Dr. Marius Ditch   PATIENT REVIEW QUESTIONS: The patient responded to the following health history questions as indicated:    1. Are you having any GI issues? no 2. Do you have a personal history of Polyps? yes (last colonoscopy) 3. Do you have a family history of Colon Cancer or Polyps? no 4. Diabetes Mellitus? no 5. Joint replacements in the past 12 months?no 6. Major health problems in the past 3 months?no 7. Any artificial heart valves, MVP, or defibrillator?no    MEDICATIONS & ALLERGIES:    Patient reports the following regarding taking any anticoagulation/antiplatelet therapy:   Plavix, Coumadin, Eliquis, Xarelto, Lovenox, Pradaxa, Brilinta, or Effient? no Aspirin? no  Patient confirms/reports the following medications:  Current Outpatient Medications  Medication Sig Dispense Refill   acetaminophen (TYLENOL) 500 MG tablet Take 1,000 mg by mouth every 6 (six) hours as needed for moderate pain or headache.     estradiol (ESTRACE) 0.1 MG/GM vaginal cream Apply 1/2- 1 gram vaginally three times a week 42.5 g 6   FIBER ADULT GUMMIES PO Take 3 each by mouth daily.     fluticasone (FLONASE) 50 MCG/ACT nasal spray Place 2 sprays into both nostrils at bedtime as needed for allergies.      gabapentin (NEURONTIN) 100 MG capsule Take 1 capsule (100 mg total) by mouth 3 (three) times daily. 90 capsule 3   loratadine (CLARITIN) 10 MG tablet Take 10 mg by mouth daily.     methylPREDNISolone (MEDROL DOSEPAK) 4 MG TBPK tablet 6 day dose pack - take as directed 21 tablet 0   Multiple Vitamin (MULTIVITAMIN) capsule Take 1 capsule by mouth daily.     Plecanatide (TRULANCE) 3 MG TABS Take 3 mg by mouth daily. 90 tablet 3   No current facility-administered medications for this visit.    Patient confirms/reports the following allergies:  Allergies  Allergen Reactions   Nickel Rash   Shellfish Allergy Rash    Shellfish-Derived Products Rash    No orders of the defined types were placed in this encounter.   AUTHORIZATION INFORMATION Primary Insurance: 1D#: Group #:  Secondary Insurance: 1D#: Group #:  SCHEDULE INFORMATION: Date: 02/28/2022 Time: Location: [facility]

## 2022-02-09 ENCOUNTER — Ambulatory Visit: Payer: 59 | Admitting: Orthopaedic Surgery

## 2022-02-21 ENCOUNTER — Ambulatory Visit: Admit: 2022-02-21 | Discharge: 2022-02-22 | Payer: PRIVATE HEALTH INSURANCE

## 2022-02-23 ENCOUNTER — Emergency Department
Admission: EM | Admit: 2022-02-23 | Discharge: 2022-02-23 | Disposition: A | Discharge status: Home / Self Care | Payer: 59 | Attending: Emergency Medicine | Admitting: Emergency Medicine

## 2022-02-23 ENCOUNTER — Other Ambulatory Visit: Payer: Self-pay

## 2022-02-23 ENCOUNTER — Encounter: Payer: Self-pay | Admitting: Emergency Medicine

## 2022-02-23 DIAGNOSIS — R531 Weakness: Secondary | ICD-10-CM | POA: Diagnosis not present

## 2022-02-23 DIAGNOSIS — R42 Dizziness and giddiness: Secondary | ICD-10-CM | POA: Diagnosis not present

## 2022-02-23 DIAGNOSIS — R791 Abnormal coagulation profile: Secondary | ICD-10-CM | POA: Insufficient documentation

## 2022-02-23 LAB — CBC
HCT: 42.7 % (ref 36.0–46.0)
Hemoglobin: 13.4 g/dL (ref 12.0–15.0)
MCH: 27.8 pg (ref 26.0–34.0)
MCHC: 31.4 g/dL (ref 30.0–36.0)
MCV: 88.6 fL (ref 80.0–100.0)
Platelets: 142 10*3/uL — ABNORMAL LOW (ref 150–400)
RBC: 4.82 MIL/uL (ref 3.87–5.11)
RDW: 13.9 % (ref 11.5–15.5)
WBC: 5.4 10*3/uL (ref 4.0–10.5)
nRBC: 0 % (ref 0.0–0.2)

## 2022-02-23 LAB — COMPREHENSIVE METABOLIC PANEL
ALT: 20 U/L (ref 0–44)
AST: 20 U/L (ref 15–41)
Albumin: 4.4 g/dL (ref 3.5–5.0)
Alkaline Phosphatase: 81 U/L (ref 38–126)
Anion gap: 6 (ref 5–15)
BUN: 14 mg/dL (ref 6–20)
CO2: 29 mmol/L (ref 22–32)
Calcium: 9.7 mg/dL (ref 8.9–10.3)
Chloride: 107 mmol/L (ref 98–111)
Creatinine, Ser: 0.76 mg/dL (ref 0.44–1.00)
GFR, Estimated: >60 mL/min (ref >60)
Glucose, Bld: 95 mg/dL (ref 70–99)
Potassium: 3.5 mmol/L (ref 3.5–5.1)
Sodium: 142 mmol/L (ref 135–145)
Total Bilirubin: 1.3 mg/dL — ABNORMAL HIGH (ref 0.3–1.2)
Total Protein: 8.1 g/dL (ref 6.5–8.1)

## 2022-02-23 LAB — URINALYSIS, COMPLETE (UACMP) WITH MICROSCOPIC
Bacteria, UA: NONE SEEN
Bilirubin Urine: NEGATIVE
Glucose, UA: NEGATIVE mg/dL
Hgb urine dipstick: NEGATIVE
Ketones, ur: NEGATIVE mg/dL
Leukocytes,Ua: NEGATIVE
Nitrite: NEGATIVE
Protein, ur: NEGATIVE mg/dL
Specific Gravity, Urine: 1.017 (ref 1.005–1.030)
pH: 5 (ref 5.0–8.0)

## 2022-02-23 LAB — PROTIME-INR
INR: 1.1 (ref 0.8–1.2)
Prothrombin Time: 13.9 seconds (ref 11.4–15.2)

## 2022-02-23 LAB — DIFFERENTIAL
Abs Immature Granulocytes: 0.01 10*3/uL (ref 0.00–0.07)
Basophils Absolute: 0 10*3/uL (ref 0.0–0.1)
Basophils Relative: 1 %
Eosinophils Absolute: 0.1 10*3/uL (ref 0.0–0.5)
Eosinophils Relative: 1 %
Immature Granulocytes: 0 %
Lymphocytes Relative: 24 %
Lymphs Abs: 1.3 10*3/uL (ref 0.7–4.0)
Monocytes Absolute: 0.4 10*3/uL (ref 0.1–1.0)
Monocytes Relative: 7 %
Neutro Abs: 3.6 10*3/uL (ref 1.7–7.7)
Neutrophils Relative %: 67 %

## 2022-02-23 LAB — APTT: aPTT: 32 seconds (ref 24–36)

## 2022-02-23 LAB — TROPONIN I (HIGH SENSITIVITY): Troponin I (High Sensitivity): <2 ng/L (ref <18)

## 2022-02-23 LAB — CBG MONITORING, ED: Glucose-Capillary: 83 mg/dL (ref 70–99)

## 2022-02-23 NOTE — ED Triage Notes (Signed)
Pt to ED from [place] [facility]. Pt states that about a month ago she had an injection in her right eye for macular edema. Pt states that they tried a different medication from the one she normally gets and that she had an allergic reaction to it. Pt states that she saw her eye doctor on Tuesday and told him that she had started having some dizziness. Pt states the dizziness is intermittent. Pt describes it as more of a light headedness. Pt denies nausea or vomiting and states that sometimes the dizziness is worse when standing. Pt denies numbness or tingling and states that there is no changes in her vision from her baseline.

## 2022-02-27 ENCOUNTER — Encounter: Payer: Self-pay | Admitting: Gastroenterology

## 2022-02-28 ENCOUNTER — Ambulatory Visit
Admission: RE | Admit: 2022-02-28 | Discharge: 2022-02-28 | Disposition: A | Discharge status: Home / Self Care | Payer: 59 | Source: Ambulatory Visit | Attending: Gastroenterology | Admitting: Gastroenterology

## 2022-02-28 ENCOUNTER — Encounter: Payer: Self-pay | Admitting: Gastroenterology

## 2022-02-28 ENCOUNTER — Encounter
Admission: RE | Disposition: A | Discharge status: Home / Self Care | Payer: Self-pay | Source: Ambulatory Visit | Attending: Gastroenterology

## 2022-02-28 ENCOUNTER — Ambulatory Visit: Payer: 59 | Admitting: Anesthesiology

## 2022-02-28 DIAGNOSIS — K5730 Diverticulosis of large intestine without perforation or abscess without bleeding: Secondary | ICD-10-CM | POA: Insufficient documentation

## 2022-02-28 DIAGNOSIS — Z853 Personal history of malignant neoplasm of breast: Secondary | ICD-10-CM | POA: Insufficient documentation

## 2022-02-28 DIAGNOSIS — Z860100 Personal history of colon polyps, unspecified: Secondary | ICD-10-CM

## 2022-02-28 DIAGNOSIS — Z09 Encounter for follow-up examination after completed treatment for conditions other than malignant neoplasm: Secondary | ICD-10-CM | POA: Diagnosis present

## 2022-02-28 DIAGNOSIS — Z923 Personal history of irradiation: Secondary | ICD-10-CM | POA: Diagnosis not present

## 2022-02-28 DIAGNOSIS — K219 Gastro-esophageal reflux disease without esophagitis: Secondary | ICD-10-CM | POA: Diagnosis not present

## 2022-02-28 HISTORY — PX: COLONOSCOPY WITH PROPOFOL: SHX5780

## 2022-02-28 SURGERY — COLONOSCOPY WITH PROPOFOL
Anesthesia: General

## 2022-02-28 MED ORDER — SODIUM CHLORIDE 0.9 % IV SOLN
INTRAVENOUS | Status: DC
  Administered 2022-02-28: 20 mL/h via INTRAVENOUS

## 2022-02-28 MED ORDER — PROPOFOL 10 MG/ML IV BOLUS
INTRAVENOUS | Status: AC
  Filled 2022-02-28: qty 40

## 2022-02-28 MED ORDER — PROPOFOL 10 MG/ML IV BOLUS
INTRAVENOUS | Status: DC | PRN
  Administered 2022-02-28: 40 mg via INTRAVENOUS
  Administered 2022-02-28: 50 mg via INTRAVENOUS
  Administered 2022-02-28: 100 mg via INTRAVENOUS
  Administered 2022-02-28: 50 mg via INTRAVENOUS

## 2022-02-28 NOTE — Transfer of Care (Signed)
Immediate Anesthesia Transfer of Care Note  Patient: Victoria Greene  Procedure(s) Performed: COLONOSCOPY WITH PROPOFOL  Patient Location: Endoscopy Unit  Anesthesia Type:General  Level of Consciousness: drowsy  Airway & Oxygen Therapy: Patient Spontanous Breathing and Patient connected to nasal cannula oxygen  Post-op Assessment: Report given to RN, Post -op Vital signs reviewed and stable and Patient moving all extremities  Post vital signs: Reviewed and stable  Last Vitals:  Vitals Value Taken Time  BP    Temp    Pulse    Resp    SpO2      Last Pain:  Vitals:   02/28/22 1102  TempSrc: Temporal  PainSc: 0-No pain         Complications: No notable events documented.

## 2022-02-28 NOTE — H&P (Signed)
Victoria Darby, MD [address]  [place]  [place], [place] [address]  Main: [phone]  Fax: [phone] Pager: [phone]  Primary Care Physician:  [facility] Primary Gastroenterologist:  Dr. Cephas Greene  Pre-Procedure History & Physical: HPI:  Victoria Greene is a 56 y.o. female is here for an colonoscopy.   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   Urinary frequency    Vaginal discharge     Past Surgical History:  Procedure Laterality Date   BREAST BIOPSY Left 08/09/2020   stereo bx of calcs, x marker, path pending   BREAST EXCISIONAL BIOPSY Left 2010   benign   BREAST EXCISIONAL BIOPSY Right 2010   DCIS   BREAST LUMPECTOMY Right 2010   DCIS   BREAST LUMPECTOMY     CESAREAN SECTION     COLONOSCOPY WITH PROPOFOL N/A 05/26/2018   Procedure: COLONOSCOPY WITH PROPOFOL;  Surgeon: Lin Landsman, MD;  Location: [facility];  Service: Gastroenterology;  Laterality: N/A;   ESOPHAGOGASTRODUODENOSCOPY (EGD) WITH PROPOFOL N/A 05/26/2018   Procedure: ESOPHAGOGASTRODUODENOSCOPY (EGD) WITH PROPOFOL;  Surgeon: Lin Landsman, MD;  Location: [facility] ENDOSCOPY;  Service: Gastroenterology;  Laterality: N/A;   EYE SURGERY  20013   GLAUCOMA TUBE PLACED   LAPAROSCOPIC BILATERAL SALPINGO OOPHERECTOMY Bilateral 06/23/2018   Procedure: LAPAROSCOPIC BILATERAL SALPINGO OOPHORECTOMY;  Surgeon: Brayton Mars, MD;  Location: [facility] ORS;  Service: Gynecology;  Laterality: Bilateral;   VAGINAL HYSTERECTOMY  2009   lsh    Prior to Admission medications   Medication Sig Start Date End Date Taking? Authorizing Provider  acetaminophen (TYLENOL) 500 MG tablet Take 1,000 mg by mouth every 6 (six) hours as needed for moderate pain or headache.   Yes [provider]  estradiol (ESTRACE) 0.1 MG/GM vaginal cream Apply 1/2- 1 gram vaginally three times a week 03/21/21  Yes Rubie Maid, MD  FIBER ADULT GUMMIES PO Take 3 each by mouth daily.   Yes [provider]  fluticasone (FLONASE) 50 MCG/ACT nasal spray Place 2 sprays into both nostrils at bedtime as needed for allergies.  03/20/17  Yes [provider]  gabapentin (NEURONTIN) 100 MG capsule Take 1 capsule (100 mg total) by mouth 3 (three) times daily. 05/02/21  Yes Edrick Kins, DPM  loratadine (CLARITIN) 10 MG tablet Take 10 mg by mouth daily.   Yes [provider]  methylPREDNISolone (MEDROL DOSEPAK) 4 MG TBPK tablet 6 day dose pack - take as directed 05/02/21  Yes Edrick Kins, DPM  Multiple Vitamin (MULTIVITAMIN) capsule Take 1 capsule by mouth daily.   Yes [provider]  Plecanatide (TRULANCE) 3 MG TABS Take 3 mg by mouth daily. 08/31/21  Yes Rubie Maid, MD    Allergies as of 02/07/2022 - Review Complete 02/07/2022  Allergen Reaction Noted   Nickel Rash 04/24/2018   Shellfish allergy Rash 12/31/2012   Shellfish-derived products Rash 02/27/2016    Family History  Problem Relation Age of Onset   Breast cancer Maternal Aunt 38   Breast cancer Maternal Aunt 46   Heart disease Father    Thyroid disease Mother    Pancreatic cancer Mother    Ovarian cancer Neg Hx    Colon cancer Neg Hx  Diabetes Neg Hx     Social History   Socioeconomic History   Marital status: Divorced    Spouse name: Not on file   Number of children: Not on file   Years of education: Not on file   Highest education level: Not on file  Occupational History   Not on file  Tobacco Use   Smoking status: Never   Smokeless tobacco: Never  Vaping Use   Vaping Use: Never used  Substance and Sexual Activity   Alcohol use: Not Currently    Comment: occas   Drug use: No   Sexual activity: Yes    Birth control/protection: None  Other Topics Concern   Not on file   Social History Narrative   Not on file   Social Determinants of Health   Financial Resource Strain: Not on file  Food Insecurity: Not on file  Transportation Needs: Not on file  Physical Activity: Sufficiently Active (04/24/2018)   Exercise Vital Sign    Days of Exercise per Week: 3 days    Minutes of Exercise per Session: 60 min  Stress: Not on file  Social Connections: Not on file  Intimate Partner Violence: Not on file    Review of Systems: See HPI, otherwise negative ROS  Physical Exam: BP (!) 140/94   Pulse 65   Temp (!) 96.9 F (36.1 C) (Temporal)   Resp 20   Ht '5\' 1"'$  (1.549 m)   Wt 63.5 kg   SpO2 100%   BMI 26.45 kg/m  General:   Alert,  pleasant and cooperative in NAD Head:  Normocephalic and atraumatic. Neck:  Supple; no masses or thyromegaly. Lungs:  Clear throughout to auscultation.    Heart:  Regular rate and rhythm. Abdomen:  Soft, nontender and nondistended. Normal bowel sounds, without guarding, and without rebound.   Neurologic:  Alert and  oriented x4;  grossly normal neurologically.  Impression/Plan: Victoria Greene is here for an colonoscopy to be performed for h/o colon adenomas  Risks, benefits, limitations, and alternatives regarding  colonoscopy have been reviewed with the patient.  Questions have been answered.  All parties agreeable.   Sherri Sear, MD  02/28/2022, 11:22 AM

## 2022-02-28 NOTE — Op Note (Signed)
[facility] Gastroenterology Patient Name: Victoria Greene Procedure Date: 02/28/2022 11:36 AM MRN: [id number] Account #: [id number] Date of Birth: Oct 11, 1965 Admit Type: Outpatient Age: 56 Room: [facility] ENDO ROOM 4 Gender: Female Note Status: Finalized Instrument Name: Jasper Riling [id number] Procedure:             Colonoscopy Indications:           Surveillance: Personal history of adenomatous polyps                         on last colonoscopy > 3 years ago, Last colonoscopy:                         September 2019 Providers:             Lin Landsman MD, MD Referring MD:          No PCP Medicines:             General Anesthesia Complications:         No immediate complications. Estimated blood loss: None. Procedure:             Pre-Anesthesia Assessment:                        - Prior to the procedure, a History and Physical was                         performed, and patient medications and allergies were                         reviewed. The patient is competent. The risks and                         benefits of the procedure and the sedation options and                         risks were discussed with the patient. All questions                         were answered and informed consent was obtained.                         Patient identification and proposed procedure were                         verified by the physician, the nurse, the                         anesthesiologist, the anesthetist and the technician                         in the pre-procedure area in the procedure room in the                         endoscopy suite. Mental Status Examination: alert and                         oriented. Airway Examination: normal oropharyngeal  airway and neck mobility. Respiratory Examination:                         clear to auscultation. CV Examination: normal.                         Prophylactic Antibiotics: The patient does not require                          prophylactic antibiotics. Prior Anticoagulants: The                         patient has taken no previous anticoagulant or                         antiplatelet agents. ASA Grade Assessment: II - A                         patient with mild systemic disease. After reviewing                         the risks and benefits, the patient was deemed in                         satisfactory condition to undergo the procedure. The                         anesthesia plan was to use general anesthesia.                         Immediately prior to administration of medications,                         the patient was re-assessed for adequacy to receive                         sedatives. The heart rate, respiratory rate, oxygen                         saturations, blood pressure, adequacy of pulmonary                         ventilation, and response to care were monitored                         throughout the procedure. The physical status of the                         patient was re-assessed after the procedure.                        After obtaining informed consent, the colonoscope was                         passed under direct vision. Throughout the procedure,                         the patient's blood pressure, pulse, and oxygen  saturations were monitored continuously. The                         Colonoscope was introduced through the anus and                         advanced to the the terminal ileum, with                         identification of the appendiceal orifice and IC                         valve. The colonoscopy was performed without                         difficulty. The patient tolerated the procedure well.                         The quality of the bowel preparation was evaluated                         using the BBPS [facility] Bowel Preparation Scale) with                         scores of: Right Colon = 3, Transverse Colon = 3 and                          Left Colon = 3 (entire mucosa seen well with no                         residual staining, small fragments of stool or opaque                         liquid). The total BBPS score equals 9. Findings:      The perianal and digital rectal examinations were normal. Pertinent       negatives include normal sphincter tone and no palpable rectal lesions.      The terminal ileum appeared normal.      Multiple small and large-mouthed diverticula were found in the       recto-sigmoid colon and sigmoid colon.      The retroflexed view of the distal rectum and anal verge was normal and       showed no anal or rectal abnormalities.      The entire examined colon appeared normal. Impression:            - The examined portion of the ileum was normal.                        - Diverticulosis in the recto-sigmoid colon and in the                         sigmoid colon.                        - The distal rectum and anal verge are normal on                         retroflexion  view.                        - The entire examined colon is normal.                        - No specimens collected. Recommendation:        - Discharge patient to home (with escort).                        - Resume previous diet today.                        - Continue present medications.                        - Repeat colonoscopy in 10 years for screening                         purposes. Procedure Code(s):     --- Professional ---                        K9326, Colorectal cancer screening; colonoscopy on                         individual at high risk Diagnosis Code(s):     --- Professional ---                        Z86.010, Personal history of colonic polyps                        K57.30, Diverticulosis of large intestine without                         perforation or abscess without bleeding CPT copyright 2019 American Medical Association. All rights reserved. The codes documented in this report are  preliminary and upon coder review may  be revised to meet current compliance requirements. Dr. Ulyess Mort Lin Landsman MD, MD 02/28/2022 12:09:38 PM This report has been signed electronically. Number of Addenda: 0 Note Initiated On: 02/28/2022 11:36 AM Scope Withdrawal Time: 0 hours 9 minutes 38 seconds  Total Procedure Duration: 0 hours 12 minutes 59 seconds  Estimated Blood Loss:  Estimated blood loss: none.      [facility]

## 2022-02-28 NOTE — Anesthesia Preprocedure Evaluation (Signed)
Anesthesia Evaluation  Patient identified by MRN, date of birth, ID band Patient awake    Reviewed: Allergy & Precautions, H&P , NPO status , Patient's Chart, lab work & pertinent test results  History of Anesthesia Complications Negative for: history of anesthetic complications  Airway Mallampati: II  TM Distance: >3 FB Neck ROM: full    Dental  (+) Chipped, Dental Advidsory Given, Teeth Intact   Pulmonary neg pulmonary ROS, neg shortness of breath, neg recent URI,           Cardiovascular Exercise Tolerance: Good (-) angina(-) Past MI and (-) DOE negative cardio ROS       Neuro/Psych negative neurological ROS  negative psych ROS   GI/Hepatic Neg liver ROS, GERD  Medicated and Controlled,  Endo/Other  negative endocrine ROS  Renal/GU negative Renal ROS  negative genitourinary   Musculoskeletal   Abdominal   Peds  Hematology negative hematology ROS (+)   Anesthesia Other Findings Past Medical History: 2010: Breast cancer ([place])     Comment:  Right breast cancer - radiation No date: GERD (gastroesophageal reflux disease) No date: Glaucoma No date: Personal history of radiation therapy No date: Urinary frequency No date: Vaginal discharge  Past Surgical History: 2010: BREAST BIOPSY; Left     Comment:  stereotactic 2010: BREAST EXCISIONAL BIOPSY; Right     Comment:  + 2010: BREAST LUMPECTOMY; Right No date: BREAST LUMPECTOMY No date: CESAREAN SECTION 2009: VAGINAL HYSTERECTOMY     Comment:  lsh  BMI    Body Mass Index:  28.15 kg/m      Reproductive/Obstetrics negative OB ROS                             Anesthesia Physical  Anesthesia Plan  ASA: 2  Anesthesia Plan: General   Post-op Pain Management:    Induction: Intravenous  PONV Risk Score and Plan: Propofol infusion and TIVA  Airway Management Planned: Natural Airway and Nasal Cannula  Additional Equipment:    Intra-op Plan:   Post-operative Plan:   Informed Consent: I have reviewed the patients History and Physical, chart, labs and discussed the procedure including the risks, benefits and alternatives for the proposed anesthesia with the patient or authorized representative who has indicated his/her understanding and acceptance.     Dental Advisory Given  Plan Discussed with: Anesthesiologist, CRNA and Surgeon  Anesthesia Plan Comments: (Patient consented for risks of anesthesia including but not limited to:  - adverse reactions to medications - risk of intubation if required - damage to teeth, lips or other oral mucosa - sore throat or hoarseness - Damage to heart, brain, lungs or loss of life  Patient voiced understanding.)        Anesthesia Quick Evaluation

## 2022-03-01 ENCOUNTER — Encounter: Payer: Self-pay | Admitting: Gastroenterology

## 2022-03-06 NOTE — Addendum Note (Signed)
Addendum  created 03/06/22 0205 by Martha Clan, MD   Clinical Note Signed

## 2022-03-06 NOTE — Anesthesia Postprocedure Evaluation (Addendum)
Anesthesia Post Note  Patient: Victoria Greene  Procedure(s) Performed: COLONOSCOPY WITH PROPOFOL  Patient location during evaluation: Endoscopy Anesthesia Type: General Level of consciousness: awake and alert Pain management: pain level controlled Vital Signs Assessment: post-procedure vital signs reviewed and stable Respiratory status: spontaneous breathing, nonlabored ventilation, respiratory function stable and patient connected to nasal cannula oxygen Cardiovascular status: blood pressure returned to baseline and stable Postop Assessment: no apparent nausea or vomiting Anesthetic complications: no   No notable events documented.   Last Vitals:  Vitals:   02/28/22 1217 02/28/22 1227  BP: 100/63   Pulse: 95 61  Resp: 20 18  Temp:    SpO2: 100% 96%    Last Pain:  Vitals:   02/28/22 1227  TempSrc:   PainSc: 0-No pain                 Martha Clan

## 2022-03-07 ENCOUNTER — Ambulatory Visit
Admission: RE | Admit: 2022-03-07 | Discharge: 2022-03-07 | Disposition: A | Discharge status: Home / Self Care | Payer: 59 | Source: Ambulatory Visit | Attending: Obstetrics and Gynecology | Admitting: Obstetrics and Gynecology

## 2022-03-07 ENCOUNTER — Other Ambulatory Visit: Payer: Self-pay | Admitting: Obstetrics and Gynecology

## 2022-03-07 DIAGNOSIS — R928 Other abnormal and inconclusive findings on diagnostic imaging of breast: Secondary | ICD-10-CM

## 2022-03-07 DIAGNOSIS — R921 Mammographic calcification found on diagnostic imaging of breast: Secondary | ICD-10-CM

## 2022-03-09 ENCOUNTER — Encounter: Payer: Self-pay | Admitting: Obstetrics and Gynecology

## 2022-03-14 ENCOUNTER — Ambulatory Visit: Admit: 2022-03-14 | Discharge: 2022-03-15 | Payer: PRIVATE HEALTH INSURANCE

## 2022-03-14 DIAGNOSIS — H35351 Cystoid macular degeneration, right eye: Principal | ICD-10-CM

## 2022-03-14 DIAGNOSIS — H3091 Unspecified chorioretinal inflammation, right eye: Principal | ICD-10-CM

## 2022-03-28 ENCOUNTER — Ambulatory Visit
Admission: RE | Admit: 2022-03-28 | Discharge: 2022-03-28 | Disposition: A | Discharge status: Home / Self Care | Payer: 59 | Source: Ambulatory Visit | Attending: Obstetrics and Gynecology | Admitting: Obstetrics and Gynecology

## 2022-03-28 DIAGNOSIS — N6021 Fibroadenosis of right breast: Secondary | ICD-10-CM | POA: Insufficient documentation

## 2022-03-28 DIAGNOSIS — R928 Other abnormal and inconclusive findings on diagnostic imaging of breast: Secondary | ICD-10-CM

## 2022-03-28 DIAGNOSIS — R921 Mammographic calcification found on diagnostic imaging of breast: Secondary | ICD-10-CM | POA: Insufficient documentation

## 2022-03-28 HISTORY — PX: BREAST BIOPSY: SHX20

## 2022-03-29 LAB — SURGICAL PATHOLOGY

## 2022-05-16 ENCOUNTER — Ambulatory Visit: Admit: 2022-05-16 | Discharge: 2022-05-17 | Payer: PRIVATE HEALTH INSURANCE

## 2022-05-16 DIAGNOSIS — H35351 Cystoid macular degeneration, right eye: Principal | ICD-10-CM

## 2022-05-16 DIAGNOSIS — H43813 Vitreous degeneration, bilateral: Principal | ICD-10-CM

## 2022-05-16 DIAGNOSIS — H3091 Unspecified chorioretinal inflammation, right eye: Principal | ICD-10-CM

## 2022-05-16 DIAGNOSIS — H4061X0 Glaucoma secondary to drugs, right eye, stage unspecified: Principal | ICD-10-CM

## 2022-05-16 DIAGNOSIS — T380X5A Adverse effect of glucocorticoids and synthetic analogues, initial encounter: Principal | ICD-10-CM

## 2022-07-16 ENCOUNTER — Ambulatory Visit: Admit: 2022-07-16 | Discharge: 2022-07-17 | Payer: PRIVATE HEALTH INSURANCE

## 2022-07-16 DIAGNOSIS — Z961 Presence of intraocular lens: Principal | ICD-10-CM

## 2022-07-16 DIAGNOSIS — H35351 Cystoid macular degeneration, right eye: Principal | ICD-10-CM

## 2022-07-16 DIAGNOSIS — H30891 Other chorioretinal inflammations, right eye: Principal | ICD-10-CM

## 2022-07-16 DIAGNOSIS — T380X5A Adverse effect of glucocorticoids and synthetic analogues, initial encounter: Principal | ICD-10-CM

## 2022-07-16 DIAGNOSIS — H4061X0 Glaucoma secondary to drugs, right eye, stage unspecified: Principal | ICD-10-CM

## 2022-07-16 DIAGNOSIS — H43813 Vitreous degeneration, bilateral: Principal | ICD-10-CM

## 2022-07-16 DIAGNOSIS — H3091 Unspecified chorioretinal inflammation, right eye: Principal | ICD-10-CM

## 2022-07-16 MED ORDER — PREDNISOLONE ACETATE 1 % EYE DROPS,SUSPENSION
Freq: Four times a day (QID) | OPHTHALMIC | 0 refills | 90 days | Status: CP
Start: 2022-07-16 — End: 2022-10-14

## 2022-08-23 ENCOUNTER — Ambulatory Visit: Admit: 2022-08-23 | Discharge: 2022-08-24 | Payer: PRIVATE HEALTH INSURANCE

## 2022-08-23 DIAGNOSIS — H3091 Unspecified chorioretinal inflammation, right eye: Principal | ICD-10-CM

## 2022-08-23 DIAGNOSIS — H4061X0 Glaucoma secondary to drugs, right eye, stage unspecified: Principal | ICD-10-CM

## 2022-08-23 DIAGNOSIS — H43813 Vitreous degeneration, bilateral: Principal | ICD-10-CM

## 2022-08-23 DIAGNOSIS — H35351 Cystoid macular degeneration, right eye: Principal | ICD-10-CM

## 2022-08-23 DIAGNOSIS — H30891 Other chorioretinal inflammations, right eye: Principal | ICD-10-CM

## 2022-08-23 DIAGNOSIS — T380X5A Adverse effect of glucocorticoids and synthetic analogues, initial encounter: Principal | ICD-10-CM

## 2022-08-23 MED ORDER — MURO 128 2 % EYE DROPS
OPHTHALMIC | 0 refills | 30 days | Status: CP
Start: 2022-08-23 — End: 2022-09-22

## 2022-08-28 ENCOUNTER — Ambulatory Visit
Admit: 2022-08-28 | Discharge: 2022-08-29 | Payer: PRIVATE HEALTH INSURANCE | Attending: Student in an Organized Health Care Education/Training Program | Primary: Student in an Organized Health Care Education/Training Program

## 2022-08-28 DIAGNOSIS — H18231 Secondary corneal edema, right eye: Principal | ICD-10-CM

## 2022-09-04 DIAGNOSIS — H18231 Secondary corneal edema, right eye: Principal | ICD-10-CM

## 2022-09-04 NOTE — Progress Notes (Unsigned)
ANNUAL PREVENTATIVE CARE GYNECOLOGY  ENCOUNTER NOTE  Subjective:       Victoria Greene is a 57 y.o. G69P2001 female here for a routine annual gynecologic exam. She has a past history significant for breast cancer, status post lumpectomy and radiation therapy.  She also has had negative BRCA gene testing.  The patient is sexually active. The patient has never taken hormone replacement therapy. Patient denies post-menopausal vaginal bleeding. Vandella does wears seatbelts: yes. Does the patient participates in regular exercise? yes  Has the patient ever been transfused or tattooed?: no. The patient reports that there is not domestic violence in her life.   Current complaints: 1.  She has no new concerns today.   Does report that she plans to have eye surgery soon, left corneal transplant. Also has appt to see Rheumatologist soon for evaluation for arthritis.  2. Reports that her hot flushes and night sweats have spontaneously resolved. Has been symptom-free for the past 6-8 months.    Gynecologic History No LMP recorded. Patient has had a hysterectomy. Contraception: status post hysterectomy (Supracervical) Last Pap: 08/04/2021. Results were: normal Last mammogram: 03/28/2022. Results were: abnormal, BIRADS 4.  Has undergone clip placement, recommend repeat in 6 months.  Last Colonoscopy: 02/28/2022. 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    Family History  Problem Relation Age  of Onset   Breast cancer Maternal Aunt 38   Breast cancer Maternal Aunt 46   Heart disease Father    Thyroid disease Mother    Pancreatic cancer Mother    Ovarian cancer Neg Hx    Colon cancer Neg Hx    Diabetes Neg Hx     Past Surgical History:  Procedure Laterality Date   BREAST BIOPSY Left 08/09/2020   stereo bx of calcs, x marker, path pending   BREAST BIOPSY Right 03/28/2022   stereo biopsy/ ribbon clip/ path pending   BREAST EXCISIONAL BIOPSY Left 2010   benign   BREAST EXCISIONAL BIOPSY Right 2010   DCIS   BREAST LUMPECTOMY Right 2010   DCIS   BREAST LUMPECTOMY     CESAREAN SECTION     COLONOSCOPY WITH PROPOFOL N/A 05/26/2018   Procedure: COLONOSCOPY WITH PROPOFOL;  Surgeon: Lin Landsman, MD;  Location: [facility] ENDOSCOPY;  Service: Gastroenterology;  Laterality: N/A;   COLONOSCOPY WITH PROPOFOL N/A 02/28/2022   Procedure: COLONOSCOPY WITH PROPOFOL;  Surgeon: Lin Landsman, MD;  Location: [facility] ENDOSCOPY;  Service: Gastroenterology;  Laterality: N/A;   ESOPHAGOGASTRODUODENOSCOPY (EGD) WITH PROPOFOL N/A 05/26/2018   Procedure: ESOPHAGOGASTRODUODENOSCOPY (EGD) WITH PROPOFOL;  Surgeon: Lin Landsman, MD;  Location: [facility] ENDOSCOPY;  Service: Gastroenterology;  Laterality: N/A;   EYE SURGERY  20013   GLAUCOMA TUBE PLACED  LAPAROSCOPIC BILATERAL SALPINGO OOPHERECTOMY Bilateral 06/23/2018   Procedure: LAPAROSCOPIC BILATERAL SALPINGO OOPHORECTOMY;  Surgeon: Brayton Mars, MD;  Location: [facility] ORS;  Service: Gynecology;  Laterality: Bilateral;   VAGINAL HYSTERECTOMY  2009   lsh    Social History   Socioeconomic History   Marital status: Divorced    Spouse name: Not on file   Number of children: Not on file   Years of education: Not on file   Highest education level: Not on file  Occupational History   Not on file  Tobacco Use   Smoking status: Never   Smokeless tobacco: Never  Vaping Use   Vaping Use: Never used  Substance and Sexual Activity    Alcohol use: Not Currently    Comment: occas   Drug use: No   Sexual activity: Yes    Birth control/protection: None  Other Topics Concern   Not on file  Social History Narrative   Not on file   Social Determinants of Health   Financial Resource Strain: Not on file  Food Insecurity: Not on file  Transportation Needs: Not on file  Physical Activity: Sufficiently Active (04/24/2018)   Exercise Vital Sign    Days of Exercise per Week: 3 days    Minutes of Exercise per Session: 60 min  Stress: Not on file  Social Connections: Not on file  Intimate Partner Violence: Not on file    Current Outpatient Medications on File Prior to Visit  Medication Sig Dispense Refill   acetaminophen (TYLENOL) 500 MG tablet Take 1,000 mg by mouth every 6 (six) hours as needed for moderate pain or headache.     estradiol (ESTRACE) 0.1 MG/GM vaginal cream Apply 1/2- 1 gram vaginally three times a week 42.5 g 6   FIBER ADULT GUMMIES PO Take 3 each by mouth daily.     fluticasone (FLONASE) 50 MCG/ACT nasal spray Place 2 sprays into both nostrils at bedtime as needed for allergies.      gabapentin (NEURONTIN) 100 MG capsule Take 1 capsule (100 mg total) by mouth 3 (three) times daily. 90 capsule 3   loratadine (CLARITIN) 10 MG tablet Take 10 mg by mouth daily.     methylPREDNISolone (MEDROL DOSEPAK) 4 MG TBPK tablet 6 day dose pack - take as directed 21 tablet 0   Multiple Vitamin (MULTIVITAMIN) capsule Take 1 capsule by mouth daily.     Plecanatide (TRULANCE) 3 MG TABS Take 3 mg by mouth daily. 90 tablet 3   No current facility-administered medications on file prior to visit.    Allergies  Allergen Reactions   Nickel Rash   Shellfish Allergy Rash   Shellfish-Derived Products Rash      Review of Systems ROS Review of Systems - General ROS: negative for - chills, fatigue, fever, hot flashes, night sweats, weight gain or weight loss Psychological ROS: negative for - anxiety, decreased libido,  depression, mood swings, physical abuse or sexual abuse Ophthalmic ROS: negative for - blurry vision, eye pain or loss of vision ENT ROS: negative for - headaches, hearing change, visual changes or vocal changes Allergy and Immunology ROS: negative for - hives, itchy/watery eyes or seasonal allergies Hematological and Lymphatic ROS: negative for - bleeding problems, bruising, swollen lymph nodes or weight loss Endocrine ROS: negative for - galactorrhea, hair pattern changes, hot flashes, malaise/lethargy, mood swings, palpitations, polydipsia/polyuria, skin changes, temperature intolerance or unexpected weight changes Breast ROS: negative for - new or changing breast lumps or nipple discharge Respiratory ROS: negative for -  cough or shortness of breath Cardiovascular ROS: negative for - chest pain, irregular heartbeat, palpitations or shortness of breath Gastrointestinal ROS: no abdominal pain, change in bowel habits, or black or bloody stools Genito-Urinary ROS: no dysuria, trouble voiding, or hematuria Musculoskeletal ROS: negative for - joint pain or joint stiffness Neurological ROS: negative for - bowel and bladder control changes Dermatological ROS: negative for rash and skin lesion changes   Objective:   BP 125/86   Pulse 68   Resp 16   Ht 5' 1" (1.549 m)   Wt 149 lb 14.4 oz (68 kg)   BMI 28.32 kg/m  CONSTITUTIONAL: Well-developed, well-nourished female in no acute distress.  PSYCHIATRIC: Normal mood and affect. Normal behavior. Normal judgment and thought content. Lucien: Alert and oriented to person, place, and time. Normal muscle tone coordination. No cranial nerve deficit noted. HENT:  Normocephalic, atraumatic, External right and left ear normal. Oropharynx is clear and moist EYES: Conjunctivae and EOM are normal. Pupils are equal, round, and reactive to light. No scleral icterus.  NECK: Normal range of motion, supple, no masses.  Normal thyroid.  SKIN: Skin is warm and  dry. No rash noted. Not diaphoretic. No erythema. No pallor. CARDIOVASCULAR: Normal heart rate noted, regular rhythm, no murmur. RESPIRATORY: Clear to auscultation bilaterally. Effort and breath sounds normal, no problems with respiration noted. BREASTS: Symmetric in size. No masses, skin changes, nipple drainage, or lymphadenopathy. ABDOMEN: Soft, normal bowel sounds, no distention noted.  No tenderness, rebound or guarding.  BLADDER: Normal PELVIC:  Bladder no bladder distension noted  Urethra: normal appearing urethra with no masses, tenderness or lesions  Vulva: normal appearing vulva with no masses, tenderness or lesions  Vagina: normal appearing vagina with normal color and discharge, no lesions  Cervix: normal appearing cervix without discharge or lesions  Uterus: surgically absent  Adnexa: normal adnexa in size, nontender and no masses  RV: External Exam NormaI, No Rectal Masses, and Normal Sphincter tone  MUSCULOSKELETAL: Normal range of motion. No tenderness.  No cyanosis, clubbing, or edema.  2+ distal pulses. LYMPHATIC: No Axillary, Supraclavicular, or Inguinal Adenopathy.   Labs: Lab Results  Component Value Date   WBC 5.4 02/23/2022   HGB 13.4 02/23/2022   HCT 42.7 02/23/2022   MCV 88.6 02/23/2022   PLT 142 (L) 02/23/2022    Lab Results  Component Value Date   CREATININE 0.76 02/23/2022   BUN 14 02/23/2022   NA 142 02/23/2022   K 3.5 02/23/2022   CL 107 02/23/2022   CO2 29 02/23/2022    Lab Results  Component Value Date   ALT 20 02/23/2022   AST 20 02/23/2022   ALKPHOS 81 02/23/2022   BILITOT 1.3 (H) 02/23/2022    Lab Results  Component Value Date   CHOL 189 04/29/2019   HDL 59 04/29/2019   LDLCALC 110 (H) 04/29/2019   TRIG 98 04/29/2019   CHOLHDL 3.2 04/29/2019    Lab Results  Component Value Date   TSH 1.050 04/29/2019    Lab Results  Component Value Date   HGBA1C 5.3 04/29/2019     Assessment:   1. Well woman exam with routine  gynecological exam   2. Breast cancer screening by mammogram   3. Overweight (BMI 25.0-29.9)   4. History of breast cancer in female   5. Screening cholesterol level   6. Screening for diabetes mellitus (DM)      Plan:  Pap:  Up to date Mammogram:  Up to date, for repeat  this month, ordered.  Colon Screening:   up to date Labs:  See orders Routine preventative health maintenance measures emphasized: Exercise/Diet/Weight control and Stress Management COVID Vaccination status: eligible for booster Advised on shingles vaccine. Prescription given.  Flu vaccine: declined.   Return to [place], MD [place]

## 2022-09-05 ENCOUNTER — Ambulatory Visit (INDEPENDENT_AMBULATORY_CARE_PROVIDER_SITE_OTHER): Payer: 59 | Admitting: Obstetrics and Gynecology

## 2022-09-05 ENCOUNTER — Encounter: Payer: Self-pay | Admitting: Obstetrics and Gynecology

## 2022-09-05 ENCOUNTER — Ambulatory Visit: Admit: 2022-09-05 | Discharge: 2022-09-06 | Payer: PRIVATE HEALTH INSURANCE

## 2022-09-05 VITALS — BP 125/86 | HR 68 | Resp 16 | Ht 61.0 in | Wt 149.9 lb

## 2022-09-05 DIAGNOSIS — Z13220 Encounter for screening for lipoid disorders: Secondary | ICD-10-CM

## 2022-09-05 DIAGNOSIS — Z853 Personal history of malignant neoplasm of breast: Secondary | ICD-10-CM

## 2022-09-05 DIAGNOSIS — Z01419 Encounter for gynecological examination (general) (routine) without abnormal findings: Secondary | ICD-10-CM | POA: Diagnosis not present

## 2022-09-05 DIAGNOSIS — Z1231 Encounter for screening mammogram for malignant neoplasm of breast: Secondary | ICD-10-CM

## 2022-09-05 DIAGNOSIS — E663 Overweight: Secondary | ICD-10-CM

## 2022-09-05 DIAGNOSIS — R350 Frequency of micturition: Secondary | ICD-10-CM | POA: Insufficient documentation

## 2022-09-05 DIAGNOSIS — Z131 Encounter for screening for diabetes mellitus: Secondary | ICD-10-CM

## 2022-09-05 DIAGNOSIS — H18231 Secondary corneal edema, right eye: Principal | ICD-10-CM

## 2022-09-05 DIAGNOSIS — H3091 Unspecified chorioretinal inflammation, right eye: Principal | ICD-10-CM

## 2022-09-05 DIAGNOSIS — H35351 Cystoid macular degeneration, right eye: Principal | ICD-10-CM

## 2022-09-05 DIAGNOSIS — H30891 Other chorioretinal inflammations, right eye: Principal | ICD-10-CM

## 2022-09-05 MED ORDER — SHINGRIX 50 MCG/0.5ML IM SUSR: 0.5000 mL | Freq: Once | INTRAMUSCULAR | 0 refills | Status: AC

## 2022-09-06 LAB — LIPID PANEL
Chol/HDL Ratio: 3.6 ratio (ref 0.0–4.4)
Cholesterol, Total: 236 mg/dL — ABNORMAL HIGH (ref 100–199)
HDL: 66 mg/dL (ref >39)
LDL Chol Calc (NIH): 154 mg/dL — ABNORMAL HIGH (ref 0–99)
Triglycerides: 90 mg/dL (ref 0–149)
VLDL Cholesterol Cal: 16 mg/dL (ref 5–40)

## 2022-09-06 LAB — HEMOGLOBIN A1C
Est. average glucose Bld gHb Est-mCnc: 114 mg/dL
Hgb A1c MFr Bld: 5.6 % (ref 4.8–5.6)

## 2022-09-06 LAB — TSH: TSH: 0.855 u[IU]/mL (ref 0.450–4.500)

## 2022-09-06 LAB — VITAMIN D 25 HYDROXY (VIT D DEFICIENCY, FRACTURES): Vit D, 25-Hydroxy: 22.6 ng/mL — ABNORMAL LOW (ref 30.0–100.0)

## 2022-09-07 ENCOUNTER — Other Ambulatory Visit: Payer: Self-pay | Admitting: Obstetrics and Gynecology

## 2022-09-07 MED ORDER — VITAMIN D (ERGOCALCIFEROL) 1.25 MG (50000 UNIT) PO CAPS: 50000.0000 [IU] | ORAL_CAPSULE | ORAL | 0 refills | Status: DC

## 2022-09-11 ENCOUNTER — Ambulatory Visit
Admission: RE | Admit: 2022-09-11 | Discharge: 2022-09-11 | Disposition: A | Discharge status: Home / Self Care | Payer: 59 | Source: Ambulatory Visit | Attending: Obstetrics and Gynecology | Admitting: Obstetrics and Gynecology

## 2022-09-11 DIAGNOSIS — Z01419 Encounter for gynecological examination (general) (routine) without abnormal findings: Secondary | ICD-10-CM | POA: Diagnosis not present

## 2022-09-11 DIAGNOSIS — Z1231 Encounter for screening mammogram for malignant neoplasm of breast: Secondary | ICD-10-CM | POA: Diagnosis present

## 2022-09-11 DIAGNOSIS — Z853 Personal history of malignant neoplasm of breast: Secondary | ICD-10-CM | POA: Insufficient documentation

## 2022-09-15 ENCOUNTER — Ambulatory Visit
Admit: 2022-09-15 | Discharge: 2022-09-16 | Disposition: A | Discharge status: Home / Self Care | Payer: PRIVATE HEALTH INSURANCE | Attending: Emergency Medicine

## 2022-09-15 DIAGNOSIS — M545 Chronic midline low back pain without sciatica: Principal | ICD-10-CM

## 2022-09-15 DIAGNOSIS — G8929 Other chronic pain: Principal | ICD-10-CM

## 2022-09-15 DIAGNOSIS — G5621 Lesion of ulnar nerve, right upper limb: Principal | ICD-10-CM

## 2022-09-15 MED ORDER — GABAPENTIN 300 MG CAPSULE
ORAL_CAPSULE | Freq: Three times a day (TID) | ORAL | 0 refills | 30 days | Status: CP
Start: 2022-09-15 — End: 2022-10-15

## 2022-09-15 MED ORDER — METHOCARBAMOL 500 MG TABLET
ORAL_TABLET | Freq: Two times a day (BID) | ORAL | 0 refills | 10 days | Status: CP
Start: 2022-09-15 — End: 2022-09-25

## 2022-09-21 MED ORDER — PREDNISONE 5 MG TABLET
ORAL_TABLET | Freq: Every day | ORAL | 0 refills | 14 days | Status: CP
Start: 2022-09-21 — End: 2022-10-05

## 2022-09-21 MED ORDER — PREDNISONE 20 MG TABLET
ORAL_TABLET | Freq: Every day | ORAL | 0 refills | 14 days | Status: CP
Start: 2022-09-21 — End: 2022-09-21

## 2022-09-27 ENCOUNTER — Ambulatory Visit: Admit: 2022-09-27 | Discharge: 2022-09-27 | Payer: PRIVATE HEALTH INSURANCE

## 2022-09-27 ENCOUNTER — Encounter
Admit: 2022-09-27 | Discharge: 2022-09-27 | Payer: PRIVATE HEALTH INSURANCE | Attending: Student in an Organized Health Care Education/Training Program | Primary: Student in an Organized Health Care Education/Training Program

## 2022-09-27 MED ORDER — PREDNISOLONE ACETATE 1 % EYE DROPS,SUSPENSION
Freq: Four times a day (QID) | OPHTHALMIC | 0 refills | 50 days | Status: CP
Start: 2022-09-27 — End: ?
  Filled 2022-09-27: qty 5, 25d supply, fill #0

## 2022-09-27 MED ORDER — MOXIFLOXACIN 0.5 % EYE DROPS
Freq: Four times a day (QID) | OPHTHALMIC | 0 refills | 15 days | Status: CP
Start: 2022-09-27 — End: 2022-10-12
  Filled 2022-09-27: qty 3, 15d supply, fill #0

## 2022-09-28 ENCOUNTER — Ambulatory Visit
Admit: 2022-09-28 | Discharge: 2022-09-29 | Payer: PRIVATE HEALTH INSURANCE | Attending: Student in an Organized Health Care Education/Training Program | Primary: Student in an Organized Health Care Education/Training Program

## 2022-10-01 ENCOUNTER — Ambulatory Visit
Admit: 2022-10-01 | Discharge: 2022-10-02 | Payer: PRIVATE HEALTH INSURANCE | Attending: Student in an Organized Health Care Education/Training Program | Primary: Student in an Organized Health Care Education/Training Program

## 2022-10-01 DIAGNOSIS — Z947 Corneal transplant status: Principal | ICD-10-CM

## 2022-10-05 ENCOUNTER — Ambulatory Visit
Admit: 2022-10-05 | Discharge: 2022-10-06 | Payer: PRIVATE HEALTH INSURANCE | Attending: Student in an Organized Health Care Education/Training Program | Primary: Student in an Organized Health Care Education/Training Program

## 2022-10-05 DIAGNOSIS — Z947 Corneal transplant status: Principal | ICD-10-CM

## 2022-10-08 ENCOUNTER — Ambulatory Visit
Admit: 2022-10-08 | Discharge: 2022-10-09 | Payer: PRIVATE HEALTH INSURANCE | Attending: Student in an Organized Health Care Education/Training Program | Primary: Student in an Organized Health Care Education/Training Program

## 2022-10-08 DIAGNOSIS — H18231 Secondary corneal edema, right eye: Principal | ICD-10-CM

## 2022-10-08 DIAGNOSIS — Z947 Corneal transplant status: Principal | ICD-10-CM

## 2022-10-09 ENCOUNTER — Encounter
Admit: 2022-10-09 | Discharge: 2022-10-09 | Payer: PRIVATE HEALTH INSURANCE | Attending: Anesthesiology | Primary: Anesthesiology

## 2022-10-09 ENCOUNTER — Ambulatory Visit: Admit: 2022-10-09 | Discharge: 2022-10-09 | Payer: PRIVATE HEALTH INSURANCE

## 2022-10-10 ENCOUNTER — Ambulatory Visit
Admit: 2022-10-10 | Discharge: 2022-10-11 | Payer: PRIVATE HEALTH INSURANCE | Attending: Student in an Organized Health Care Education/Training Program | Primary: Student in an Organized Health Care Education/Training Program

## 2022-10-10 DIAGNOSIS — Z947 Corneal transplant status: Principal | ICD-10-CM

## 2022-10-17 ENCOUNTER — Ambulatory Visit: Admit: 2022-10-17 | Discharge: 2022-10-18 | Payer: PRIVATE HEALTH INSURANCE

## 2022-10-17 DIAGNOSIS — H30891 Other chorioretinal inflammations, right eye: Principal | ICD-10-CM

## 2022-10-17 DIAGNOSIS — T380X5A Adverse effect of glucocorticoids and synthetic analogues, initial encounter: Principal | ICD-10-CM

## 2022-10-17 DIAGNOSIS — H4061X0 Glaucoma secondary to drugs, right eye, stage unspecified: Principal | ICD-10-CM

## 2022-10-17 DIAGNOSIS — H35351 Cystoid macular degeneration, right eye: Principal | ICD-10-CM

## 2022-10-19 ENCOUNTER — Ambulatory Visit
Admit: 2022-10-19 | Discharge: 2022-10-20 | Payer: PRIVATE HEALTH INSURANCE | Attending: Student in an Organized Health Care Education/Training Program | Primary: Student in an Organized Health Care Education/Training Program

## 2022-10-19 DIAGNOSIS — Z9889 Other specified postprocedural states: Principal | ICD-10-CM

## 2022-10-24 ENCOUNTER — Ambulatory Visit
Admit: 2022-10-24 | Discharge: 2022-10-25 | Payer: PRIVATE HEALTH INSURANCE | Attending: Student in an Organized Health Care Education/Training Program | Primary: Student in an Organized Health Care Education/Training Program

## 2022-10-24 DIAGNOSIS — Z947 Corneal transplant status: Principal | ICD-10-CM

## 2022-11-07 ENCOUNTER — Ambulatory Visit
Admit: 2022-11-07 | Discharge: 2022-11-08 | Payer: PRIVATE HEALTH INSURANCE | Attending: Student in an Organized Health Care Education/Training Program | Primary: Student in an Organized Health Care Education/Training Program

## 2022-11-07 DIAGNOSIS — Z9889 Other specified postprocedural states: Principal | ICD-10-CM

## 2022-11-07 MED ORDER — PREDNISOLONE ACETATE 1 % EYE DROPS,SUSPENSION
Freq: Four times a day (QID) | OPHTHALMIC | 0 refills | 50 days | Status: CP
Start: 2022-11-07 — End: ?

## 2022-11-20 ENCOUNTER — Ambulatory Visit
Admit: 2022-11-20 | Discharge: 2022-11-21 | Payer: PRIVATE HEALTH INSURANCE | Attending: Student in an Organized Health Care Education/Training Program | Primary: Student in an Organized Health Care Education/Training Program

## 2022-11-20 DIAGNOSIS — Z9889 Other specified postprocedural states: Principal | ICD-10-CM

## 2022-11-21 ENCOUNTER — Other Ambulatory Visit: Payer: Self-pay | Admitting: Obstetrics and Gynecology

## 2022-11-30 MED ORDER — PREDNISOLONE ACETATE 1 % EYE DROPS,SUSPENSION
1 refills | 0 days | Status: CP
Start: 2022-11-30 — End: ?

## 2022-12-17 ENCOUNTER — Ambulatory Visit: Admit: 2022-12-17 | Discharge: 2022-12-18 | Payer: PRIVATE HEALTH INSURANCE

## 2022-12-17 DIAGNOSIS — H30891 Other chorioretinal inflammations, right eye: Principal | ICD-10-CM

## 2022-12-17 DIAGNOSIS — H3091 Unspecified chorioretinal inflammation, right eye: Principal | ICD-10-CM

## 2022-12-17 DIAGNOSIS — H35351 Cystoid macular degeneration, right eye: Principal | ICD-10-CM

## 2022-12-17 DIAGNOSIS — Z961 Presence of intraocular lens: Principal | ICD-10-CM

## 2023-02-08 ENCOUNTER — Ambulatory Visit
Admit: 2023-02-08 | Discharge: 2023-02-09 | Payer: PRIVATE HEALTH INSURANCE | Attending: Student in an Organized Health Care Education/Training Program | Primary: Student in an Organized Health Care Education/Training Program

## 2023-02-08 DIAGNOSIS — H4089 Other specified glaucoma: Principal | ICD-10-CM

## 2023-02-08 MED ORDER — PREDNISOLONE ACETATE 1 % EYE DROPS,SUSPENSION
OPHTHALMIC | 1 refills | 34 days | Status: CP
Start: 2023-02-08 — End: ?

## 2023-02-22 ENCOUNTER — Ambulatory Visit
Admit: 2023-02-22 | Discharge: 2023-02-23 | Payer: PRIVATE HEALTH INSURANCE | Attending: Student in an Organized Health Care Education/Training Program | Primary: Student in an Organized Health Care Education/Training Program

## 2023-02-22 DIAGNOSIS — Z947 Corneal transplant status: Principal | ICD-10-CM

## 2023-02-25 ENCOUNTER — Ambulatory Visit
Admit: 2023-02-25 | Discharge: 2023-02-26 | Payer: PRIVATE HEALTH INSURANCE | Attending: Student in an Organized Health Care Education/Training Program | Primary: Student in an Organized Health Care Education/Training Program

## 2023-02-25 DIAGNOSIS — H35351 Cystoid macular degeneration, right eye: Principal | ICD-10-CM

## 2023-02-25 DIAGNOSIS — H30899 Other chorioretinal inflammations, unspecified eye: Principal | ICD-10-CM

## 2023-03-01 ENCOUNTER — Other Ambulatory Visit: Payer: Self-pay | Admitting: Obstetrics and Gynecology

## 2023-03-04 NOTE — Telephone Encounter (Signed)
She would need to revisit to test her levels to receive a higher dose prescription.  Otherwise, she can take over the counter Vitamin D (2,000 units daily) for maintenance.

## 2023-03-05 DIAGNOSIS — H30891 Other chorioretinal inflammations, right eye: Principal | ICD-10-CM

## 2023-03-05 MED ORDER — MYCOPHENOLATE MOFETIL 500 MG TABLET
ORAL_TABLET | ORAL | 0 refills | 49 days | Status: CP
Start: 2023-03-05 — End: 2024-03-03
  Filled 2023-03-08: qty 106, 30d supply, fill #0

## 2023-03-05 NOTE — Unmapped (Signed)
[facility]   Patient Onboarding/Medication Counseling    Brandi [name] is a 57 y.o. 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  I am speaking to the patient.    Was a Nurse, learning disability used for this call? No    Verified patient's date of birth / HIPAA.    Specialty medication(s) to be sent: Inflammatory Disorders: mycophenolate      Non-specialty medications/supplies to be sent: n/a      Medications not needed at this time: n/a         Cellcept (mycopheonlate mofetil)    Medication & Administration     Dosage: Take 1 tablet (500 mg total) by mouth two (2) times a day for 7 days, THEN 2 tablets (1,000 mg total) two (2) times a day. If tolerating 1 tablet twice daily for 1 week, increase to 2 tablets twice daily..    Administration:   Take by mouth with or without food.   Taking with food can minimize GI side effects.   Swallow capsules whole, do not crush or chew.  Oral suspension should be shaken well prior to administration.  Do not mix with other medications and discard any unused portion 60 days after constitution.      Adherence/Missed dose instructions:  Take a missed dose as soon as you remember it . If it is close to the time of your next dose, skip the missed dose and resume your normal schedule.Never take 2 doses to try and catch up from a missed dose.    Goals of Therapy     Reduce immune response    Side Effects & Monitoring Parameters     Feeling tired or weak  Shakiness  Trouble sleeping  Diarrhea, abdominal pain, nausea, vomiting, constipation or decreased appetite  Decreases in blood counts   Back or joint pain  Hypertension or hypotension  High blood sugar  Headache  Skin rash    The following side effects should be reported to the provider:  Reduced immune function - report signs of infection such as fever; chills; body aches; very bad sore throat; ear or sinus pain; cough; more sputum or change in color of sputum; pain with passing urine; wound that will not heal, etc.  Also at a slightly higher risk of some malignancies (mainly skin and blood cancers) due to this reduced immune function.  Allergic reaction (rash, hives, swelling, shortness of breath)  High blood sugar (confusion, feeling sleepy, more thirst, more hungry, passing urine more often, flushing, fast breathing, or breath that smells like fruit)  Electrolyte issues (mood changes, confusion, muscle pain or weakness, a heartbeat that does not feel normal, seizures, not hungry, or very bad upset stomach or throwing up)  High or low blood pressure (bad headache or dizziness, passing out, or change in eyesight)  Kidney issues (unable to pass urine, change in how much urine is passed, blood in the urine, or a big weight gain)  Skin (oozing, heat, swelling, redness, or pain), UTI and other infections   Chest pain or pressure  Abnormal heartbeat  Unexplained bleeding or bruising  Abnormal burning, numbness, or tingling  Muscle cramps,  Yellowing of skin or eyes    Monitoring parameters  Pregnancy   CBC   Renal and hepatic function    Contraindications, Warnings, & Precautions     *This is a REMS drug and an FDA-approved patient medication guide will be printed with each  dispensation  Black Box Warning: Infections   Black Box Warning: Lymphoproliferative disorders - risk of development of lymphoma and skin malignancy is increased  Black Box Warning: Use during pregnancy is associated with increased risks of first trimester pregnancy loss and congenital malformations.   Black Box Warning: Females of reproductive potential should use contraception during treatment and for 6 weeks after therapy is discontinued  Is patient using an effective method of contraception? No  If yes, method of contraception:  patient had a partial hysterectomy   CNS depression  New or reactivated viral infections  Neutropenia  Female patients and/or their female partners should use effective contraception during treatment of the female patient and for at least 3 months after last dose.  Breastfeeding is not recommended during therapy and for 6 weeks after last dose    Drug/Food Interactions     Medication list reviewed in Epic. The patient was instructed to inform the care team before taking any new medications or supplements.  Will separate with omeprazole. She does not take it often  .   Separate doses of antacids and this medication  Check with your doctor before getting any vaccinations    Storage, Handling Precautions, & Disposal     Store at room temperature in a dry place  This medication is considered hazardous. Wash hands after handling and store out of reach or others, including children and pets.      Current Medications (including OTC/herbals), Comorbidities and Allergies     Current Outpatient Medications   Medication Sig Dispense Refill    acetaminophen (TYLENOL) 325 MG tablet Take by mouth.      ergocalciferol-1,250 mcg, 50,000 unit, (DRISDOL) 1,250 mcg (50,000 unit) capsule Take 1 capsule (1,250 mcg total) by mouth once a week.      fluticasone propionate (FLONASE) 50 mcg/actuation nasal spray into each nostril.      gabapentin (NEURONTIN) 300 MG capsule Take 1 capsule (300 mg total) by mouth Three (3) times a day. (Patient taking differently: Take 1 capsule (300 mg total) by mouth once as needed.) 90 capsule 0    loratadine (CLARITIN) 10 mg tablet Take 1 tablet (10 mg total) by mouth daily.      mycophenolate (CELLCEPT) 500 mg tablet Take 1 tablet (500 mg total) by mouth two (2) times a day for 7 days, THEN 2 tablets (1,000 mg total) two (2) times a day. If tolerating 1 tablet twice daily for 1 week, increase to 2 tablets twice daily.. 180 tablet 0    omeprazole (PRILOSEC) 20 MG capsule       prednisoLONE acetate (PRED FORTE) 1 % ophthalmic suspension Administer 1 drop to the right eye every four (4) hours. 10 mL 1    simethicone (GAS-X ORAL) Take 1 tablet by mouth two (2) times a day.       Current Facility-Administered Medications   Medication Dose Route Frequency Provider Last Rate Last Admin    fluocinolone (YUTIQ) intravitreal implant 0.18 mg  0.18 mg Intravitreal Once Britt Bottom, MD         Facility-Administered Medications Ordered in Other Visits   Medication Dose Route Frequency Provider Last Rate Last Admin    [EXPIRED] triamcinolone acetonide (PF) (XIPERE) injection 4 mg  4 mg suprachoroidal  Britt Bottom, MD   4 mg at 10/16/21 6578       Allergies   Allergen Reactions    Nickel Rash    Shellfish Containing Products Rash  Patient Active Problem List   Diagnosis    Chorioretinitis    Retinal vasculitis    Senile nuclear sclerosis    Borderline glaucoma with steroid responders    Vitreous degeneration    CME (cystoid macular edema)    Pseudophakia, right eye    Cataract    Multifocal choroiditis of right eye    Steroid induced glaucoma, right eye    PVD (posterior vitreous detachment), bilateral    Eczema    Corneal edema, secondary, right    History of Descemet's stripping endothelial keratoplasty (DSEK)       Reviewed and up to date in Epic.    Appropriateness of Therapy     Acute infections noted within Epic:  No active infections  Patient reported infection: None    Is medication and dose appropriate based on diagnosis and infection status? Yes    Prescription has been clinically reviewed: Yes      Baseline Quality of Life Assessment      How many days over the past month did your conditoin  keep you from your normal activities? For example, brushing your teeth or getting up in the morning. Patient declined to answer    Financial Information     Medication Assistance provided: None Required    Anticipated copay of $0/30 days  reviewed with patient. Verified delivery address.    Delivery Information     Scheduled delivery date: 7/5    Expected start date: 7/5      Medication will be delivered via Same Day Courier to the prescription address in [facility].  This shipment will not require a signature.      Explained the services we provide at [facility] and that each month we would call to set up refills.  Stressed importance of returning phone calls so that we could ensure they receive their medications in time each month.  Informed patient that we should be setting up refills 7-10 days prior to when they will run out of medication.  A pharmacist will reach out to perform a clinical assessment periodically.  Informed patient that a welcome packet, containing information about our pharmacy and other support services, a Notice of Privacy Practices, and a drug information handout will be sent.      The patient or caregiver noted above participated in the development of this care plan and knows that they can request review of or adjustments to the care plan at any time.      Patient or caregiver verbalized understanding of the above information as well as how to contact the pharmacy at [phone] option 4 with any questions/concerns.  The pharmacy is open Monday through Friday 8:30am-4:30pm.  A pharmacist is available 24/7 via pager to answer any clinical questions they may have.    Patient Specific Needs     Does the patient have any physical, cognitive, or cultural barriers? No    Does the patient have adequate living arrangements? (i.e. the ability to store and take their medication appropriately) Yes    Did you identify any home environmental safety or security hazards? No    Patient prefers to have medications discussed with  Patient     Is the patient or caregiver able to read and understand education materials at a high school level or above? Yes    Patient's primary language is  English     Is the patient high risk? No    SOCIAL DETERMINANTS OF HEALTH  At the [facility], we have learned that life circumstances - like trouble affording food, housing, utilities, or transportation can affect the health of many of our patients.   That is why we wanted to ask: are you currently experiencing any life circumstances that are negatively impacting your health and/or quality of life? Patient declined to answer    Social Determinants of Health     Financial Resource Strain: Not on file   Internet Connectivity: Not on file   Food Insecurity: Not on file   Tobacco Use: Low Risk  (02/25/2023)    Patient History     Smoking Tobacco Use: Never     Smokeless Tobacco Use: Never     Passive Exposure: Not on file   Housing/Utilities: Not on file   Alcohol Use: Not on file   Transportation Needs: Not on file   Substance Use: Not on file   Health Literacy: Not on file   Physical Activity: Sufficiently Active (04/24/2018)    Received from [facility], [facility]    Exercise Vital Sign     Days of Exercise per Week: 3 days     Minutes of Exercise per Session: 60 min   Interpersonal Safety: Not on file   Stress: Not on file   Intimate Partner Violence: Not on file   Depression: Not on file   Social Connections: Not on file       Would you be willing to receive help with any of the needs that you have identified today? Not applicable       Julianne Rice, PharmD  [facility] Specialty Pharmacist

## 2023-03-05 NOTE — Unmapped (Signed)
[facility] Specialty Medication Onboarding    Specialty Medication: Mycophenolate 500mg  tablet  Prior Authorization: Not Required   Financial Assistance: No - copay  <$25  Final Copay/Day Supply: $0 / 30 days    Insurance Restrictions: Yes - max 1 month supply     Notes to Pharmacist: N/A  Credit Card on File: no    The triage team has completed the benefits investigation and has determined that the patient is able to fill this medication at [facility]. Please contact the patient to complete the onboarding or follow up with the prescribing physician as needed.

## 2023-03-15 NOTE — Telephone Encounter (Signed)
Pt stated she would be fine until her next apt with Dr.Cherry.

## 2023-03-15 NOTE — Telephone Encounter (Signed)
Unable to lvm to ask pt which option she would like to do.

## 2023-03-19 DIAGNOSIS — H30891 Other chorioretinal inflammations, right eye: Principal | ICD-10-CM

## 2023-03-19 MED ORDER — MYCOPHENOLATE SODIUM 360 MG TABLET,DELAYED RELEASE
ORAL_TABLET | Freq: Two times a day (BID) | ORAL | 0 refills | 90 days | Status: CP
Start: 2023-03-19 — End: ?
  Filled 2023-03-21: qty 106, 30d supply, fill #0

## 2023-03-20 NOTE — Unmapped (Signed)
[facility]   Patient Onboarding/Medication Counseling    -Switch from Cellcept to mycophenolate due to GI upset with cellcept. Patient will need to start new Myfortic generic on Friday 7/19    Brandi [name] is a 57 y.o. female with choroiditis of right eye who I am counseling today on initiation of therapy.  I am speaking to the patient.    Was a Nurse, learning disability used for this call? No    Verified patient's date of birth / HIPAA.    Specialty medication(s) to be sent: Inflammatory Disorders: mycophenolate      Non-specialty medications/supplies to be sent: n/a      Medications not needed at this time: n/a         Myfortic (mycophenolic acid)    Medication & Administration     Dosage:   Take 1 tablet (360 mg total) by mouth two (2) times a day. If tolerating 1 tablet twice daily for 1 week, increase to 2 tablets twice daily.    Administration:   Take with or without food, although taking with food helps minimize GI side effects.  Swallow the pills whole, do not chew or crush    Adherence/Missed dose instructions:  Take a missed dose as soon as you think about it.  If it is less than 2 hours until your next dose, skip the missed dose and go back to your normal time.  Do not take 2 doses at the same time or extra doses.    Goals of Therapy     To prevent organ rejection    Side Effects & Monitoring Parameters     Common side effects  Back or joint pain  Constipation  Headache/dizziness  Not hungry  Stomach pain, diarrhea, constipation, gas, upset stomach, vomiting, nausea  Feeling tired or weak  Shakiness  Trouble sleeping  Increased risk of infection    The following side effects should be reported to the provider:  Allergic reaction  High blood sugar (confusion, feeling sleepy, more thirst, more hungry, passing urine more often, flushing, fast breathing, or breath that smells like fruit)  Electrolyte issues (mood changes, confusion, muscle pain or weakness, a heartbeat that does not feel normal, seizures, not hungry, or very bad upset stomach or throwing up)  High or low blood pressure (bad headache or dizziness, passing out, or change in eyesight)  Kidney issues (unable to pass urine, change in how much urine is passed, blood in the urine, or a big weight gain)  Skin (oozing, heat, swelling, redness, or pain), UTI and other infections   Chest pain or pressure  Abnormal heartbeat  Unexplained bleeding or bruising  Abnormal burning, numbness, or tingling  Muscle cramps,  Yellowing of skin or eyes    Monitoring parameters  Pregnancy test initially prior to treatment and 8-10 days later then as needed)  CBC weekly for first month then twice monthly for next 2 months, then monthly)  Monitor Renal and liver functions  Signs of organ rejection    Contraindications, Warnings, & Precautions     *This is a REMS drug and an FDA-approved patient medication guide will be printed with each dispensation  Black Box Warning: Infections   Black Box Warning: Lymphoproliferative disorders - risk of development of lymphoma and skin malignancy is increased  Black Box Warning: Use during pregnancy is associated with increased risks of first trimester pregnancy loss and congenital malformations.   Black Box Warning: Females of reproductive potential should use contraception during treatment  and for 6 weeks after therapy is discontinued  Is patient using an effective method of contraception? Not Applicable  If yes, method of contraception:  n/a  CNS depression  New or reactivated viral infections  Neutropenia  Female patients and/or their female partners should use effective contraception during treatment of the female patient and for at least 3 months after last dose.  Breastfeeding is not recommended during therapy and for 6 weeks after last dose    Drug/Food Interactions     Medication list reviewed in Epic. The patient was instructed to inform the care team before taking any new medications or supplements. No drug interactions identified.   Do not take Echinacea while on this medication  Check with your doctor before getting any vaccinations (live or inactivated)    Storage, Handling Precautions, & Disposal     Store at room temperature  Keep away from children and pets  This drug is considered hazardous and should be handled as little as possible.  Wash hands before and after touching pills. If someone else helps with medication administration, they should wear gloves.      Current Medications (including OTC/herbals), Comorbidities and Allergies     Current Outpatient Medications   Medication Sig Dispense Refill    acetaminophen (TYLENOL) 325 MG tablet Take by mouth.      ergocalciferol-1,250 mcg, 50,000 unit, (DRISDOL) 1,250 mcg (50,000 unit) capsule Take 1 capsule (1,250 mcg total) by mouth once a week.      fluticasone propionate (FLONASE) 50 mcg/actuation nasal spray into each nostril.      gabapentin (NEURONTIN) 300 MG capsule Take 1 capsule (300 mg total) by mouth Three (3) times a day. (Patient taking differently: Take 1 capsule (300 mg total) by mouth once as needed.) 90 capsule 0    loratadine (CLARITIN) 10 mg tablet Take 1 tablet (10 mg total) by mouth daily.      mycophenolate (MYFORTIC) 360 MG TbEC Take 1 tablet (360 mg total) by mouth two (2) times a day. If tolerating 1 tablet twice daily for 1 week, increase to 2 tablets twice daily. 180 tablet 0    omeprazole (PRILOSEC) 20 MG capsule       prednisoLONE acetate (PRED FORTE) 1 % ophthalmic suspension Administer 1 drop to the right eye every four (4) hours. 10 mL 1    simethicone (GAS-X ORAL) Take 1 tablet by mouth two (2) times a day.       Current Facility-Administered Medications   Medication Dose Route Frequency Provider Last Rate Last Admin    fluocinolone (YUTIQ) intravitreal implant 0.18 mg  0.18 mg Intravitreal Once Britt Bottom, MD         Facility-Administered Medications Ordered in Other Visits   Medication Dose Route Frequency Provider Last Rate Last Admin [EXPIRED] triamcinolone acetonide (PF) (XIPERE) injection 4 mg  4 mg suprachoroidal  Britt Bottom, MD   4 mg at 10/16/21 1610       Allergies   Allergen Reactions    Nickel Rash    Shellfish Containing Products Rash       Patient Active Problem List   Diagnosis    Chorioretinitis    Retinal vasculitis    Senile nuclear sclerosis    Borderline glaucoma with steroid responders    Vitreous degeneration    CME (cystoid macular edema)    Pseudophakia, right eye    Cataract    Multifocal choroiditis of right eye    Steroid induced glaucoma, right  eye    PVD (posterior vitreous detachment), bilateral    Eczema    Corneal edema, secondary, right    History of Descemet's stripping endothelial keratoplasty (DSEK)       Reviewed and up to date in Epic.    Appropriateness of Therapy     Acute infections noted within Epic:  No active infections  Patient reported infection: None    Is medication and dose appropriate based on diagnosis and infection status? Yes    Prescription has been clinically reviewed: Yes      Baseline Quality of Life Assessment      How many days over the past month did your condition  keep you from your normal activities? For example, brushing your teeth or getting up in the morning. Patient declined to answer    Financial Information     Medication Assistance provided: None Required    Anticipated copay of $0 reviewed with patient. Verified delivery address.    Delivery Information     Scheduled delivery date: 7/18    Expected start date: 7/18      Medication will be delivered via Same Day Courier to the prescription address in [facility].  This shipment will not require a signature.      Explained the services we provide at [facility] and that each month we would call to set up refills.  Stressed importance of returning phone calls so that we could ensure they receive their medications in time each month.  Informed patient that we should be setting up refills 7-10 days prior to when they will run out of medication.  A pharmacist will reach out to perform a clinical assessment periodically.  Informed patient that a welcome packet, containing information about our pharmacy and other support services, a Notice of Privacy Practices, and a drug information handout will be sent.      The patient or caregiver noted above participated in the development of this care plan and knows that they can request review of or adjustments to the care plan at any time.      Patient or caregiver verbalized understanding of the above information as well as how to contact the pharmacy at [phone] option 4 with any questions/concerns.  The pharmacy is open Monday through Friday 8:30am-4:30pm.  A pharmacist is available 24/7 via pager to answer any clinical questions they may have.    Patient Specific Needs     Does the patient have any physical, cognitive, or cultural barriers? No    Does the patient have adequate living arrangements? (i.e. the ability to store and take their medication appropriately) Yes    Did you identify any home environmental safety or security hazards? No    Patient prefers to have medications discussed with  Patient     Is the patient or caregiver able to read and understand education materials at a high school level or above? Yes    Patient's primary language is  English     Is the patient high risk? No    SOCIAL DETERMINANTS OF HEALTH     At the [facility], we have learned that life circumstances - like trouble affording food, housing, utilities, or transportation can affect the health of many of our patients.   That is why we wanted to ask: are you currently experiencing any life circumstances that are negatively impacting your health and/or quality of life? No    Social Determinants of Health  Financial Resource Strain: Not on file   Internet Connectivity: Not on file   Food Insecurity: Not on file   Tobacco Use: Low Risk  (02/25/2023)    Patient History     Smoking Tobacco Use: Never     Smokeless Tobacco Use: Never     Passive Exposure: Not on file   Housing/Utilities: Not on file   Alcohol Use: Not on file   Transportation Needs: Not on file   Substance Use: Not on file   Health Literacy: Not on file   Physical Activity: Sufficiently Active (04/24/2018)    Received from [facility]    Exercise Vital Sign     Days of Exercise per Week: 3 days     Minutes of Exercise per Session: 60 min   Interpersonal Safety: Unknown (03/20/2023)    Interpersonal Safety     Unsafe Where You Currently Live: Not on file     Physically Hurt by Anyone: Not on file     Abused by Anyone: Not on file   Stress: Not on file   Intimate Partner Violence: Not on file   Depression: Not on file   Social Connections: Not on file       Would you be willing to receive help with any of the needs that you have identified today? No       Julianne Rice, PharmD  [facility] Specialty Pharmacist

## 2023-03-20 NOTE — Unmapped (Signed)
Clinical Assessment Needed For: Dose Change  Medication: Mycophenolate 360mg  EC tablet  Last Fill Date/Day Supply: N/A / N/A  Copay $0  Was previous dose already scheduled to fill: Yes    Notes to Pharmacist: N/A

## 2023-03-25 NOTE — Unmapped (Signed)
[facility] - PHARMACIST NOTES    Reached out to follow up with patient who is reporting headaches, dizzines and nausea from current therapies.  Patient started on Myfortic 2 weeks ago. She has a sinus infection and started on Cefdinir 03/14/23 with plan for 10 days but patient stopped after 7 days due to headache, dizziness and nausea.  She also took Mucinex-DM the past week due to sinus infection.  She report worsens dizziness and headaches over the past few days. She stopped Myfortic last night.     Due to sinus infection plus the combination of new medications (Myfortic, Cefdinir, Mucinex-DM), it's hard to differentiate what is causing her symptoms.  Advised patient it is ok to continue holding off Myfortic for now.  I will check in with her early next week.  If she is doing better, patient is open to retrying Myfortic. If symptoms worsen over this time, will need to re-evaluate sinus infection.     Chelsea Aus

## 2023-03-25 NOTE — Unmapped (Signed)
Spoke with pt, forwarded to Dr Dorris Carnes, see other encounter

## 2023-03-25 NOTE — Unmapped (Signed)
Sent Chat Msg to Dr Toy Cookey, pt states the mycophenolate has her head spinning and BP shot up would like to know if this is common and what should she do. pt is on hold

## 2023-03-25 NOTE — Unmapped (Signed)
Pt called, spoke with PAC, returned call.  Pt stated about 1 week ago she started having dizziness that lasts all day, and she fell a couple of times.  While at work her employer sent her to the wellness center, where her BP was 137/81 HR 70. I reassured her that the BP and HR are normal-BP just mildly elevated.  She denies any SOB/CP, she does endorse orthostatic hypotension-reminded her to take it slow when standing. She also stated that she has been very nauseated.  She is having a HA all day unrelieved by Tyelnol, concentrated mostly behind the eyes and her neck is sore as well.  She did say that her eyesight is improving.     MD and clinical Pharm notified

## 2023-04-01 NOTE — Unmapped (Signed)
[facility] CLIN[facility] - PHARMACIST NOTES    Called to check in with patient after conversation on 03/25/23 where she reports headache, dizziness, nausea unknown if from Myfortic or another source. Pt was also having a sinus infection at that point so she held of Myfortic.     Today - she is feeling much better, symptoms above resolved. She is open to retrying Myfortic. Will resume 360mg  twice daily for 1 week then increase to 720mg  twice daily if tolerate. Patient will reach out to me via phone or mychart should she runs into any issue.     Chelsea Aus

## 2023-04-12 ENCOUNTER — Ambulatory Visit: Admit: 2023-04-12 | Discharge: 2023-04-13 | Payer: PRIVATE HEALTH INSURANCE

## 2023-04-12 DIAGNOSIS — H35351 Cystoid macular degeneration, right eye: Principal | ICD-10-CM

## 2023-04-12 NOTE — Unmapped (Signed)
[facility]    Timeline Summary  #Multifocal choroiditis right eye   #Steroid-induced glaucoma right eye  #Cystoid macular edema right eye                 Right    Left    Systemic    Date VA IOP Inf. Plan VA IOP Inf. Plan     07-01-1966          DOB    2004             10/25/2011    IOL, BGI         12/31/2012    IVK         03/27/2013   ERG           05/24/2017 20/Brandi  8   20/20 12       05/29/2017 20/60    IVK 20/20        08/21/2017 20/60  9   20/25  13       09/12/2017 20/Brandi  6  IVK 20/20 12       12/18/2017 20/40 8   20/25 14       01/17/2018 20/Brandi   IVK 20/25 13       02/06/2018    IVK         03/07/2018 20/60  11   20/20  16       05/02/2018 HM 5  IVK 20/20 16       06/06/2018 20/70  10   20/20 12       08/14/2018 20/70  10  IVK 20/25 18       10/30/2018 20/100 9  IVK 20/20 18       01/15/2019 20/Brandi  10  IVK 20/20  17       04/21/2019 20/Brandi  7  OZD 20/20  20       04/28/2019 20/200 10   20/20 16       07/07/2019 20/400 9  OZD 20/20 12       09/09/2019 20/60  6  OZD 20/30  16       11/11/2019 20/Brandi  5  OZD 20/25 15       01/13/2020 20/Brandi 6  OZD 20/25  19       03/22/2020 20/200 7  OZD 20/25 10       05/24/2020 20/200 4  OZD 20/25  20       07/26/2020 20/200  7  OZD 20/20  20      KLW 09/28/2020 20/200 8 sl better cme OZD 8 20/20 16       11/04/2020  20/50 9 cme better 5 20/20 19       12/02/2020 20/60 3 cme worse OZD 9 20/20 16       01/20/2021  20/60  9 cme stable IVK 7   14       03/10/2021  20/60  8  CME improved   20/20  16      KLW 04/19/2021 20/70 5 CME sl worse SCT 12 20/20 12       06/16/2021 20/70 3 CME better  20/25 17       07/17/2021 20/Brandi 5 worse SCT, 12         08/18/2021 20//70 5 better  20/20 15       10/16/2021 20/Brandi 4 worse SCT, 12 16 20        12/25/2021 20/Brandi 4  TC YUT  01/10/2022 20/100 3 SL worse  20/20 3       01/24/2022 20/200 4 Sl worse cIVT 20/20 16       02/21/2022 20/200 4 hazy  20/20 17       03/14/2022 20/150 4 Sl better  20/20 20       05/16/2022 20/150 6 Sl better  20/20 19       07/16/2022 09/05/2022 CF 6 K edema YUT 20/20 17       09/27/2022    DSEK + tube repos         10/09/2022     DSEK Rebub Sutures           12/17/2022 CF 6 K better  20/20 19       03/11/2023         Start MMF    04/12/2023 20/800 5 better  20/20 19   MMF                                 Initial History of present illness:   57 year old Brandi [name], works for [employer], with complex history in the right eye with 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  She has been treated here at [facility] for many years at least since 2006 and has been in the last few years treated with intraocular steroids consistently including Ozurdex every 2 months since August 2020.    She had a Baerveldt glaucoma tube placed in twenty thirteen and is followed by Dr. Cleotis Nipper for glaucoma.    The full-field ERG which was done in 2014 is somewhat difficult to interpret but appears to show both decreased voltage in rod and cone signals    A detailed questionnaire regarding medical history and review of symptoms relevant to uveitis was filled out by the patient, reviewed by the ophthalmologist and scanned into the electronic medical record.       Assessment and plan:     # multifocal choroiditis OD   # macular edema OD   Pigmentary changes in the periphery with an ERG consistent with unilateral RP vs. MFC   12/02/2020 worse at 9-week Ozurdex interval  gravity concentrated IVT lasted about 12 weeks  xipere lasts about 12 weeks  could consider in the future going back to triamcinolone and attempting centrifuge concentration to increase duration of effect, vs continue xipere vs yutiq vs immunosupression  Patient prefers Xipere rather than ozurdex , lower lid swelling  02/21/2022 concentrated triamcinolone with inflammatory reaction      Follow-up today.  Doing well status post Yutiq and DSAEK and immunosuppression with CellCept.  I think immunosuppression is helping and would suggest continuing.  Will continue to follow closely.    Fu 4  months, oct with edi, optos color and faf,    # Corneal edema right eye  S/p DSEK Rebubble + Anchoring Sutures Right Eye (10/09/22)  S/p DSEK + tube repositioning RIGHT EYE (Ranjan/Fleischman/Pascoe)(09/27/22)   - continues PF BID, following with Ranjan/Fleischman     #  Glaucoma, s/p tube OD,   IOP acceptable  Continue follow-up with Dr. Cleotis Nipper.    #  PVD  Stable  Pt instructed to return to clinic ASAP if new floaters, flashes or curtain/shadow occur.    #  pseudophakia right eye.   Stable  Observe    #Cataract left eye  Not visually significant  Observe     # eczema  age 89 to about 48 was active   treated with systemic steroids    INTERPRETATION EXTENDED OPHTHALMOSCOPY (90D lens)  Right eye - Macula:  attached Periphery:  Attached  Left eye - Macula: Normal Periphery:Normal     Attestation:  I was present with resident during the history and exam.  I discussed the findings, assessment, and plan with the resident and agree with the findings and plan as documented in the resident's note. I reviewed any relevant imaging personally.     Britt Bottom, MD           Uveitis is a serious, complex condition requiring ongoing medical care service coordination with a uveitis specialist as the focal point. [phone])

## 2023-04-15 DIAGNOSIS — H30891 Other chorioretinal inflammations, right eye: Principal | ICD-10-CM

## 2023-04-15 MED ORDER — MYCOPHENOLATE SODIUM 360 MG TABLET,DELAYED RELEASE
ORAL_TABLET | Freq: Two times a day (BID) | ORAL | 0 refills | 90 days | Status: CP
Start: 2023-04-15 — End: ?
  Filled 2023-04-22: qty 120, 30d supply, fill #0

## 2023-04-15 NOTE — Unmapped (Signed)
[facility] Clinical Assessment & Refill Coordination Note  -Patient went to see opthomology on 8/9 and was able to read one line, seeing some benefits.   -Patient has titrated up to 2 tabs BID and tolerating it well.     Brandi [name], DOB: February 29, 1966  Phone: [phone] (home)     All above HIPAA information was verified with patient.     Was a Nurse, learning disability used for this call? No    Specialty Medication(s):   Inflammatory Disorders: mycophenolate     Current Outpatient Medications   Medication Sig Dispense Refill    acetaminophen (TYLENOL) 325 MG tablet Take by mouth.      ergocalciferol-1,250 mcg, 50,000 unit, (DRISDOL) 1,250 mcg (50,000 unit) capsule Take 1 capsule (1,250 mcg total) by mouth once a week.      fluticasone propionate (FLONASE) 50 mcg/actuation nasal spray into each nostril.      gabapentin (NEURONTIN) 300 MG capsule Take 1 capsule (300 mg total) by mouth Three (3) times a day. (Patient taking differently: Take 1 capsule (300 mg total) by mouth once as needed.) 90 capsule 0    loratadine (CLARITIN) 10 mg tablet Take 1 tablet (10 mg total) by mouth daily.      mycophenolate (MYFORTIC) 360 MG TbEC Take 1 tablet (360 mg total) by mouth two (2) times a day. If tolerating 1 tablet twice daily for 1 week, increase to 2 tablets twice daily. 180 tablet 0    omeprazole (PRILOSEC) 20 MG capsule       prednisoLONE acetate (PRED FORTE) 1 % ophthalmic suspension Administer 1 drop to the right eye every four (4) hours. 10 mL 1    simethicone (GAS-X ORAL) Take 1 tablet by mouth two (2) times a day.       Current Facility-Administered Medications   Medication Dose Route Frequency Provider Last Rate Last Admin    fluocinolone (YUTIQ) intravitreal implant 0.18 mg  0.18 mg Intravitreal Once Britt Bottom, MD         Facility-Administered Medications Ordered in Other Visits   Medication Dose Route Frequency Provider Last Rate Last Admin    [EXPIRED] triamcinolone acetonide (PF) (XIPERE) injection 4 mg  4 mg suprachoroidal  Britt Bottom, MD   4 mg at 10/16/21 0981        Changes to medications: Dawne reports no changes at this time.    Allergies   Allergen Reactions    Nickel Rash    Shellfish Containing Products Rash       Changes to allergies: No    SPECIALTY MEDICATION ADHERENCE     mycophenolate 360 mg: 14 days of medicine on hand     Medication Adherence    Patient reported X missed doses in the last month: 0  Specialty Medication: Myfortic  Patient is on additional specialty medications: No  Informant: patient          Specialty medication(s) dose(s) confirmed: Regimen is correct and unchanged.     Are there any concerns with adherence? No    Adherence counseling provided? Not needed    CLINICAL MANAGEMENT AND INTERVENTION      Clinical Benefit Assessment:    Do you feel the medicine is effective or helping your condition? Yes    Clinical Benefit counseling provided? Progress note from 04/12/23 shows evidence of clinical benefit    Adverse Effects Assessment:    Are you experiencing any side effects? No    Are you experiencing  difficulty administering your medicine? No    Quality of Life Assessment:    Quality of Life    Rheumatology  Oncology  Dermatology  Cystic Fibrosis          How many days over the past month did your condition  keep you from your normal activities? For example, brushing your teeth or getting up in the morning. Patient declined to answer    Have you discussed this with your provider? Not needed    Acute Infection Status:    Acute infections noted within Epic:  No active infections  Patient reported infection: None    Therapy Appropriateness:    Is therapy appropriate based on current medication list, adverse reactions, adherence, clinical benefit and progress toward achieving therapeutic goals? Yes, therapy is appropriate and should be continued     DISEASE/MEDICATION-SPECIFIC INFORMATION      N/A    Chronic Inflammatory Diseases: Have you experienced any flares in the last month? No    PATIENT SPECIFIC NEEDS     Does the patient have any physical, cognitive, or cultural barriers? No    Is the patient high risk? No    Did the patient require a clinical intervention? No    Does the patient require physician intervention or other additional services (i.e., nutrition, smoking cessation, social work)? No    SOCIAL DETERMINANTS OF HEALTH     At the [facility], we have learned that life circumstances - like trouble affording food, housing, utilities, or transportation can affect the health of many of our patients.   That is why we wanted to ask: are you currently experiencing any life circumstances that are negatively impacting your health and/or quality of life? No    Social Determinants of Psychologist, prison and probation services Strain: Not on file   Internet Connectivity: Not on file   Food Insecurity: Not on file   Tobacco Use: Low Risk  (03/14/2023)    Received from [facility]    Patient History     Smoking Tobacco Use: Never     Smokeless Tobacco Use: Never     Passive Exposure: Not on file   Housing/Utilities: Not on file   Alcohol Use: Not on file   Transportation Needs: Not on file   Substance Use: Not on file   Health Literacy: Not on file   Physical Activity: Sufficiently Active (04/24/2018)    Received from [employer]    Exercise Vital Sign     Days of Exercise per Week: 3 days     Minutes of Exercise per Session: 60 min   Interpersonal Safety: Unknown (04/15/2023)    Interpersonal Safety     Unsafe Where You Currently Live: Not on file     Physically Hurt by Anyone: Not on file     Abused by Anyone: Not on file   Stress: Not on file   Intimate Partner Violence: Not on file   Depression: Not on file   Social Connections: Not on file       Would you be willing to receive help with any of the needs that you have identified today? Not applicable       SHIPPING     Specialty Medication(s) to be Shipped:   Inflammatory Disorders: mycophenolate    Other medication(s) to be shipped: No additional medications requested for fill at this time     Changes to insurance: No    Delivery Scheduled: Yes,  Expected medication delivery date: 8/20.     Medication will be delivered via Next Day Courier to the confirmed prescription address in [facility].    The patient will receive a drug information handout for each medication shipped and additional FDA Medication Guides as required.  Verified that patient has previously received a Conservation officer, historic buildings and a Surveyor, mining.    The patient or caregiver noted above participated in the development of this care plan and knows that they can request review of or adjustments to the care plan at any time.      All of the patient's questions and concerns have been addressed.    Julianne Rice, PharmD   [facility] Specialty Pharmacist

## 2023-05-16 DIAGNOSIS — H30891 Other chorioretinal inflammations, right eye: Principal | ICD-10-CM

## 2023-05-16 MED ORDER — MYCOPHENOLATE SODIUM 360 MG TABLET,DELAYED RELEASE
ORAL_TABLET | Freq: Two times a day (BID) | ORAL | 11 refills | 30 days
Start: 2023-05-16 — End: ?

## 2023-05-17 MED ORDER — MYCOPHENOLATE SODIUM 360 MG TABLET,DELAYED RELEASE
ORAL_TABLET | Freq: Two times a day (BID) | ORAL | 3 refills | 30 days | Status: CP
Start: 2023-05-17 — End: ?
  Filled 2023-05-22: qty 120, 30d supply, fill #0

## 2023-05-20 NOTE — Unmapped (Signed)
[facility] Refill Coordination Note    Brandi [name], DOB: October 04, 1965  Phone: [phone] (home)       All above HIPAA information was verified with patient.         05/17/2023     5:15 PM   Specialty Rx Medication Refill Questionnaire   Which Medications would you like refilled and shipped? Mycophenolate 360mg    Please list all current allergies: Seafood and Nickel   Have you missed any doses in the last 30 days? Yes   If Yes, how many doses have you missed ? 0-2   Have you had any changes to your medication(s) since your last refill? No   How many days remaining of each medication do you have at home? 10   Have you experienced any side effects in the last 30 days? No   Please enter the full address (street address, city, state, zip code) where you would like your medication(s) to be delivered to. [address] , [place], [place] [address]   Please specify on which day you would like your medication(s) to arrive. Note: if you need your medication(s) within 3 days, please call the pharmacy to schedule your order at [phone]  05/23/2023   Has your insurance changed since your last refill? No   Would you like a pharmacist to call you to discuss your medication(s)? No   Do you require a signature for your package? (Note: if we are billing Medicare Part B or your order contains a controlled substance, we will require a signature) No         Completed refill call assessment today to schedule patient's medication shipment from the [facility] [phone]).  All relevant notes have been reviewed.       Confirmed patient received a Conservation officer, historic buildings and a Surveyor, mining with first shipment. The patient will receive a drug information handout for each medication shipped and additional FDA Medication Guides as required.         REFERRAL TO PHARMACIST     Referral to the pharmacist: Yes - routine compliance concerns. Patient has missed 1-3 doses of medication. Refills were scheduled and concern routed to pharmacist for evaluation.      SHIPPING     Shipping address confirmed in Epic.     Delivery Scheduled: Yes, Expected medication delivery date: 05/23/23.     Medication will be delivered via Next Day Courier to the prescription address in Epic WAM.    Darryl Nestle, PharmD   [facility] Specialty Pharmacist

## 2023-05-22 NOTE — Unmapped (Signed)
Assessment/Plan:   Brandi [name] is a 57 y.o. female with PMH of macular edema, glaucoma, cataract of left eye, seasonal allergies, multifocal choroiditis of right eye here for follow up.    # Multifocal Choroiditis, right eye   Patient has had a longstanding history of multifocal choroiditis of the right eye and follows with Dr. Beckey [name]. She has had multiple rounds of intra ocular steroids, most recently [place]. She has had an extensive workup in the past for infectious and autoimmune causes which were negative. There is no evidence of systemic autoimmune cause of her multifocal choroiditis. She has no evidence of ANCA vasculitis, sarcoidosis, Behcet's, seronegative spondyloarthropathy or SLE. 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 Switched to Myfortic due to GI side effects which she is tolerating.   Plan:  - Continue Myfortic 720 mg bid, will reach out to Dr. Beckey [name] to see dose increase is required based on recent eye exam   - Continue close follow up with Dr. Beckey [name]  - Obtain medication monitoring labs today, see below     HCM:  - PCV 20: June 2024   - Flu and COVID booster today   - Shingles: 09/2022, 11/2022    [name] was seen today for follow-up.    Diagnoses and all orders for this visit:    Multifocal choroiditis, unspecified laterality  -     CBC w/ Differential  -     Comprehensive Metabolic Panel    Medication monitoring encounter  -     CBC w/ Differential  -     Comprehensive Metabolic Panel    Other orders  -     COVID-19 VAC, (48YR+) (COMIRNATY) MRNA PFIZER   -     INFLUENZA VACCINE IIV3(IM)(PF)6 MOS UP        Return in about 6 months (around 11/24/2023).    Patient discussed and seen with attending physician, Dr.Ishizawar    I appreciate the opportunity to participate and collaborate in the care of this patient.    Brandi Amor, MD  PGY5 Rheumatology Fellow     History of Present Illness: Primary Care Provider: Ammie Dalton, MD    HPI:  Brandi [name] is a 57 y.o.  female with PMH of macular edema, glaucoma, cataract of left eye, seasonal allergies, multifocal choroiditis of right eye here for follow up.     Background:   Patient reports she was initially diagnosed with multifocal choroiditis 13 years ago.  She has had an extensive workup including infectious and autoimmune testing in the past which was negative. She has been treated at [facility] with intra-articular steroids. She has developed glaucoma after multiple years of steroid use and is s/p surgery for glaucoma.  She follows with Dr. Beckey [name] for multifocal chorditis.  Previously received Xipere steroid injection and received Yutiq steroid injection prior to her glaucoma surgery. She is still on pred forte.  Dr. Beckey [name] referred her to rheumatology for consideration of starting immunosuppression.  At her initial visit with rheumatology in June 2024 she was started on Cellcept which was changed to Myfortic due to GI side effects.    Last seen: June 2024     Interval history:   Started on Cellcept which patient could not tolerate due to GI side effects. Switched to Myfortic. She saw Dr. Beckey [name] last month, improvement since starting Myfortic. She reports vision in her right eye has improved from before. Denies recent  infections.   She continues on pred forte eye drops bid. She reports she has right sided headaches and reports her BP has been elevated, she has not seen her PCP yet.     Balance of 10 systems was reviewed and is negative except for that mentioned in the HPI.    Review of records: I have reviewed labs/images/clinic notes per the computerized medical record.       Allergies:  Nickel and Shellfish containing products    Medications:     Current Outpatient Medications:     acetaminophen (TYLENOL) 325 MG tablet, Take by mouth., Disp: , Rfl:     fluticasone propionate (FLONASE) 50 mcg/actuation nasal spray, into each nostril., Disp: , Rfl:     loratadine (CLARITIN) 10 mg tablet, Take 1 tablet (10 mg total) by mouth daily., Disp: , Rfl:     mycophenolate (MYFORTIC) 360 MG TbEC, Take 2 tablets (720 mg total) by mouth two (2) times a day., Disp: 120 tablet, Rfl: 3    prednisoLONE acetate (PRED FORTE) 1 % ophthalmic suspension, Administer 1 drop to the right eye every four (4) hours., Disp: 10 mL, Rfl: 1    simethicone (GAS-X ORAL), Take 1 tablet by mouth two (2) times a day., Disp: , Rfl:     ergocalciferol-1,250 mcg, 50,000 unit, (DRISDOL) 1,250 mcg (50,000 unit) capsule, Take 1 capsule (1,250 mcg total) by mouth once a week. (Patient not taking: Reported on 05/27/2023), Disp: , Rfl:     gabapentin (NEURONTIN) 300 MG capsule, Take 1 capsule (300 mg total) by mouth Three (3) times a day. (Patient taking differently: Take 1 capsule (300 mg total) by mouth once as needed.), Disp: 90 capsule, Rfl: 0    omeprazole (PRILOSEC) 20 MG capsule, , Disp: , Rfl:     Current Facility-Administered Medications:     fluocinolone (YUTIQ) intravitreal implant 0.18 mg, 0.18 mg, Intravitreal, Once, Willett, Kirstie Peri, MD    Facility-Administered Medications Ordered in Other Visits:     [EXPIRED] triamcinolone acetonide (PF) (XIPERE) injection 4 mg, 4 mg, suprachoroidal, , Brandi [name], Kirstie Peri, MD, 4 mg at 10/16/21 0981    Medical History:  Past Medical History:   Diagnosis Date    Allergic rhinitis     Anisocoria     Breast cancer (CMS-HCC)     Cataract     Left eye    CME (cystoid macular edema)     Eczema     Onset age 73 to about age 66 was treated w/systemic steroids    GERD (gastroesophageal reflux disease)     Multifocal choroiditis of right eye     Pseudophakia, right eye     PVD (posterior vitreous detachment), bilateral     Steroid induced glaucoma, right eye        Surgical History:  Past Surgical History:   Procedure Laterality Date    BREAST LUMPECTOMY      CATARACT EXTRACTION Right     CESAREAN SECTION      x2    EYE SURGERY      GLAUCOMA SURGERY Right HYSTERECTOMY      LAPAROSCOPIC SUPRACERVICAL HYSTERECTOMY      PR CORNEAL TRANSPLANT,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    Procedure: DSEK KERATOPLASTY (CORNEAL TRANSPLANT); ENDOTHELIAL;  Surgeon: Garner Gavel, MD;  [facility];  Service: Ophthalmology    PR INJECTION,ANT CHAMBER,EYE,AIR/LIQUID Right 10/09/2022    Procedure: INJECTION, ANTERIOR CHAMBER OF EYE (SEP PROCEDURE); AIR OR LIQUID;  Surgeon: Garner Gavel, MD;  Location: [facility] OR [facility];  Service: Ophthalmology    PR REVJ AQUEOUS SHUNT EXTRAOCULAR RESERVOIR W/GRAFT Right 09/27/2022    Procedure: REVIS AQUEOUS SHUNT-EXTRAOCULAR RESERVOIR;  Surgeon: Arville Care, MD;  Location: [facility] OR [facility];  Service: Ophthalmology       Social History:  Social History     Tobacco Use    Smoking status: Never    Smokeless tobacco: Never   Vaping Use    Vaping status: Never Used   Substance Use Topics    Alcohol use: No    Drug use: Never       Family History:  Family History   Problem Relation Age of Onset    Cancer Mother     Heart disease Father     No Known Problems Sister     No Known Problems Brother     No Known Problems Maternal Aunt     No Known Problems Maternal Uncle     No Known Problems Paternal Aunt     No Known Problems Paternal Uncle     Glaucoma Maternal Grandmother     No Known Problems Maternal Grandfather     No Known Problems Paternal Grandmother     No Known Problems Paternal Grandfather     No Known Problems Other     Amblyopia Neg Hx     Blindness Neg Hx     Cataracts Neg Hx     Macular degeneration Neg Hx     Retinal detachment Neg Hx     Strabismus Neg Hx     Diabetes Neg Hx     Hypertension Neg Hx     Stroke Neg Hx     Thyroid disease Neg Hx            Objective   Vitals:    05/27/23 1148   BP: 139/87   Pulse: 61   Temp: 36.3 ??C (97.4 ??F)   TempSrc: Temporal   Weight: 70.9 kg (156 lb 6.4 oz)       Physical Exam:  General: Well-appearing.  HEENT: Adequate tear meniscus. Sclera anicteric; PERRL. MMM. Adequate sublingual salivary pooling. No oral mucosal ulcers.  Pulmonary: Unremarkable respiratory effort. CTAB.  Cardiac: S1 and S2 present, RRR. No murmurs/rubs/gallops.  Neuro: ambulation to exam table, Moving all four extremities.  Psych: Euthymic affect.  MSK: No overt bony deformities or gross abnormalities in range of motion.   Hands: No swelling or tenderness to palpation of the MCPs, PIPs, or DIPs.  Wrists: No swelling or tenderness to palpation of the wrists or deficit in range of motion.  Elbows: No swelling or tenderness to palpation of the elbows.  Shoulders: No swelling or tenderness to palpation of the bilateral shoulders..  Hips: No swelling or tenderness to palpation of the lateral hips. Negative FABER testing.  Knees: No swelling or tenderness to palpation of the bilateral knees.  Ankles: No swelling or tenderness to palpation of the bilateral ankles.  Feet: No swelling or tenderness to palpation of the ankle or MTPs.  Skin: No evidence of erythema or rash.  Extremities: Warm, no LE edema.    Test Results  No results found for: ANA, PAT1, ANATITER1, PAT2, ANATITER2, PAT3, ANATITER3, PAT4, ANATITER4    No results found for: C3, C4, CKTOTAL, CRP, ESR, LDH, DSDNAAB, RF    No results found for: CREATUR, PROTEINUR, PCRATIOUR    No results found for: EJO1, ERNP, ESCL, ESMA, ESSA, ESSB    Lab Results   Component  Value Date    HBSAG Nonreactive 02/25/2023    QFTTBGOLD Negative 02/25/2023    HEPCAB Nonreactive 02/25/2023       Lab Results   Component Value Date    WBC 5.8 05/27/2023       Lab Results   Component Value Date    ANCAIFA Negative 02/25/2023    MPO Negative 02/25/2023    MPOQT 3.0 02/25/2023    PR3ELISA Negative 02/25/2023

## 2023-05-27 ENCOUNTER — Ambulatory Visit
Admit: 2023-05-27 | Discharge: 2023-05-28 | Payer: PRIVATE HEALTH INSURANCE | Attending: Student in an Organized Health Care Education/Training Program | Primary: Student in an Organized Health Care Education/Training Program

## 2023-05-27 DIAGNOSIS — Z5181 Encounter for therapeutic drug level monitoring: Principal | ICD-10-CM

## 2023-05-27 DIAGNOSIS — H30899 Other chorioretinal inflammations, unspecified eye: Principal | ICD-10-CM

## 2023-05-27 LAB — COMPREHENSIVE METABOLIC PANEL
ALBUMIN: 4.2 g/dL (ref 3.4–5.0)
ALKALINE PHOSPHATASE: 87 U/L (ref 46–116)
ALT (SGPT): 20 U/L (ref 10–49)
ANION GAP: 6 mmol/L (ref 5–14)
AST (SGOT): 20 U/L (ref <=34)
BILIRUBIN TOTAL: 1.1 mg/dL (ref 0.3–1.2)
BLOOD UREA NITROGEN: 11 mg/dL (ref 9–23)
BUN / CREAT RATIO: 15
CALCIUM: 9.8 mg/dL (ref 8.7–10.4)
CHLORIDE: 107 mmol/L (ref 98–107)
CO2: 28.5 mmol/L (ref 20.0–31.0)
CREATININE: 0.75 mg/dL
EGFR CKD-EPI (2021) FEMALE: >90 mL/min/{1.73_m2} (ref >=60)
GLUCOSE RANDOM: 85 mg/dL (ref 70–179)
POTASSIUM: 3.9 mmol/L (ref 3.4–4.8)
PROTEIN TOTAL: 7.1 g/dL (ref 5.7–8.2)
SODIUM: 141 mmol/L (ref 135–145)

## 2023-05-27 LAB — CBC W/ AUTO DIFF
BASOPHILS ABSOLUTE COUNT: 0 10*9/L (ref 0.0–0.1)
BASOPHILS RELATIVE PERCENT: 0.3 %
EOSINOPHILS ABSOLUTE COUNT: 0.1 10*9/L (ref 0.0–0.5)
EOSINOPHILS RELATIVE PERCENT: 1.2 %
HEMATOCRIT: 39 % (ref 34.0–44.0)
HEMOGLOBIN: 12.7 g/dL (ref 11.3–14.9)
LYMPHOCYTES ABSOLUTE COUNT: 1.6 10*9/L (ref 1.1–3.6)
LYMPHOCYTES RELATIVE PERCENT: 27.6 %
MEAN CORPUSCULAR HEMOGLOBIN CONC: 32.5 g/dL (ref 32.0–36.0)
MEAN CORPUSCULAR HEMOGLOBIN: 27.6 pg (ref 25.9–32.4)
MEAN CORPUSCULAR VOLUME: 85.2 fL (ref 77.6–95.7)
MEAN PLATELET VOLUME: 10.9 fL — ABNORMAL HIGH (ref 6.8–10.7)
MONOCYTES ABSOLUTE COUNT: 0.4 10*9/L (ref 0.3–0.8)
MONOCYTES RELATIVE PERCENT: 7 %
NEUTROPHILS ABSOLUTE COUNT: 3.7 10*9/L (ref 1.8–7.8)
NEUTROPHILS RELATIVE PERCENT: 63.9 %
PLATELET COUNT: 139 10*9/L — ABNORMAL LOW (ref 150–450)
RED BLOOD CELL COUNT: 4.58 10*12/L (ref 3.95–5.13)
RED CELL DISTRIBUTION WIDTH: 14.8 % (ref 12.2–15.2)
WBC ADJUSTED: 5.8 10*9/L (ref 3.6–11.2)

## 2023-05-27 NOTE — Unmapped (Signed)
Dear Colin Rhein    It was nice to see you!     Today in clinic we discussed the following:     Please get your flu vaccine at your local pharmacy, COVID booster today   Continue Myfortic 720 mg twice daily, will reach out to Dr. Beckey Downing to see if we need to increase dose and send you a message   Labs today     Please know you can reach out to me if you have any issues getting a medication (availability or cost) that I prescribe.    If you have non-urgent questions, the best way to contact us is to send a MyChart message by visiting [email or web address].  You can also use MyChart to request refills and access test results.  I will do my best to respond within 2 business days, however occasionally it may take longer. If you have immediate concerns, please contact our clinic by phone [phone].     Have a nice day!     Burgess Amor, MD  [facility]  [address]  [place], [place] [address]  Phone: [phone]  Fax: [phone]

## 2023-05-28 DIAGNOSIS — Z5181 Encounter for therapeutic drug level monitoring: Principal | ICD-10-CM

## 2023-06-03 ENCOUNTER — Ambulatory Visit
Admit: 2023-06-03 | Discharge: 2023-06-04 | Payer: PRIVATE HEALTH INSURANCE | Attending: Student in an Organized Health Care Education/Training Program | Primary: Student in an Organized Health Care Education/Training Program

## 2023-06-03 DIAGNOSIS — Z947 Corneal transplant status: Principal | ICD-10-CM

## 2023-06-03 MED ORDER — PREDNISOLONE ACETATE 1 % EYE DROPS,SUSPENSION
Freq: Three times a day (TID) | OPHTHALMIC | 3 refills | 67 days | Status: CP
Start: 2023-06-03 — End: ?

## 2023-06-03 NOTE — Unmapped (Signed)
Graft Failure OD  S/p DSEK Rebubble + Anchoring Sutures OD (10/09/22)  S/p DSEK + tube repositioning OD (Ranjan/Fleischman/Pascoe)(09/27/22)   - DSAEK stable from prior visit 02/22/23  - stromal edema/haze 2/2 graft failure, but not bullae,VA 20/800 PH 20/500  - OCT from 10/2022 demonstrates significant corrugation of the RPE OD which may be contributing to decreased BCVA (follows with Willett for hx of multifocal choroiditis). AC is also significantly shallow with peripheral IK touch, which would make a repeat transplant challenging. At this time I advise observation, pt is onboard with this plan    - Pt interested in contact lens to improve appearance of iris OD. Ok from cornea standpoint to wear contact lenses.        Plan:  - taper PF to daily  RTC Ranjan 5-6 months

## 2023-06-05 ENCOUNTER — Ambulatory Visit: Admit: 2023-06-05 | Discharge: 2023-06-06

## 2023-06-05 DIAGNOSIS — H179 Unspecified corneal scar and opacity: Principal | ICD-10-CM

## 2023-06-07 NOTE — Unmapped (Signed)
1) Corneal opacity OD 2^ graft failure, patient successfully completed I&R training, improvement in cosmesis with [employer]  -- trial CLs dispensed  -- patient educated on proper lens care and hygiene  -- starter kit dispensed  -- RTC in 3 weeks with Dr. Sheppard Evens for CL follow-up

## 2023-06-13 NOTE — Unmapped (Signed)
[facility] Special[facility] and Home Delivery Pharmacy Refill Coordination Note    Brandi [name], DOB: [birth year]  Phone: [phone] (home)       All above HIPAA information was verified with patient.         06/13/2023     6:55 AM   Specialty Rx Medication Refill Questionnaire   Which Medications would you like refilled and shipped? Mycophenolate 360   Please list all current allergies: Seafood, Nickel   Have you missed any doses in the last 30 days? No   Have you had any changes to your medication(s) since your last refill? No   How many days remaining of each medication do you have at home? 14   Have you experienced any side effects in the last 30 days? No   Please enter the full address (street address, city, state, zip code) where you would like your medication(s) to be delivered to. [address] , [place], [place] [address]   Please specify on which day you would like your medication(s) to arrive. Note: if you need your medication(s) within 3 days, please call the pharmacy to schedule your order at [phone]  06/21/2023   Has your insurance changed since your last refill? No   Would you like a pharmacist to call you to discuss your medication(s)? No   Do you require a signature for your package? (Note: if we are billing Medicare Part B or your order contains a controlled substance, we will require a signature) No         Completed refill call assessment today to schedule patient's medication shipment from the [facility] Pharm[facility] [phone]).  All relevant notes have been reviewed.       Confirmed patient received a Conservation officer, historic buildings and a Surveyor, mining with first shipment. The patient will receive a drug information handout for each medication shipped and additional FDA Medication Guides as required.         REFERRAL TO PHARMACIST     Referral to the pharmacist: Not needed      [facility]     Shipping address confirmed in Epic.     Delivery Scheduled: Yes, Expected medication delivery date: 06/21/23.     Medication will be delivered via Next Day Courier to the prescription address in Epic WAM.    Brandi [name]   [facility] Delivery Pharm[facility] Specialty Technician

## 2023-06-20 DIAGNOSIS — H30891 Other chorioretinal inflammations, right eye: Principal | ICD-10-CM

## 2023-06-20 MED ORDER — MYCOPHENOLATE SODIUM 360 MG TABLET,DELAYED RELEASE
ORAL_TABLET | Freq: Two times a day (BID) | ORAL | 2 refills | 30.00000 days | Status: CP
Start: 2023-06-20 — End: 2023-06-20
  Filled 2023-06-20: qty 180, 30d supply, fill #0

## 2023-06-20 NOTE — Unmapped (Signed)
Clinical Assessment Needed For: Dose Change  Medication: Mycophenolate 360mg  EC tablet  Last Fill Date/Day Supply: 05/22/2023 / 30 days  Copay $0  Was previous dose already scheduled to fill: Yes    Notes to Pharmacist: Scheduled to fill 10/17. Note sent to pt via Epic

## 2023-06-20 NOTE — Unmapped (Signed)
[facility] Pharmacist has reviewed a new prescription for mycophenolate (MYFORTIC) 360 MG TbEC  that indicates a dose increase.  Patient was counseled on this dosage change by Dr. Toy Cookey - see epic note from 06/20/2023.  Next refill call date adjusted if necessary.

## 2023-06-28 ENCOUNTER — Ambulatory Visit: Admit: 2023-06-28 | Discharge: 2023-06-29 | Payer: PRIVATE HEALTH INSURANCE

## 2023-06-28 LAB — CBC W/ AUTO DIFF
BASOPHILS ABSOLUTE COUNT: 0 10*9/L (ref 0.0–0.1)
BASOPHILS RELATIVE PERCENT: 0.6 %
EOSINOPHILS ABSOLUTE COUNT: 0.1 10*9/L (ref 0.0–0.5)
EOSINOPHILS RELATIVE PERCENT: 1.4 %
HEMATOCRIT: 40.3 % (ref 34.0–44.0)
HEMOGLOBIN: 13.2 g/dL (ref 11.3–14.9)
LYMPHOCYTES ABSOLUTE COUNT: 1.7 10*9/L (ref 1.1–3.6)
LYMPHOCYTES RELATIVE PERCENT: 29.5 %
MEAN CORPUSCULAR HEMOGLOBIN CONC: 32.7 g/dL (ref 32.0–36.0)
MEAN CORPUSCULAR HEMOGLOBIN: 28.1 pg (ref 25.9–32.4)
MEAN CORPUSCULAR VOLUME: 86 fL (ref 77.6–95.7)
MEAN PLATELET VOLUME: 10.6 fL (ref 6.8–10.7)
MONOCYTES ABSOLUTE COUNT: 0.5 10*9/L (ref 0.3–0.8)
MONOCYTES RELATIVE PERCENT: 8.6 %
NEUTROPHILS ABSOLUTE COUNT: 3.4 10*9/L (ref 1.8–7.8)
NEUTROPHILS RELATIVE PERCENT: 59.9 %
NUCLEATED RED BLOOD CELLS: 0 /100{WBCs} (ref <=4)
PLATELET COUNT: 142 10*9/L — ABNORMAL LOW (ref 150–450)
RED BLOOD CELL COUNT: 4.69 10*12/L (ref 3.95–5.13)
RED CELL DISTRIBUTION WIDTH: 15.1 % (ref 12.2–15.2)
WBC ADJUSTED: 5.7 10*9/L (ref 3.6–11.2)

## 2023-06-28 LAB — COMPREHENSIVE METABOLIC PANEL
ALBUMIN: 4.2 g/dL (ref 3.4–5.0)
ALKALINE PHOSPHATASE: 98 U/L (ref 46–116)
ALT (SGPT): 19 U/L (ref 10–49)
ANION GAP: 7 mmol/L (ref 5–14)
AST (SGOT): 22 U/L (ref <=34)
BILIRUBIN TOTAL: 0.9 mg/dL (ref 0.3–1.2)
BLOOD UREA NITROGEN: 12 mg/dL (ref 9–23)
BUN / CREAT RATIO: 16
CALCIUM: 9.7 mg/dL (ref 8.7–10.4)
CHLORIDE: 108 mmol/L — ABNORMAL HIGH (ref 98–107)
CO2: 30.6 mmol/L (ref 20.0–31.0)
CREATININE: 0.77 mg/dL
EGFR CKD-EPI (2021) FEMALE: >90 mL/min/{1.73_m2} (ref >=60)
GLUCOSE RANDOM: 88 mg/dL (ref 70–179)
POTASSIUM: 4.3 mmol/L (ref 3.4–4.8)
PROTEIN TOTAL: 7.5 g/dL (ref 5.7–8.2)
SODIUM: 146 mmol/L — ABNORMAL HIGH (ref 135–145)

## 2023-07-01 ENCOUNTER — Ambulatory Visit: Admit: 2023-07-01 | Discharge: 2023-07-02

## 2023-07-02 NOTE — Unmapped (Signed)
1) Corneal opacity OD 2^ graft failure, improvement in cosmesis with [employer], patient reports she is happy with them and wears them a couple of times per week  -- finalized CL Rx  -- patient re-educated on proper lens care and hygiene  -- continue spec wear for protection  -- RTC in 1 year with Dr. Sheppard Evens for CLs, keep appointments with other [employer] doctors

## 2023-07-07 ENCOUNTER — Ambulatory Visit
Admit: 2023-07-07 | Discharge: 2023-07-08 | Disposition: A | Discharge status: Home / Self Care | Payer: PRIVATE HEALTH INSURANCE | Attending: Student in an Organized Health Care Education/Training Program

## 2023-07-07 ENCOUNTER — Emergency Department
Admit: 2023-07-07 | Discharge: 2023-07-08 | Disposition: A | Discharge status: Home / Self Care | Payer: PRIVATE HEALTH INSURANCE | Attending: Student in an Organized Health Care Education/Training Program

## 2023-07-07 DIAGNOSIS — J069 Acute upper respiratory infection, unspecified: Principal | ICD-10-CM

## 2023-07-07 MED ORDER — AMOXICILLIN 875 MG-POTASSIUM CLAVULANATE 125 MG TABLET
ORAL_TABLET | Freq: Two times a day (BID) | ORAL | 0 refills | 7 days | Status: CP
Start: 2023-07-07 — End: 2023-07-14

## 2023-07-08 NOTE — Unmapped (Signed)
The patient reports a cough, decreased appetite, ha, mild dizziness, and an itchy throat since she attended barbeque festival on Saturday. 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

## 2023-07-08 NOTE — Unmapped (Signed)
[facility]  Emergency Department Provider Note        ED Clinical Impression     Final diagnoses:   Upper respiratory tract infection, unspecified type (Primary)       ED Assessment/Plan   Brandi [name] 57 y.o. patient who  has a past medical history of Allergic rhinitis, Anisocoria, Breast cancer (CMS-HCC), Cataract, CME (cystoid macular edema), Eczema, GERD (gastroesophageal reflux disease), Multifocal choroiditis of right eye, Pseudophakia, right eye, PVD (posterior vitreous detachment), bilateral, and Steroid induced glaucoma, right eye. she presents with cough, congestion, and mild dizziness concerning for an upper respiratory infection.  On physical exam patient is well-appearing.  Hoarseness is noted in the voice however she denies any sore throat.  Lungs clear to auscultation bilaterally with no adventitious breath sounds.  No lymphadenopathy appreciated and there is no abdominal tenderness.  Other differentials include pneumonia versus acid reflux although symptoms are most consistent with an upper respiratory infection.     EKG, chest x-ray, and respiratory 4 Plex were performed which were all very reassuring.  Therefore out of an abundance of caution we will treat with a course of Augmentin for patient's infection.  Supportive care was also discussed with patient. Red flag symptoms that require emergency medical evaluation was discussed with patient who endorsed understanding and is in agreement with plan.  At the time of discharge patient was in NAD with normal vital signs and normal oxygen saturation tolerating well.          Discussion of Management with other Physicians, QHP or Appropriate Source:  N/A  Independent Interpretation of Studies: EKG: normal EKG, normal sinus rhythm. RAD Chest XR for cardiopulmonary pathology N/A, POCUS  N/A  External Records Reviewed: n/a  Escalation of Care, Consideration of Admission/Observation/Transfer:  N/A  Social determinants that significantly affected care: None applicable  Prescription drug(s) considered but not prescribed:   Diagnostic tests considered but not performed:   History obtained from other sources: None             History     Chief Complaint   Patient presents with    Cough     HPI  Brandi [name] is a 57 year old female who presents to the emergency department for evaluation of cough and congestion that began on Sunday.  Patient states that her symptoms began with a scratchy throat and runny nose.  However by Wednesday her symptoms had gotten progressively worse with decreased appetite and chills.  Patient does take mycophenolate therefore she discontinued this medication and started with TheraFlu.  However she only took the medication once per day.  She does note some improvement with TheraFlu but did not want to take too much of this medication.  She continues to have coughing and hoarseness although her decreased appetite and chills have resolved.  She states that her cough is dry.  She also has had some mild dizziness over the last 2 days that she attributes to decreased p.o. intake.  She denies any other symptoms.    Past Medical History:   Diagnosis Date    Allergic rhinitis     Anisocoria     Breast cancer (CMS-HCC)     Cataract     Left eye    CME (cystoid macular edema)     Eczema     Onset age 43 to about age 65 was treated w/systemic steroids    GERD (gastroesophageal reflux disease)     Multifocal choroiditis of right eye  Pseudophakia, right eye     PVD (posterior vitreous detachment), bilateral     Steroid induced glaucoma, right eye        Past Surgical History:   Procedure Laterality Date    BREAST LUMPECTOMY      CATARACT EXTRACTION Right     CESAREAN SECTION      x2    EYE SURGERY      GLAUCOMA SURGERY Right     HYSTERECTOMY      LAPAROSCOPIC SUPRACERVICAL HYSTERECTOMY      PR CORNEAL TRANSPLANT,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    Procedure: DSEK KERATOPLASTY (CORNEAL TRANSPLANT); ENDOTHELIAL;  Surgeon: Garner Gavel, MD; Location: [facility] OR [facility];  Service: Ophthalmology    PR INJECTION,ANT CHAMBER,EYE,AIR/LIQUID Right 10/09/2022    Procedure: INJECTION, ANTERIOR CHAMBER OF EYE (SEP PROCEDURE); AIR OR LIQUID;  Surgeon: Garner Gavel, MD;  [facility] OR [facility];  Service: Ophthalmology    PR REVJ AQUEOUS SHUNT EXTRAOCULAR RESERVOIR W/GRAFT Right 09/27/2022    Procedure: REVIS AQUEOUS SHUNT-EXTRAOCULAR RESERVOIR;  Surgeon: Arville Care, MD;  [facility] OR [facility];  Service: Ophthalmology       Family History   Problem Relation Age of Onset    Cancer Mother     Heart disease Father     No Known Problems Sister     No Known Problems Brother     No Known Problems Maternal Aunt     No Known Problems Maternal Uncle     No Known Problems Paternal Aunt     No Known Problems Paternal Uncle     Glaucoma Maternal Grandmother     No Known Problems Maternal Grandfather     No Known Problems Paternal Grandmother     No Known Problems Paternal Grandfather     No Known Problems Other     Amblyopia Neg Hx     Blindness Neg Hx     Cataracts Neg Hx     Macular degeneration Neg Hx     Retinal detachment Neg Hx     Strabismus Neg Hx     Diabetes Neg Hx     Hypertension Neg Hx     Stroke Neg Hx     Thyroid disease Neg Hx        Social History     Socioeconomic History    Marital status: Single     Spouse name: None    Number of children: None    Years of education: None    Highest education level: None   Tobacco Use    Smoking status: Never    Smokeless tobacco: Never   Vaping Use    Vaping status: Never Used   Substance and Sexual Activity    Alcohol use: No    Drug use: Never    Sexual activity: Not Currently     Social Determinants of Health     Physical Activity: Sufficiently Active (04/24/2018)    Received from [facility]    Exercise Vital Sign     Days of Exercise per Week: 3 days     Minutes of Exercise per Session: 60 min       Current Facility-Administered Medications   Medication Dose Route Frequency Provider Last Rate Last Admin    fluocinolone (YUTIQ) intravitreal implant 0.18 mg  0.18 mg Intravitreal Once Britt Bottom, MD         Current Outpatient Medications   Medication Sig Dispense Refill    acetaminophen (TYLENOL)  325 MG tablet Take by mouth.      amoxicillin-clavulanate (AUGMENTIN) 875-125 mg per tablet Take 1 tablet by mouth two (2) times a day for 7 days. 14 tablet 0    ergocalciferol-1,250 mcg, 50,000 unit, (DRISDOL) 1,250 mcg (50,000 unit) capsule Take 1 capsule (1,250 mcg total) by mouth once a week. (Patient not taking: Reported on 05/27/2023)      fluticasone propionate (FLONASE) 50 mcg/actuation nasal spray into each nostril.      gabapentin (NEURONTIN) 300 MG capsule Take 1 capsule (300 mg total) by mouth Three (3) times a day. (Patient taking differently: Take 1 capsule (300 mg total) by mouth once as needed.) 90 capsule 0    loratadine (CLARITIN) 10 mg tablet Take 1 tablet (10 mg total) by mouth daily.      mycophenolate (MYFORTIC) 360 MG TbEC Take 3 tablets (1,080 mg total) by mouth two (2) times a day. 180 tablet 2    omeprazole (PRILOSEC) 20 MG capsule  (Patient not taking: Reported on 05/27/2023)      prednisoLONE acetate (PRED FORTE) 1 % ophthalmic suspension Administer 1 drop to the right eye Three (3) times a day. 10 mL 3    simethicone (GAS-X ORAL) Take 1 tablet by mouth two (2) times a day.       Facility-Administered Medications Ordered in Other Encounters   Medication Dose Route Frequency Provider Last Rate Last Admin    [EXPIRED] triamcinolone acetonide (PF) (XIPERE) injection 4 mg  4 mg suprachoroidal  Britt Bottom, MD   4 mg at 10/16/21 1610           Physical Exam     BP 124/87  - Pulse 80  - Temp 36.3 ??C (97.4 ??F)  - Resp 16  - SpO2 99%     Physical Exam  Vitals and nursing note reviewed.   Constitutional:       Appearance: Normal appearance.   HENT:      Head: Normocephalic and atraumatic.      Nose: No congestion.      Mouth/Throat:      Mouth: Mucous membranes are moist.      Pharynx: Oropharynx is clear.   Eyes:      Extraocular Movements: Extraocular movements intact.      Conjunctiva/sclera: Conjunctivae normal.      Pupils: Pupils are equal, round, and reactive to light.   Cardiovascular:      Rate and Rhythm: Normal rate and regular rhythm.      Pulses: Normal pulses.      Heart sounds: Normal heart sounds.   Pulmonary:      Effort: Pulmonary effort is normal.      Breath sounds: Normal breath sounds.   Abdominal:      General: Bowel sounds are normal. There is no distension.      Palpations: Abdomen is soft.      Tenderness: There is no abdominal tenderness.   Musculoskeletal:         General: Normal range of motion.      Cervical back: Normal range of motion and neck supple.   Skin:     General: Skin is warm.      Capillary Refill: Capillary refill takes less than 2 seconds.   Neurological:      Mental Status: She is alert and oriented to person, place, and time.   Psychiatric:         Mood and Affect: Mood normal.  Behavior: Behavior normal.         Thought Content: Thought content normal.         Judgment: Judgment normal.                            Melchor Amour, PA  07/07/23 1920

## 2023-07-17 NOTE — Unmapped (Addendum)
[facility] Refill Coordination Note    Brandi [name], DOB: May 04, 1966  Phone: [phone] (home)       All above HIPAA information was verified with patient.         07/12/2023     8:53 PM   Specialty Rx Medication Refill Questionnaire   Which Medications would you like refilled and shipped? Mycophenolate 360   Please list all current allergies: Nickel and seafood   Have you missed any doses in the last 30 days? Yes   If Yes, how many doses have you missed ? 6-7   Have you had any changes to your medication(s) since your last refill? No   How many days remaining of each medication do you have at home? 21 day or 3 weeks   Have you experienced any side effects in the last 30 days? No   Please enter the full address (street address, city, state, zip code) where you would like your medication(s) to be delivered to. [address] , [place], [place] [address]   Please specify on which day you would like your medication(s) to arrive. Note: if you need your medication(s) within 3 days, please call the pharmacy to schedule your order at [phone]  07/29/2023   Has your insurance changed since your last refill? No   Would you like a pharmacist to call you to discuss your medication(s)? No   Do you require a signature for your package? (Note: if we are billing Medicare Part B or your order contains a controlled substance, we will require a signature) No         Completed refill call assessment today to schedule patient's medication shipment from the [facility] [phone]).  All relevant notes have been reviewed.       Confirmed patient received a Conservation officer, historic buildings and a Surveyor, mining with first shipment. The patient will receive a drug information handout for each medication shipped and additional FDA Medication Guides as required.         REFERRAL TO PHARMACIST     Referral to the pharmacist: Yes - high priority compliance concerns. Patient has missed more than 3 doses of medication. Refills were scheduled and concern routed (high priority) to pharmacist for evaluation.    After chart review, it seems patient had upper respiratory infection recently which resulted in missed dosing.  No urgent concerns -Brandi [name], RPH      SHIPPING     Shipping address confirmed in Epic.     Delivery Scheduled: Yes, Expected medication delivery date: 07/29/23.     Medication will be delivered via Same Day Courier to the prescription address in Epic WAM.    Brandi [name]   [facility] Pharm[facility] Specialty Technician

## 2023-07-29 MED FILL — MYCOPHENOLATE SODIUM 360 MG TABLET,DELAYED RELEASE: ORAL | 30 days supply | Qty: 180 | Fill #1

## 2023-07-30 IMAGING — MG MM DIGITAL SCREENING BILAT W/ TOMO AND CAD
8 series · 9 of 24 positions shown · non-contrast
Comparison: Previous exam(s).

CLINICAL DATA: Screening.

EXAM:
DIGITAL SCREENING BILATERAL MAMMOGRAM WITH TOMOSYNTHESIS AND CAD
TECHNIQUE: Bilateral screening digital craniocaudal and mediolateral oblique
mammograms were obtained. Bilateral screening digital breast
tomosynthesis was performed. The images were evaluated with
computer-aided detection.

[L MLO synth-2D]
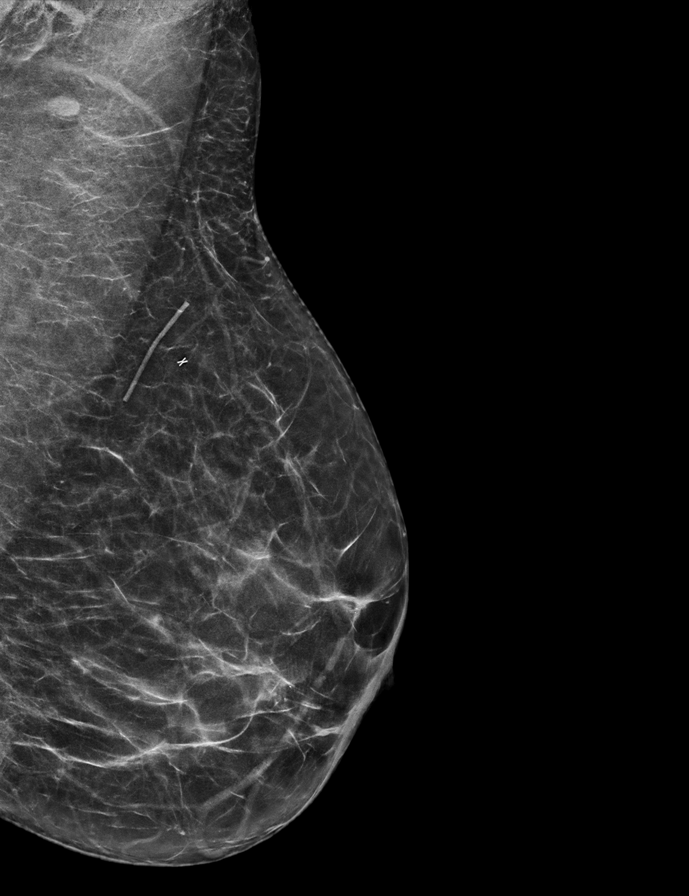

[L CC synth-2D]
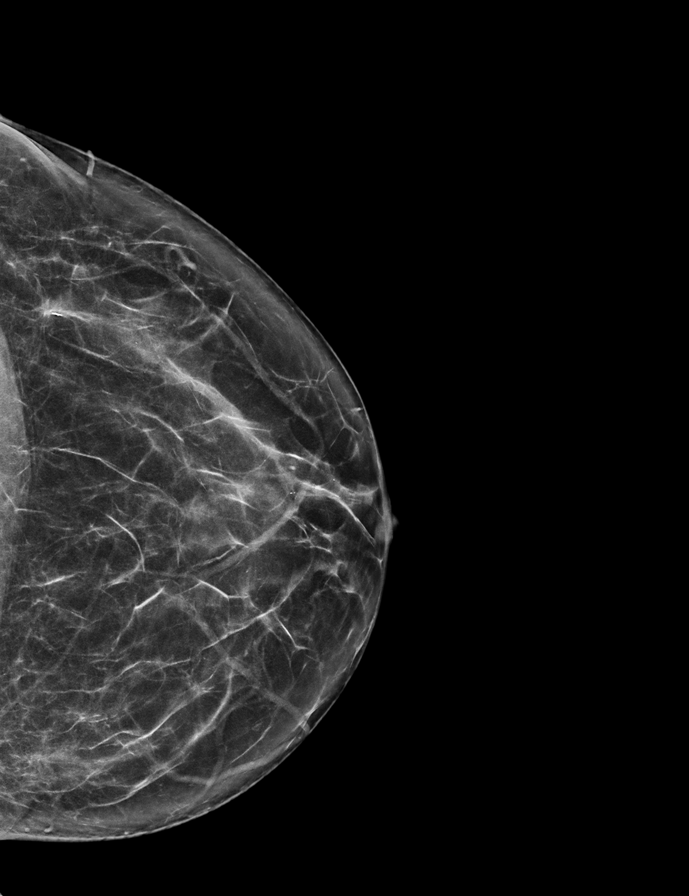

[R CC synth-2D]
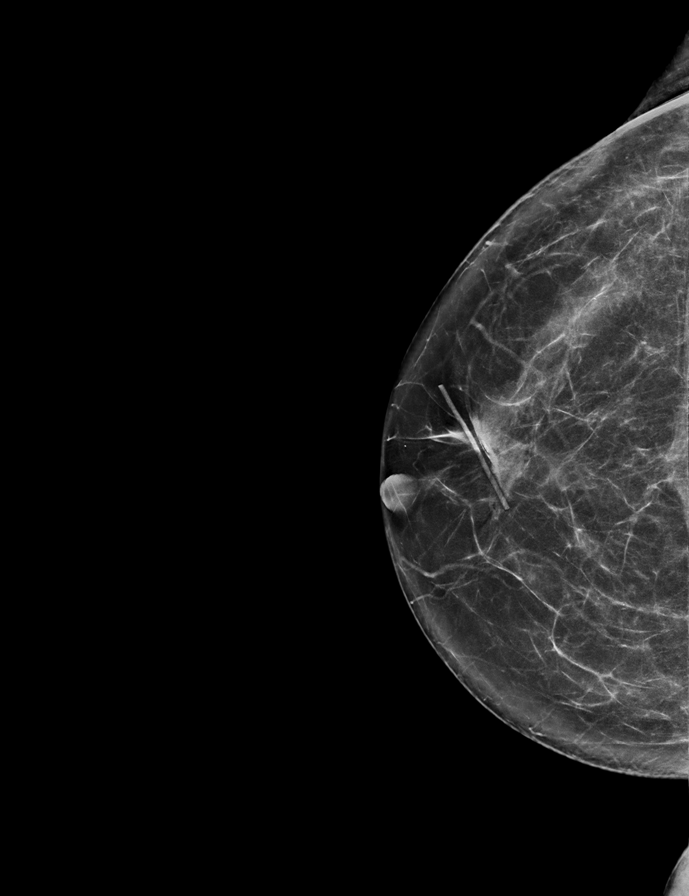

[R MLO synth-2D]
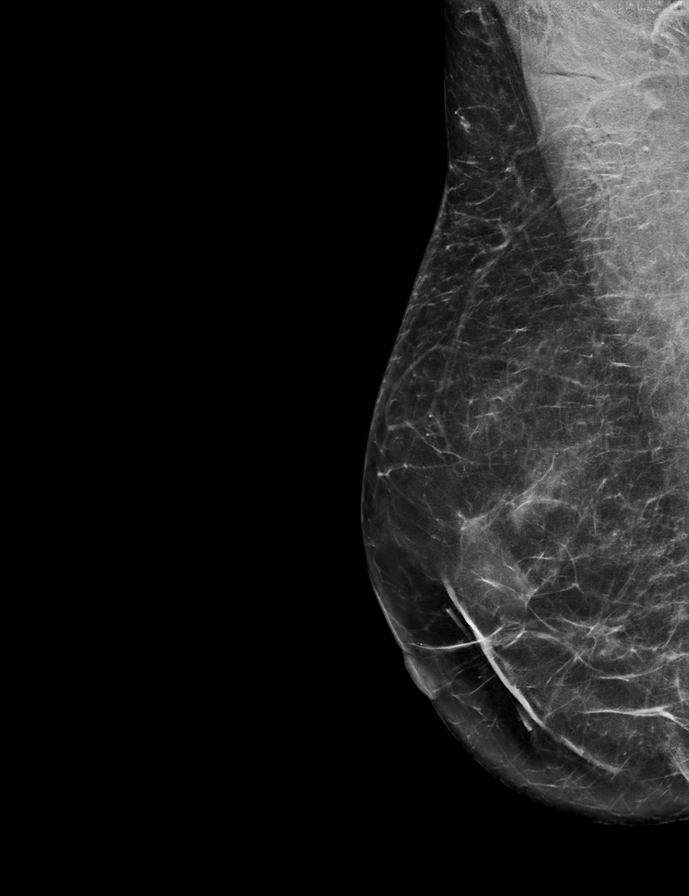

[R CC tomo · 2 of 68 frames shown]
[frame 22/68]
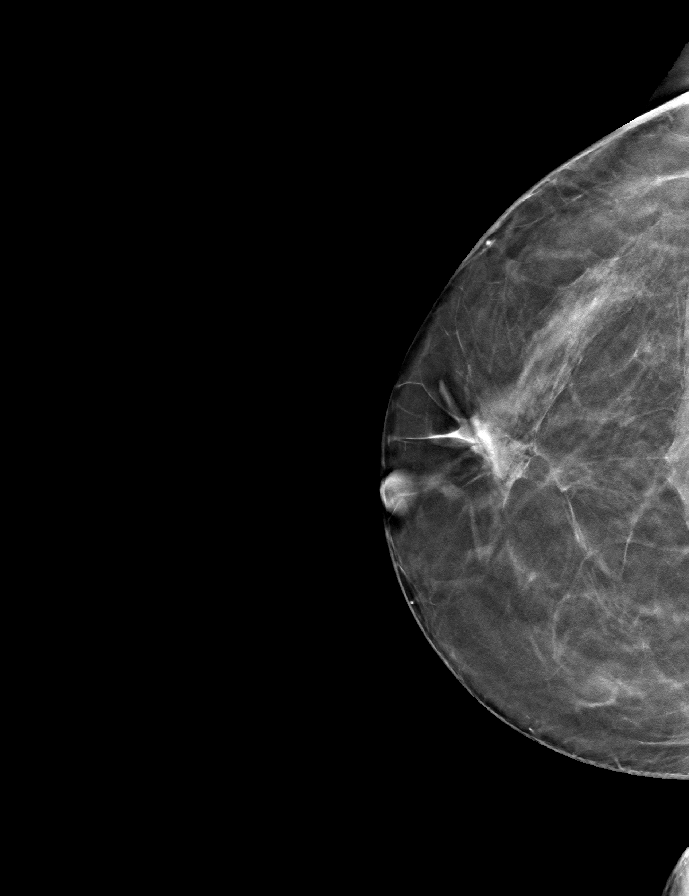
[frame 35/68]
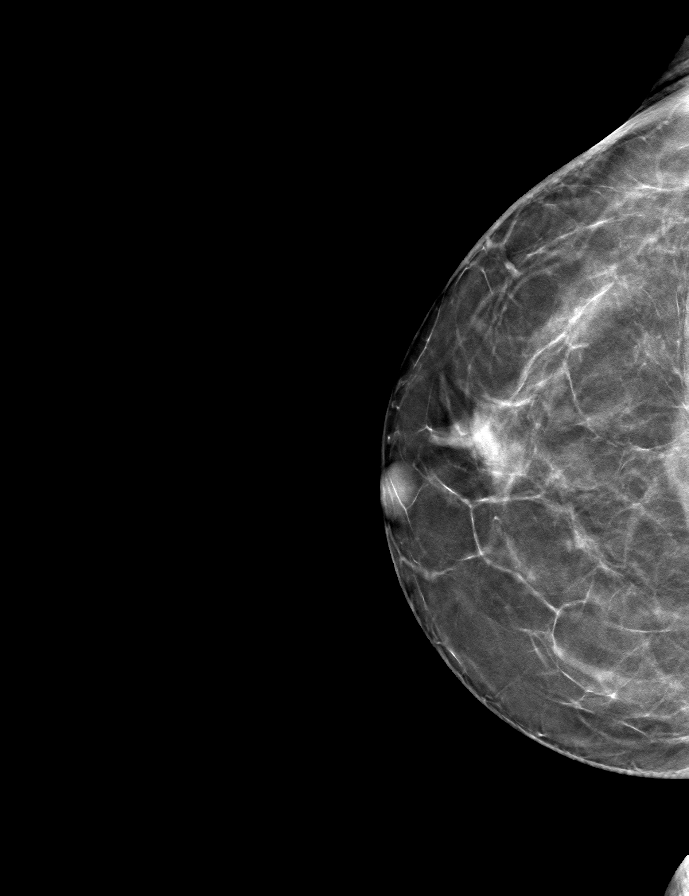

[L MLO tomo · tomo slice 31/62.0]
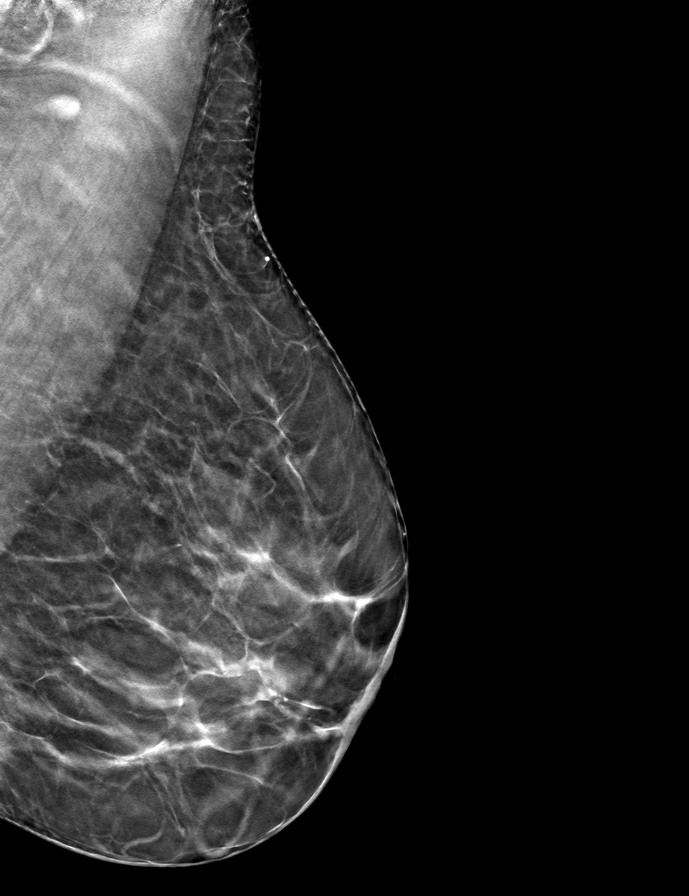

[R MLO tomo · tomo slice 35/68.0]
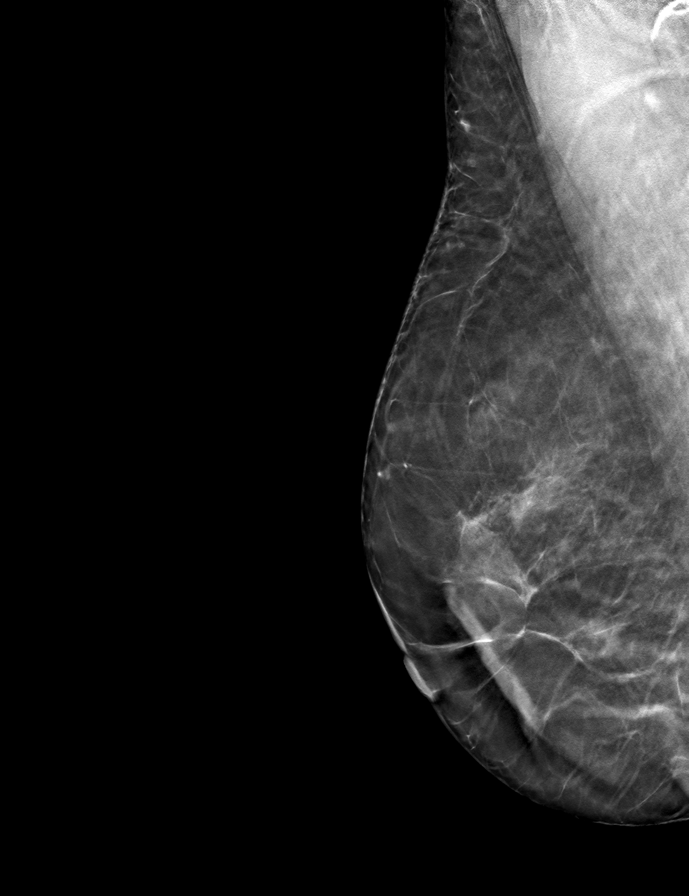

[L CC tomo · tomo slice 32/63.0]
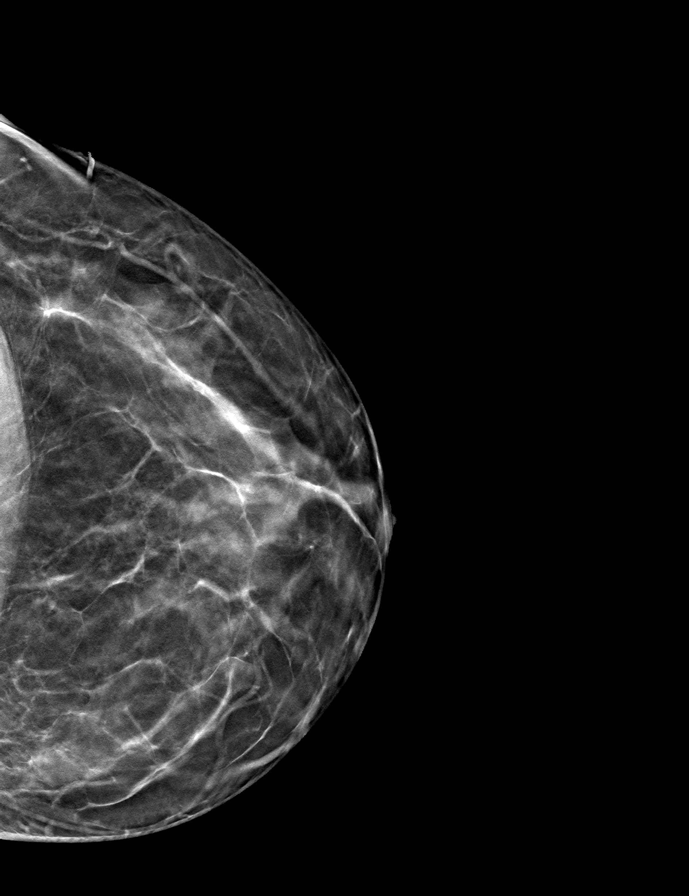

[9 of 24 positions shown; findings below may reference images not displayed]

ACR Breast Density Category b: There are scattered areas of
fibroglandular density.
FINDINGS: In the right breast, calcifications in the upper-outer breast at
middle depth warrant further evaluation with magnified views. In the
left breast, no findings suspicious for malignancy.
IMPRESSION: Further evaluation is suggested for calcifications in the right
breast.

RECOMMENDATION:
Diagnostic mammogram of the right breast. (Code:[id number])

The patient will be contacted regarding the findings, and additional
imaging will be scheduled.

BI-RADS CATEGORY  0: Incomplete. Need additional imaging evaluation
and/or prior mammograms for comparison.

## 2023-08-20 ENCOUNTER — Other Ambulatory Visit: Payer: Self-pay | Admitting: Obstetrics and Gynecology

## 2023-08-20 ENCOUNTER — Ambulatory Visit
Admit: 2023-08-20 | Discharge: 2023-08-20 | Disposition: A | Discharge status: Home / Self Care | Payer: PRIVATE HEALTH INSURANCE | Attending: Family

## 2023-08-20 DIAGNOSIS — Z1231 Encounter for screening mammogram for malignant neoplasm of breast: Secondary | ICD-10-CM

## 2023-08-20 DIAGNOSIS — T50905A Adverse effect of unspecified drugs, medicaments and biological substances, initial encounter: Principal | ICD-10-CM

## 2023-08-21 NOTE — Unmapped (Signed)
Pt states she takes 3 tablets of mycophenolate 360mg  tab twice daily, states she took her afternoon dose around 4 and then forgot and took another dose and is concerned and wanted to come be checked out, she takes the medication for cornea transplant

## 2023-08-21 NOTE — Unmapped (Signed)
 [facility]  Emergency Department Provider Note     ED Clinical Impression     Final diagnoses:   Adverse effect of drug, initial encounter (Primary)      Impression, Medical Decision Making, ED Course     Impression: 57 y.o. female who has a past medical history of Allergic rhinitis, Anisocoria, Breast cancer (CMS-HCC), Cataract, CME (cystoid macular edema), Eczema, GERD (gastroesophageal reflux disease), Multifocal choroiditis of right eye, Pseudophakia, right eye, PVD (posterior vitreous detachment), bilateral, and Steroid induced glaucoma, right eye. who presents for evaluation after accidentally taking an extra loading dose of mycophenolate.  Patient is asymptomatic and hemodynamically stable and without side effects at this time.  Nurse triage discussed case with specialist and state toxicology, plan is for her to skip tomorrow's morning dose of mycophenolate and resume normal afternoon dose.  Patient is agreeable to plan, hemodynamically stable and appropriate for discharge at this time.         History     Chief Complaint  Chief Complaint   Patient presents with    Drug Overdose         Past Medical History:   Diagnosis Date    Allergic rhinitis     Anisocoria     Breast cancer (CMS-HCC)     Cataract     Left eye    CME (cystoid macular edema)     Eczema     Onset age 38 to about age 81 was treated w/systemic steroids    GERD (gastroesophageal reflux disease)     Multifocal choroiditis of right eye     Pseudophakia, right eye     PVD (posterior vitreous detachment), bilateral     Steroid induced glaucoma, right eye        Past Surgical History:   Procedure Laterality Date    BREAST LUMPECTOMY      CATARACT EXTRACTION Right     CESAREAN SECTION      x2    EYE SURGERY      GLAUCOMA SURGERY Right     HYSTERECTOMY      LAPAROSCOPIC SUPRACERVICAL HYSTERECTOMY      PR CORNEAL TRANSPLANT,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    Procedure: DSEK KERATOPLASTY (CORNEAL TRANSPLANT); ENDOTHELIAL;  Surgeon: Garner Gavel, MD;  [facility];  Service: Ophthalmology    PR INJECTION,ANT CHAMBER,EYE,AIR/LIQUID Right 10/09/2022    Procedure: INJECTION, ANTERIOR CHAMBER OF EYE (SEP PROCEDURE); AIR OR LIQUID;  Surgeon: Garner Gavel, MD;  [facility];  Service: Ophthalmology    PR REVJ AQUEOUS SHUNT EXTRAOCULAR RESERVOIR W/GRAFT Right 09/27/2022    Procedure: REVIS AQUEOUS SHUNT-EXTRAOCULAR RESERVOIR;  Surgeon: Arville Care, MD;  [facility]: [facility] OR [facility];  Service: Ophthalmology         Current Facility-Administered Medications:     fluocinolone (YUTIQ) intravitreal implant 0.18 mg, 0.18 mg, Intravitreal, Once, Britt Bottom, MD    Current Outpatient Medications:     acetaminophen (TYLENOL) 325 MG tablet, Take by mouth., Disp: , Rfl:     ergocalciferol-1,250 mcg, 50,000 unit, (DRISDOL) 1,250 mcg (50,000 unit) capsule, Take 1 capsule (1,250 mcg total) by mouth once a week. (Patient not taking: Reported on 05/27/2023), Disp: , Rfl:     fluticasone propionate (FLONASE) 50 mcg/actuation nasal spray, into each nostril., Disp: , Rfl:     gabapentin (NEURONTIN) 300 MG capsule, Take 1 capsule (300 mg total) by mouth Three (3) times a day. (Patient taking differently: Take 1 capsule (300 mg total)  by mouth once as needed.), Disp: 90 capsule, Rfl: 0    loratadine (CLARITIN) 10 mg tablet, Take 1 tablet (10 mg total) by mouth daily., Disp: , Rfl:     mycophenolate (MYFORTIC) 360 MG TbEC, Take 3 tablets (1,080 mg total) by mouth two (2) times a day., Disp: 180 tablet, Rfl: 2    omeprazole (PRILOSEC) 20 MG capsule, , Disp: , Rfl:     prednisoLONE acetate (PRED FORTE) 1 % ophthalmic suspension, Administer 1 drop to the right eye Three (3) times a day., Disp: 10 mL, Rfl: 3    simethicone (GAS-X ORAL), Take 1 tablet by mouth two (2) times a day., Disp: , Rfl:     Facility-Administered Medications Ordered in Other Encounters:     [EXPIRED] triamcinolone acetonide (PF) (XIPERE) injection 4 mg, 4 mg, suprachoroidal, , Beckey Downing, Kirstie Peri, MD, 4 mg at 10/16/21 1610    Allergies  Nickel and Shellfish containing products    Family History  Family History   Problem Relation Age of Onset    Cancer Mother     Heart disease Father     No Known Problems Sister     No Known Problems Brother     No Known Problems Maternal Aunt     No Known Problems Maternal Uncle     No Known Problems Paternal Aunt     No Known Problems Paternal Uncle     Glaucoma Maternal Grandmother     No Known Problems Maternal Grandfather     No Known Problems Paternal Grandmother     No Known Problems Paternal Grandfather     No Known Problems Other     Amblyopia Neg Hx     Blindness Neg Hx     Cataracts Neg Hx     Macular degeneration Neg Hx     Retinal detachment Neg Hx     Strabismus Neg Hx     Diabetes Neg Hx     Hypertension Neg Hx     Stroke Neg Hx     Thyroid disease Neg Hx        Social History  Social History     Tobacco Use    Smoking status: Never    Smokeless tobacco: Never   Vaping Use    Vaping status: Never Used   Substance Use Topics    Alcohol use: No    Drug use: Never        Physical Exam     VITAL SIGNS:      Vitals:    08/20/23 1720   BP: 154/118   Pulse: 87   Resp: 16   Temp: 36.9 ??C (98.4 ??F)   TempSrc: Temporal   SpO2: 99%   Weight: 68.5 kg (151 lb)       Constitutional: Alert and oriented. No acute distress.  Eyes: Conjunctivae are normal.  HEENT: Normocephalic and atraumatic. Conjunctivae clear. No congestion. Moist mucous membranes.   Cardiovascular: Rate as above, regular rhythm. Normal and symmetric distal pulses. Brisk capillary refill. Normal skin turgor.  Respiratory: Normal respiratory effort. Breath sounds are normal. There are no wheezing or crackles heard.  Gastrointestinal: Soft, non-distended, non-tender.  Genitourinary: Deferred.  Musculoskeletal: Non-tender with normal range of motion in all extremities.  Neurologic: Normal speech and language. No gross focal neurologic deficits are appreciated. Patient is moving all extremities equally, face is symmetric at rest and with speech.  Skin: Skin is warm, dry and intact. No rash noted.  Psychiatric: Mood and  affect are normal. Speech and behavior are normal.     Radiology     No orders to display       Pertinent labs & imaging results that were available during my care of the patient were independently interpreted by me and considered in my medical decision making (see chart for details).    Portions of this record have been created using Scientist, clinical (histocompatibility and immunogenetics). Dictation errors have been sought, but may not have been identified and corrected.         Loman Brooklyn, FNP  08/30/23 [phone]

## 2023-08-29 NOTE — Unmapped (Signed)
[facility] Delivery Pharmacy Refill Coordination Note    Brandi [name], DOB: [birth year]  Phone: [phone] (home)       All above HIPAA information was verified with patient.         08/27/2023    12:28 PM   Specialty Rx Medication Refill Questionnaire   Which Medications would you like refilled and shipped? Mycophenolate 360   Please list all current allergies: Nickel and seafood   Have you missed any doses in the last 30 days? No   Have you had any changes to your medication(s) since your last refill? No   How many days remaining of each medication do you have at home? 9   Have you experienced any side effects in the last 30 days? No   Please enter the full address (street address, city, state, zip code) where you would like your medication(s) to be delivered to. [address] , [place] [id number]   Please specify on which day you would like your medication(s) to arrive. Note: if you need your medication(s) within 3 days, please call the pharmacy to schedule your order at [phone]  09/02/2023   Has your insurance changed since your last refill? No   Would you like a pharmacist to call you to discuss your medication(s)? No   Do you require a signature for your package? (Note: if we are billing Medicare Part B or your order contains a controlled substance, we will require a signature) No         Completed refill call assessment today to schedule patient's medication shipment from the [facility] [phone]).  All relevant notes have been reviewed.       Confirmed patient received a Conservation officer, historic buildings and a Surveyor, mining with first shipment. The patient will receive a drug information handout for each medication shipped and additional FDA Medication Guides as required.         REFERRAL TO PHARMACIST     Referral to the pharmacist: Not needed      [facility]     Shipping address confirmed in Epic.     Delivery Scheduled: Yes, Expected medication delivery date: 09/02/23.     Medication will be delivered via Same Day Courier to the prescription address in Epic WAM.    Ian Castagna   [facility] Special[facility] and Home Delivery Pharmacy Specialty Technician

## 2023-09-02 MED FILL — MYCOPHENOLATE SODIUM 360 MG TABLET,DELAYED RELEASE: ORAL | 30 days supply | Qty: 180 | Fill #2

## 2023-09-03 ENCOUNTER — Ambulatory Visit
Admit: 2023-09-03 | Payer: PRIVATE HEALTH INSURANCE | Attending: Student in an Organized Health Care Education/Training Program | Primary: Student in an Organized Health Care Education/Training Program

## 2023-09-03 NOTE — Unmapped (Unsigned)
#   Glaucoma, steroid-response, indeterminate stage, s/p tube shunt, OD  # Glaucoma suspect, OS    - S/p DSEK + tube repositioning RIGHT EYE 09/27/22   - IOP good today, AC still shallow. Graft remains edematous   - HVF unreliable OU but OS without any concerning defects.    -- Plan:  -- continue PF TID  -- continue with Dr. 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   Stable  Observe    #Cataract left eye  Not visually significant  Observe     I saw and evaluated the patient, participating in the key portions of the service.  I reviewed the resident???s note.  I agree with the resident???s findings and plan. Arville Care, MD, MS, FACS

## 2023-09-10 ENCOUNTER — Ambulatory Visit
Admit: 2023-09-10 | Discharge: 2023-09-11 | Payer: PRIVATE HEALTH INSURANCE | Attending: Student in an Organized Health Care Education/Training Program | Primary: Student in an Organized Health Care Education/Training Program

## 2023-09-10 DIAGNOSIS — H4089 Other specified glaucoma: Principal | ICD-10-CM

## 2023-09-10 DIAGNOSIS — H4061X0 Glaucoma secondary to drugs, right eye, stage unspecified: Principal | ICD-10-CM

## 2023-09-10 DIAGNOSIS — T380X5A Adverse effect of glucocorticoids and synthetic analogues, initial encounter: Principal | ICD-10-CM

## 2023-09-10 DIAGNOSIS — H30891 Other chorioretinal inflammations, right eye: Principal | ICD-10-CM

## 2023-09-10 MED ORDER — PREDNISOLONE ACETATE 1 % EYE DROPS,SUSPENSION
Freq: Every day | OPHTHALMIC | 3 refills | 200.00 days | Status: CP
Start: 2023-09-10 — End: ?

## 2023-09-10 NOTE — Unmapped (Signed)
#   Glaucoma, steroid-response, indeterminate stage, s/p tube shunt, OD  # Glaucoma suspect, OS    - S/p DSEK + tube repositioning RIGHT EYE 09/27/22   - IOP good today, AC still shallow. Graft remains edematous   - Prior HVF unreliable OU but OS without any concerning defects.  - RNFL left eye full and stable, no view right eye   -- Plan:  -- continue PF daily   -- continue with Dr. Jhonnie Garner as scheduled   -- RTC 6 months V/T/HVF      # Secondary Corneal edema right eye  - sp DSEK and tube repositioning right eye    # multifocal choroiditis OD   # macular edema OD   - Pigmentary changes in the periphery with an ERG consistent with unilateral RP vs. MFC   - Was treated w/ Ozurdex in 2022 with worsened  VA  - S/p IV dex, IVT in 2022-2023  - Per Willett, was planning to start Xipere  - Patient has been tapering PF, now on once/day  - Macular edema improved on OCT today  - Follow up Willett as scheduled    #  pseudophakia right eye.   Stable  Observe    #Cataract left eye  Not visually significant  Observe     I saw and evaluated the patient, participating in the key portions of the service.  I reviewed the resident???s note.  I agree with the resident???s findings and plan. Arville Care, MD, MS, FACS

## 2023-09-11 NOTE — Unmapped (Signed)
Patient called concerned due to speaking with Dr Marcell Anger yesterday and no vision right eye. I reviewed her chart and called the patient. Reviewed her chart with her and answered questions. Explained to her that currently she is not a good candidate for a new corneal transplant due to shallow AC. Explained that her cornea is currently the main reason for her poor vision. Patient has a better understanding of what is currently going on with her right eye. These are questions she will need to bring up with the doctors at her visits.

## 2023-10-04 ENCOUNTER — Ambulatory Visit: Admit: 2023-10-04 | Discharge: 2023-10-05 | Payer: PRIVATE HEALTH INSURANCE

## 2023-10-04 NOTE — Unmapped (Signed)
[facility]    Timeline Summary  #Multifocal choroiditis right eye   #Steroid-induced glaucoma right eye  #Cystoid macular edema right eye                 Right    Left    Systemic    Date VA IOP Inf. Plan VA IOP Inf. Plan     15-Dec-1965          DOB    2004             10/25/2011    IOL, [facility]GI         12/31/2012    IVK         03/27/2013   ERG           05/24/2017 20/80  8   20/20 12       05/29/2017 20/60    IVK 20/20        08/21/2017 20/60  9   20/25  13       09/12/2017 20/80  6  IVK 20/20 12       12/18/2017 20/40 8   20/25 14       01/17/2018 20/80   IVK 20/25 13       02/06/2018    IVK         03/07/2018 20/60  11   20/20  16       05/02/2018 HM 5  IVK 20/20 16       06/06/2018 20/70  10   20/20 12       08/14/2018 20/70  10  IVK 20/25 18       10/30/2018 20/100 9  IVK 20/20 18       01/15/2019 20/80  10  IVK 20/20  17       04/21/2019 20/80  7  OZD 20/20  20       04/28/2019 20/200 10   20/20 16       07/07/2019 20/400 9  OZD 20/20 12       09/09/2019 20/60  6  OZD 20/30  16       11/11/2019 20/80  5  OZD 20/25 15       01/13/2020 20/80 6  OZD 20/25  19       03/22/2020 20/200 7  OZD 20/25 10       05/24/2020 20/200 4  OZD 20/25  20       07/26/2020 20/200  7  OZD 20/20  20      KLW 09/28/2020 20/200 8 sl better cme OZD 8 20/20 16       11/04/2020  20/50 9 cme better 5 20/20 19       12/02/2020 20/60 3 cme worse OZD 9 20/20 16       01/20/2021  20/60  9 cme stable IVK 7   14       03/10/2021  20/60  8  CME improved   20/20  16      KLW 04/19/2021 20/70 5 CME sl worse SCT 12 20/20 12       06/16/2021 20/70 3 CME better  20/25 17       07/17/2021 20/80 5 worse SCT, 12         08/18/2021 20//70 5 better  20/20 15       10/16/2021 20/80 4 worse SCT, 12 16 20        12/25/2021 20/80 4  TC YUT  01/10/2022 20/100 3 SL worse  20/20 3       01/24/2022 20/200 4 Sl worse cIVT 20/20 16       02/21/2022 20/200 4 hazy  20/20 17       03/14/2022 20/150 4 Sl better  20/20 20       05/16/2022 20/150 6 Sl better  20/20 19       07/16/2022 09/05/2022 CF 6 K edema YUT 20/20 17       09/27/2022    DSEK + tube repos         10/09/2022     DSEK Rebub Sutures           12/17/2022 CF 6 K better  20/20 19       03/11/2023         Start MMF    04/12/2023 20/800 5 better  20/20 19   MMF    10/04/2023  HM 7 K worse  20/20 16   MMF                    Initial History of present illness:   58 year old female, works for [employer], with complex history in the right eye with 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  She has been treated here at [facility] for many years at least since 2006 and has been in the last few years treated with intraocular steroids consistently including Ozurdex every 2 months since August 2020.    She had a Baerveldt glaucoma tube placed in twenty thirteen and is followed by Dr. Cleotis Nipper for glaucoma.    The full-field ERG which was done in 2014 is somewhat difficult to interpret but appears to show both decreased voltage in rod and cone signals    A detailed questionnaire regarding medical history and review of symptoms relevant to uveitis was filled out by the patient, reviewed by the ophthalmologist and scanned into the electronic medical record.       Assessment and plan:     # multifocal choroiditis OD   # macular edema OD   Pigmentary changes in the periphery with an ERG consistent with unilateral RP vs. MFC   12/02/2020 worse at 9-week Ozurdex interval  gravity concentrated IVT lasted about 12 weeks  xipere lasts about 12 weeks  could consider in the future going back to triamcinolone and attempting centrifuge concentration to increase duration of effect, vs continue xipere vs yutiq vs immunosupression  Patient prefers Xipere rather than ozurdex , lower lid swelling  02/21/2022 concentrated triamcinolone with inflammatory reaction      Follow-up today, interval patient seen glaucoma service and notes worsening blurriness in the right eye.  Exam today shows significant corneal edema.  Patient sent message to cornea service and plan was made to move up the appointment but this is not yet been done.  Will request this again so patient is seen within a week or two.  Will plan to see the patient in a few months, hopefully with a better view to assess need for future injections but Thelma Comp is likely helping as well as immunosuppression which I think is worthwhile.        1) Fu 2-3  months, oct with edi, optos color and faf,  2) Dr. Jhonnie Garner within 1-2 weeks, ob ok (see note)            # Corneal edema right eye  S/p DSEK Rebubble + Anchoring Sutures Right Eye (10/09/22)  S/p  DSEK + tube repositioning RIGHT EYE (Ranjan/Fleischman/Pascoe)(09/27/22)   - continues PF BID, following with Ranjan/Fleischman     #  Glaucoma, s/p tube OD,   IOP acceptable  Continue follow-up with Dr. Cleotis Nipper.    #  PVD  Stable  Pt instructed to return to clinic ASAP if new floaters, flashes or curtain/shadow occur.    #  pseudophakia right eye.   Stable  Observe    #Cataract left eye  Not visually significant  Observe     # eczema  age 59 to about 16 was active   treated with systemic steroids    INTERPRETATION EXTENDED OPHTHALMOSCOPY (90D lens)  Right eye - Macula:  attached Periphery:  Attached  Left eye - Macula: Normal Periphery:Normal     Attestation:  I was present with resident during the history and exam.  I discussed the findings, assessment, and plan with the resident and agree with the findings and plan as documented in the resident's note. I reviewed any relevant imaging personally.     Foye Spurling, MD PhD           Uveitis is a serious, complex condition requiring ongoing medical care service coordination with a uveitis specialist as the focal point. [phone])

## 2023-10-07 ENCOUNTER — Ambulatory Visit
Admission: RE | Admit: 2023-10-07 | Discharge: 2023-10-07 | Disposition: A | Discharge status: Home / Self Care | Payer: 59 | Source: Ambulatory Visit | Attending: Obstetrics and Gynecology | Admitting: Obstetrics and Gynecology

## 2023-10-07 DIAGNOSIS — Z1231 Encounter for screening mammogram for malignant neoplasm of breast: Secondary | ICD-10-CM | POA: Diagnosis present

## 2023-10-09 DIAGNOSIS — H30891 Other chorioretinal inflammations, right eye: Principal | ICD-10-CM

## 2023-10-09 MED ORDER — MYCOPHENOLATE SODIUM 360 MG TABLET,DELAYED RELEASE
ORAL_TABLET | Freq: Two times a day (BID) | ORAL | 2 refills | 30.00 days
Start: 2023-10-09 — End: ?

## 2023-10-10 ENCOUNTER — Other Ambulatory Visit: Payer: Self-pay | Admitting: Obstetrics and Gynecology

## 2023-10-10 DIAGNOSIS — R928 Other abnormal and inconclusive findings on diagnostic imaging of breast: Secondary | ICD-10-CM

## 2023-10-10 MED ORDER — MYCOPHENOLATE SODIUM 360 MG TABLET,DELAYED RELEASE
ORAL_TABLET | Freq: Two times a day (BID) | ORAL | 2 refills | 30.00 days | Status: CP
Start: 2023-10-10 — End: ?
  Filled 2023-10-17: qty 180, 30d supply, fill #0

## 2023-10-10 NOTE — Unmapped (Signed)
[facility] Refill Coordination Note    Brandi [name], DOB: [birth year]  Phone: [phone] (home)       All above HIPAA information was verified with patient.         10/09/2023     1:57 PM   Specialty Rx Medication Refill Questionnaire   Which Medications would you like refilled and shipped? Mycophenolate 360   Please list all current allergies: Nickel and seafood   Have you missed any doses in the last 30 days? No   Have you had any changes to your medication(s) since your last refill? No   How many days remaining of each medication do you have at home? 17   Have you experienced any side effects in the last 30 days? No   Please enter the full address (street address, city, state, zip code) where you would like your medication(s) to be delivered to. [address] [place] [address]   Please specify on which day you would like your medication(s) to arrive. Note: if you need your medication(s) within 3 days, please call the pharmacy to schedule your order at [phone]  10/19/2023   Has your insurance changed since your last refill? No   Would you like a pharmacist to call you to discuss your medication(s)? No   Do you require a signature for your package? (Note: if we are billing Medicare Part B or your order contains a controlled substance, we will require a signature) No         Completed refill call assessment today to schedule patient's medication shipment from the [facility] [phone]).  All relevant notes have been reviewed.       Confirmed patient received a Conservation officer, historic buildings and a Surveyor, mining with first shipment. The patient will receive a drug information handout for each medication shipped and additional FDA Medication Guides as required.         REFERRAL TO PHARMACIST     Referral to the pharmacist: Not needed      [facility]     Shipping address confirmed in Epic.     Delivery Scheduled: Yes, Expected medication delivery date: 2/14.     Medication will be delivered via Next Day Courier to the prescription address in Epic WAM.    [facility] Specialty Technician

## 2023-10-11 ENCOUNTER — Encounter: Payer: Self-pay | Admitting: Obstetrics and Gynecology

## 2023-10-11 ENCOUNTER — Ambulatory Visit
Admit: 2023-10-11 | Discharge: 2023-10-12 | Payer: PRIVATE HEALTH INSURANCE | Attending: Student in an Organized Health Care Education/Training Program | Primary: Student in an Organized Health Care Education/Training Program

## 2023-10-11 DIAGNOSIS — H30891 Other chorioretinal inflammations, right eye: Principal | ICD-10-CM

## 2023-10-11 DIAGNOSIS — Z8669 Personal history of other diseases of the nervous system and sense organs: Principal | ICD-10-CM

## 2023-10-11 NOTE — Unmapped (Signed)
 Graft Failure OD  S/p DSEK Rebubble + Anchoring Sutures OD (10/09/22)  S/p DSEK + tube repositioning OD (Vaughan Garfinkle/Fleischman/Pascoe)(09/27/22)   - 10/11/23: worsening stromal edema/haze 2/2 graft failure, but not bullae. VA dropped from 20/800 to HM. Pt has no pain. Discussed again with patient I am hesitant to repeat DSAEK given the difficulties with the initial graft, the shallow AC depth, and possibility of worsening her glaucoma. May need to consider PKP given longevity of edema.   - OCT from 10/2022 demonstrates significant corrugation of the RPE OD which may be contributing to decreased BCVA (follows with Willett for hx of multifocal choroiditis). - Pt interested in contact lens to improve appearance of iris OD. Ok from cornea standpoint to wear contact lenses.        Plan:  - RTC Soleimani for second opinion OD  - c/w PF to daily

## 2023-10-14 ENCOUNTER — Ambulatory Visit
Admission: RE | Admit: 2023-10-14 | Discharge: 2023-10-14 | Disposition: A | Discharge status: Home / Self Care | Payer: 59 | Source: Ambulatory Visit | Attending: Obstetrics and Gynecology | Admitting: Obstetrics and Gynecology

## 2023-10-14 ENCOUNTER — Other Ambulatory Visit: Payer: Self-pay | Admitting: Obstetrics and Gynecology

## 2023-10-14 ENCOUNTER — Encounter: Payer: Self-pay | Admitting: Obstetrics and Gynecology

## 2023-10-14 DIAGNOSIS — R928 Other abnormal and inconclusive findings on diagnostic imaging of breast: Secondary | ICD-10-CM | POA: Insufficient documentation

## 2023-10-14 DIAGNOSIS — R921 Mammographic calcification found on diagnostic imaging of breast: Secondary | ICD-10-CM

## 2023-10-15 ENCOUNTER — Inpatient Hospital Stay: Admission: RE | Admit: 2023-10-15 | Payer: 59 | Source: Ambulatory Visit

## 2023-10-17 ENCOUNTER — Ambulatory Visit
Admission: RE | Admit: 2023-10-17 | Discharge: 2023-10-17 | Disposition: A | Discharge status: Home / Self Care | Payer: 59 | Source: Ambulatory Visit | Attending: Obstetrics and Gynecology | Admitting: Obstetrics and Gynecology

## 2023-10-17 DIAGNOSIS — R928 Other abnormal and inconclusive findings on diagnostic imaging of breast: Secondary | ICD-10-CM

## 2023-10-17 DIAGNOSIS — R921 Mammographic calcification found on diagnostic imaging of breast: Secondary | ICD-10-CM | POA: Diagnosis not present

## 2023-10-17 HISTORY — PX: BREAST BIOPSY: SHX20

## 2023-10-17 MED ORDER — LIDOCAINE 1 % OPTIME INJ - NO CHARGE
5.0000 mL | Freq: Once | INTRAMUSCULAR | Status: AC
  Administered 2023-10-17: 5 mL
  Filled 2023-10-17: qty 6

## 2023-10-17 MED ORDER — LIDOCAINE-EPINEPHRINE 1 %-1:100000 IJ SOLN
10.0000 mL | Freq: Once | INTRAMUSCULAR | Status: AC
  Administered 2023-10-17: 10 mL
  Filled 2023-10-17: qty 10

## 2023-10-18 ENCOUNTER — Ambulatory Visit: Admit: 2023-10-18 | Discharge: 2023-10-19 | Payer: PRIVATE HEALTH INSURANCE

## 2023-10-18 LAB — SURGICAL PATHOLOGY

## 2023-10-18 NOTE — Unmapped (Signed)
 Cornea Service    Subjective   Patient ID: Brandi [name] is a 58 y.o. female.      HPI    58 yo female presents for second opinion on DSEK right eye. Reports vision is stable. Denies eye pain and irritation.   - PF BID  Last edited by Angela Adam on 10/18/2023  3:56 PM.        No current outpatient medications on file. (ANTI-ULCER PREPARATIONS)       Current Outpatient Medications (Other)   Medication Sig Dispense Refill    acetaminophen (TYLENOL) 325 MG tablet Take by mouth.      ergocalciferol-1,250 mcg, 50,000 unit, (DRISDOL) 1,250 mcg (50,000 unit) capsule Take 1 capsule (1,250 mcg total) by mouth once a week. (Patient not taking: Reported on 05/27/2023)      fluticasone propionate (FLONASE) 50 mcg/actuation nasal spray into each nostril.      gabapentin (NEURONTIN) 300 MG capsule Take 1 capsule (300 mg total) by mouth Three (3) times a day. (Patient taking differently: Take 1 capsule (300 mg total) by mouth once as needed.) 90 capsule 0    loratadine (CLARITIN) 10 mg tablet Take 1 tablet (10 mg total) by mouth daily.      mycophenolate (MYFORTIC) 360 MG TbEC Take 3 tablets (1,080 mg total) by mouth two (2) times a day. 180 tablet 2    omeprazole (PRILOSEC) 20 MG capsule  (Patient not taking: Reported on 05/27/2023)      prednisoLONE acetate (PRED FORTE) 1 % ophthalmic suspension Administer 1 drop to the right eye in the morning. 10 mL 3    simethicone (GAS-X ORAL) Take 1 tablet by mouth two (2) times a day.         Objective   Base Eye Exam       Visual Acuity (Snellen - Linear)         Right Left    Dist cc HM 20/20      Correction: Glasses              Tonometry (Icare, 4:00 PM)         Right Left    Pressure 5 9              Pupils         Dark Shape React    Right No view      Left  Round Brisk              Neuro/Psych       Oriented x3: Yes    Mood/Affect: Normal                        No orders of the defined types were placed in this encounter.      Assessment/Plan:     Graft Failure right eye  S/p DSEK Rebubble + Anchoring Sutures OD (10/09/22)  S/p DSEK + tube repositioning OD (Ranjan/Fleischman/Pascoe)(09/27/22)       - 10/11/23: worsening stromal edema/haze 2/2 graft failure, but not bullae. VA dropped from 20/800 to HM. Pt has no pain. Discussed again with patient I am hesitant to repeat DSAEK given the difficulties with the initial graft, the shallow AC depth, and possibility of worsening her glaucoma. May need to consider PKP given longevity of edema.   - OCT from 10/2022 demonstrates significant corrugation of the RPE OD which may be contributing to decreased BCVA (follows with Willett for hx of multifocal choroiditis). - Pt  interested in contact lens to improve appearance of iris OD. Ok from cornea standpoint to wear contact lenses.      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 She had the opportunity to ask questions, and her questions and concerns were addressed. She was reminded to call if there is any significant change or worsening in vision, or to get an evaluation, urgently if appropriate.    Ria Bush MD,FICO

## 2023-10-21 NOTE — Progress Notes (Unsigned)
ANNUAL PREVENTATIVE CARE GYNECOLOGY  ENCOUNTER NOTE  Subjective:       Victoria Greene is a 58 y.o. G75P2001 female here for a routine annual gynecologic exam.  She has a past history significant for breast cancer, status post lumpectomy and radiation therapy.  She also has had negative BRCA gene testing.  The patient is sexually active. The patient has never taken hormone replacement therapy. Patient denies post-menopausal vaginal bleeding. Victoria Greene does wears seatbelts: yes. Does the patient participates in regular exercise? yes  Has the patient ever been transfused or tattooed?: no. The patient reports that there is not domestic violence in her life.  Current complaints: 1.  ***    Gynecologic History No LMP recorded. Patient has had a hysterectomy. Contraception: {method:5051} Last Pap: ***. Results were: {norm/abn:16337} Last mammogram: ***. 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    Family History  Problem Relation Age of Onset   Breast cancer Maternal Aunt 38   Breast cancer Maternal Aunt 46   Heart disease Father    Thyroid disease Mother    Pancreatic cancer Mother    Ovarian cancer Neg Hx    Colon cancer Neg Hx    Diabetes Neg Hx     Past Surgical History:  Procedure Laterality Date   BREAST BIOPSY Left 08/09/2020   stereo bx of calcs, x marker, path pending   BREAST BIOPSY Right 03/28/2022   stereo biopsy/ ribbon clip/  benign   BREAST BIOPSY Right 10/17/2023   Stereo bx, Coil clip, path pending   BREAST BIOPSY Right 10/17/2023   MM RT BREAST BX W LOC DEV 1ST LESION IMAGE BX SPEC STEREO GUIDE 10/17/2023 [facility]   BREAST EXCISIONAL BIOPSY Left 2010   benign   BREAST EXCISIONAL BIOPSY Right 2010   DCIS   BREAST LUMPECTOMY Right 2010   DCIS   BREAST LUMPECTOMY     CESAREAN SECTION     COLONOSCOPY WITH PROPOFOL N/A 05/26/2018   Procedure: COLONOSCOPY WITH PROPOFOL;  Surgeon: Toney Reil, MD;  Location: [facility] ENDOSCOPY;  Service: Gastroenterology;  Laterality: N/A;   COLONOSCOPY WITH PROPOFOL N/A 02/28/2022   Procedure: COLONOSCOPY WITH PROPOFOL;  Surgeon: Toney Reil, MD;  Location: [facility] ENDOSCOPY;  Service: Gastroenterology;  Laterality: N/A;   ESOPHAGOGASTRODUODENOSCOPY (EGD) WITH PROPOFOL N/A 05/26/2018   Procedure: ESOPHAGOGASTRODUODENOSCOPY (EGD) WITH PROPOFOL;  Surgeon: Toney Reil, MD;  Location:  [facility] ENDOSCOPY;  Service: Gastroenterology;  Laterality: N/A;   EYE SURGERY  20013   GLAUCOMA TUBE PLACED   LAPAROSCOPIC BILATERAL SALPINGO OOPHERECTOMY Bilateral 06/23/2018   Procedure: LAPAROSCOPIC BILATERAL SALPINGO OOPHORECTOMY;  Surgeon: Herold Harms, MD;  Location: [facility] ORS;  Service: Gynecology;  Laterality: Bilateral;   VAGINAL HYSTERECTOMY  2009  lsh    Social History   Socioeconomic History   Marital status: Divorced    Spouse name: Not on file   Number of children: Not on file   Years of education: Not on file   Highest education level: Not on file  Occupational History   Not on file  Tobacco Use   Smoking status: Never   Smokeless tobacco: Never  Vaping Use   Vaping status: Never Used  Substance and Sexual Activity   Alcohol use: Not Currently    Comment: occas   Drug use: No   Sexual activity: Yes    Birth control/protection: None  Other Topics Concern   Not on file  Social History Narrative   Not on file   Social Drivers of Health    Financial Resource Strain: Not on file  Food Insecurity: Not on file  Transportation Needs: Not on file  Physical Activity: Sufficiently Active (04/24/2018)   Exercise Vital Sign    Days of Exercise per Week: 3 days    Minutes of Exercise per Session: 60 min  Stress: Not on file  Social Connections: Not on file  Intimate Partner Violence: Not on file    Current Outpatient Medications on File Prior to Visit  Medication Sig Dispense Refill   Vitamin D, Ergocalciferol, (DRISDOL) 1.25 MG (50000 UNIT) CAPS capsule Take 1 capsule (50,000 Units total) by mouth every 7 (seven) days. 8 capsule 0   acetaminophen (TYLENOL) 500 MG tablet Take 1,000 mg by mouth every 6 (six) hours as needed for moderate pain or headache.     FIBER ADULT GUMMIES PO Take 3 each by mouth daily.     fluticasone (FLONASE) 50 MCG/ACT nasal spray Place 2 sprays into both nostrils at bedtime as needed for allergies.      loratadine (CLARITIN) 10 MG tablet Take 10 mg by mouth daily.     methylPREDNISolone (MEDROL DOSEPAK) 4 MG TBPK tablet 6 day dose pack - take as directed 21 tablet 0   Multiple Vitamin (MULTIVITAMIN) capsule Take 1 capsule by mouth daily.     No current facility-administered medications on file prior to visit.    Allergies  Allergen Reactions   Nickel Rash   Shellfish Allergy Rash   Shellfish-Derived Products Rash      Review of Systems ROS Review of Systems - General ROS: negative for - chills, fatigue, fever, hot flashes, night sweats, weight gain or weight loss Psychological ROS: negative for - anxiety, decreased libido, depression, mood swings, physical abuse or sexual abuse Ophthalmic ROS: negative for - blurry vision, eye pain or loss of vision ENT ROS: negative for - headaches, hearing change, visual changes or vocal changes Allergy and Immunology ROS: negative for - hives, itchy/watery eyes or seasonal allergies Hematological and Lymphatic ROS: negative for - bleeding problems,  bruising, swollen lymph nodes or weight loss Endocrine ROS: negative for - galactorrhea, hair pattern changes, hot flashes, malaise/lethargy, mood swings, palpitations, polydipsia/polyuria, skin changes, temperature intolerance or unexpected weight changes Breast ROS: negative for - new or changing breast lumps or nipple discharge Respiratory ROS: negative for - cough or shortness of breath Cardiovascular ROS: negative for - chest pain, irregular heartbeat, palpitations or shortness of breath Gastrointestinal ROS: no abdominal pain, change in bowel habits, or black or bloody stools Genito-Urinary ROS: no dysuria, trouble voiding, or hematuria Musculoskeletal ROS: negative for - joint pain or joint stiffness Neurological ROS: negative for - bowel and bladder control changes Dermatological ROS: negative for rash and  skin lesion changes   Objective:   There were no vitals taken for this visit. CONSTITUTIONAL: Well-developed, well-nourished female in no acute distress.  PSYCHIATRIC: Normal mood and affect. Normal behavior. Normal judgment and thought content. NEUROLGIC: Alert and oriented to person, place, and time. Normal muscle tone coordination. No cranial nerve deficit noted. HENT:  Normocephalic, atraumatic, External right and left ear normal. Oropharynx is clear and moist EYES: Conjunctivae and EOM are normal. Pupils are equal, round, and reactive to light. No scleral icterus.  NECK: Normal range of motion, supple, no masses.  Normal thyroid.  SKIN: Skin is warm and dry. No rash noted. Not diaphoretic. No erythema. No pallor. CARDIOVASCULAR: Normal heart rate noted, regular rhythm, no murmur. RESPIRATORY: Clear to auscultation bilaterally. Effort and breath sounds normal, no problems with respiration noted. BREASTS: Symmetric in size. No masses, skin changes, nipple drainage, or lymphadenopathy. ABDOMEN: Soft, normal bowel sounds, no distention noted.  No tenderness, rebound or guarding.   BLADDER: Normal PELVIC:  Bladder {:311640}  Urethra: {:311719}  Vulva: {:311722}  Vagina: {:311643}  Cervix: {:311644}  Uterus: {:311718}  Adnexa: {:311645}  RV: {Blank multiple:19196::"External Exam NormaI","No Rectal Masses","Normal Sphincter tone"}  MUSCULOSKELETAL: Normal range of motion. No tenderness.  No cyanosis, clubbing, or edema.  2+ distal pulses. LYMPHATIC: No Axillary, Supraclavicular, or Inguinal Adenopathy.   Labs: Lab Results  Component Value Date   WBC 5.4 02/23/2022   HGB 13.4 02/23/2022   HCT 42.7 02/23/2022   MCV 88.6 02/23/2022   PLT 142 (L) 02/23/2022    Lab Results  Component Value Date   CREATININE 0.76 02/23/2022   BUN 14 02/23/2022   NA 142 02/23/2022   K 3.5 02/23/2022   CL 107 02/23/2022   CO2 29 02/23/2022    Lab Results  Component Value Date   ALT 20 02/23/2022   AST 20 02/23/2022   ALKPHOS 81 02/23/2022   BILITOT 1.3 (H) 02/23/2022    Lab Results  Component Value Date   CHOL 236 (H) 09/05/2022   HDL 66 09/05/2022   LDLCALC 154 (H) 09/05/2022   TRIG 90 09/05/2022   CHOLHDL 3.6 09/05/2022    Lab Results  Component Value Date   TSH 0.855 09/05/2022    Lab Results  Component Value Date   HGBA1C 5.6 09/05/2022     Assessment:   No diagnosis found.   Plan:  Pap: {Blank multiple:19196::"Pap, Reflex if ASCUS","Pap Co Test","GC/CT NAAT","Not needed","Not done"} Mammogram: {Blank multiple:19196::"***","Ordered","Not Ordered","Not Indicated"} Colon Screening:  {Blank multiple:19196::"***","Ordered","Not Ordered","Not Indicated"} Labs: {Blank multiple:19196::"Lipid 1","FBS","TSH","Hemoglobin A1C","Vit D Level""***"} Routine preventative health maintenance measures emphasized: {Blank multiple:19196::"Exercise/Diet/Weight control","Tobacco Warnings","Alcohol/Substance use risks","Stress Management","Peer Pressure Issues","Safe Sex"} Flu vaccine status: COVID Vaccination status: Return to Clinic - 1 Year   [employer], [place] [facility]

## 2023-10-21 NOTE — Patient Instructions (Signed)
 Preventive Care 56-56 Years Old, Female Preventive care refers to lifestyle choices and visits with your health care provider that can promote health and wellness. Preventive care visits are also called wellness exams. What can I expect for my preventive care visit? Counseling Your health care provider may ask you questions about your: Medical history, including: Past medical problems. Family medical history. Pregnancy history. Current health, including: Menstrual cycle. Method of birth control. Emotional well-being. Home life and relationship well-being. Sexual activity and sexual health. Lifestyle, including: Alcohol, nicotine or tobacco, and drug use. Access to firearms. Diet, exercise, and sleep habits. Work and work Astronomer. Sunscreen use. Safety issues such as seatbelt and bike helmet use. Physical exam Your health care provider will check your: Height and weight. These may be used to calculate your BMI (body mass index). BMI is a measurement that tells if you are at a healthy weight. Waist circumference. This measures the distance around your waistline. This measurement also tells if you are at a healthy weight and may help predict your risk of certain diseases, such as type 2 diabetes and high blood pressure. Heart rate and blood pressure. Body temperature. Skin for abnormal spots. What immunizations do I need?  Vaccines are usually given at various ages, according to a schedule. Your health care provider will recommend vaccines for you based on your age, medical history, and lifestyle or other factors, such as travel or where you work. What tests do I need? Screening Your health care provider may recommend screening tests for certain conditions. This may include: Lipid and cholesterol levels. Diabetes screening. This is done by checking your blood sugar (glucose) after you have not eaten for a while (fasting). Pelvic exam and Pap test. Hepatitis B test. Hepatitis C  test. HIV (human immunodeficiency virus) test. STI (sexually transmitted infection) testing, if you are at risk. Lung cancer screening. Colorectal cancer screening. Mammogram. Talk with your health care provider about when you should start having regular mammograms. This may depend on whether you have a family history of breast cancer. BRCA-related cancer screening. This may be done if you have a family history of breast, ovarian, tubal, or peritoneal cancers. Bone density scan. This is done to screen for osteoporosis. Talk with your health care provider about your test results, treatment options, and if necessary, the need for more tests. Follow these instructions at home: Eating and drinking  Eat a diet that includes fresh fruits and vegetables, whole grains, lean protein, and low-fat dairy products. Take vitamin and mineral supplements as recommended by your health care provider. Do not drink alcohol if: Your health care provider tells you not to drink. You are pregnant, may be pregnant, or are planning to become pregnant. If you drink alcohol: Limit how much you have to 0-1 drink a day. Know how much alcohol is in your drink. In the [place] one drink equals one 12 oz bottle of beer (355 mL), one 5 oz glass of wine (148 mL), or one 1 oz glass of hard liquor (44 mL). Lifestyle Brush your teeth every morning and night with fluoride toothpaste. Floss one time each day. Exercise for at least 30 minutes 5 or more days each week. Do not use any products that contain nicotine or tobacco. These products include cigarettes, chewing tobacco, and vaping devices, such as e-cigarettes. If you need help quitting, ask your health care provider. Do not use drugs. If you are sexually active, practice safe sex. Use a condom or other form of protection to  prevent STIs. If you do not wish to become pregnant, use a form of birth control. If you plan to become pregnant, see your health care provider for a  prepregnancy visit. Take aspirin only as told by your health care provider. Make sure that you understand how much to take and what form to take. Work with your health care provider to find out whether it is safe and beneficial for you to take aspirin daily. Find healthy ways to manage stress, such as: Meditation, yoga, or listening to music. Journaling. Talking to a trusted person. Spending time with friends and family. Minimize exposure to UV radiation to reduce your risk of skin cancer. Safety Always wear your seat belt while driving or riding in a vehicle. Do not drive: If you have been drinking alcohol. Do not ride with someone who has been drinking. When you are tired or distracted. While texting. If you have been using any mind-altering substances or drugs. Wear a helmet and other protective equipment during sports activities. If you have firearms in your house, make sure you follow all gun safety procedures. Seek help if you have been physically or sexually abused. What's next? Visit your health care provider once a year for an annual wellness visit. Ask your health care provider how often you should have your eyes and teeth checked. Stay up to date on all vaccines. This information is not intended to replace advice given to you by your health care provider. Make sure you discuss any questions you have with your health care provider. Document Revised: 02/15/2021 Document Reviewed: 02/15/2021 Elsevier Patient Education  2024 Elsevier Inc. Breast Self-Awareness Breast self-awareness is knowing how your breasts look and feel. You need to: Check your breasts on a regular basis. Tell your doctor about any changes. Become familiar with the look and feel of your breasts. This can help you catch a breast problem while it is still small and can be treated. You should do breast self-exams even if you have breast implants. What you need: A mirror. A well-lit room. A pillow or other  soft object. How to do a breast self-exam Follow these steps to do a breast self-exam: Look for changes  Take off all the clothes above your waist. Stand in front of a mirror in a room with good lighting. Put your hands down at your sides. Compare your breasts in the mirror. Look for any difference between them, such as: A difference in shape. A difference in size. Wrinkles, dips, and bumps in one breast and not the other. Look at each breast for changes in the skin, such as: Redness. Scaly areas. Skin that has gotten thicker. Dimpling. Open sores (ulcers). Look for changes in your nipples, such as: Fluid coming out of a nipple. Fluid around a nipple. Bleeding. Dimpling. Redness. A nipple that looks pushed in (retracted), or that has changed position. Feel for changes Lie on your back. Feel each breast. To do this: Pick a breast to feel. Place a pillow under the shoulder closest to that breast. Put the arm closest to that breast behind your head. Feel the nipple area of that breast using the hand of your other arm. Feel the area with the pads of your three middle fingers by making small circles with your fingers. Use light, medium, and firm pressure. Continue the overlapping circles, moving downward over the breast. Keep making circles with your fingers. Stop when you feel your ribs. Start making circles with your fingers again, this time going  upward until you reach your collarbone. Then, make circles outward across your breast and into your armpit area. Squeeze your nipple. Check for discharge and lumps. Repeat these steps to check your other breast. Sit or stand in the tub or shower. With soapy water on your skin, feel each breast the same way you did when you were lying down. Write down what you find Writing down what you find can help you remember what to tell your doctor. Write down: What is normal for each breast. Any changes you find in each breast. These  include: The kind of changes you find. A tender or painful breast. Any lump you find. Write down its size and where it is. When you last had your monthly period (menstrual cycle). General tips If you are breastfeeding, the best time to check your breasts is after you feed your baby or after you use a breast pump. If you get monthly bleeding, the best time to check your breasts is 5-7 days after your monthly cycle ends. With time, you will become comfortable with the self-exam. You will also start to know if there are changes in your breasts. Contact a doctor if: You see a change in the shape or size of your breasts or nipples. You see a change in the skin of your breast or nipples, such as red or scaly skin. You have fluid coming from your nipples that is not normal. You find a new lump or thick area. You have breast pain. You have any concerns about your breast health. Summary Breast self-awareness includes looking for changes in your breasts and feeling for changes within your breasts. You should do breast self-awareness in front of a mirror in a well-lit room. If you get monthly periods (menstrual cycles), the best time to check your breasts is 5-7 days after your period ends. Tell your doctor about any changes you see in your breasts. Changes include changes in size, changes on the skin, painful or tender breasts, or fluid from your nipples that is not normal. This information is not intended to replace advice given to you by your health care provider. Make sure you discuss any questions you have with your health care provider. Document Revised: 01/25/2022 Document Reviewed: 06/22/2021 Elsevier Patient Education  2024 [employer].

## 2023-10-22 ENCOUNTER — Ambulatory Visit: Payer: 59 | Admitting: Obstetrics and Gynecology

## 2023-10-22 ENCOUNTER — Encounter: Payer: Self-pay | Admitting: Obstetrics and Gynecology

## 2023-10-22 VITALS — BP 118/84 | HR 83 | Resp 16 | Ht 61.0 in | Wt 155.1 lb

## 2023-10-22 DIAGNOSIS — Z8639 Personal history of other endocrine, nutritional and metabolic disease: Secondary | ICD-10-CM

## 2023-10-22 DIAGNOSIS — Z131 Encounter for screening for diabetes mellitus: Secondary | ICD-10-CM

## 2023-10-22 DIAGNOSIS — E663 Overweight: Secondary | ICD-10-CM

## 2023-10-22 DIAGNOSIS — Z90710 Acquired absence of both cervix and uterus: Secondary | ICD-10-CM

## 2023-10-22 DIAGNOSIS — Z01419 Encounter for gynecological examination (general) (routine) without abnormal findings: Secondary | ICD-10-CM

## 2023-10-22 DIAGNOSIS — Z13220 Encounter for screening for lipoid disorders: Secondary | ICD-10-CM

## 2023-10-22 DIAGNOSIS — Z853 Personal history of malignant neoplasm of breast: Secondary | ICD-10-CM

## 2023-10-23 ENCOUNTER — Encounter: Payer: Self-pay | Admitting: Obstetrics and Gynecology

## 2023-10-23 ENCOUNTER — Other Ambulatory Visit: Payer: Self-pay | Admitting: Obstetrics and Gynecology

## 2023-10-23 LAB — HEMOGLOBIN A1C
Est. average glucose Bld gHb Est-mCnc: 114 mg/dL
Hgb A1c MFr Bld: 5.6 % (ref 4.8–5.6)

## 2023-10-23 LAB — COMPREHENSIVE METABOLIC PANEL
ALT: 13 [IU]/L (ref 0–32)
AST: 20 [IU]/L (ref 0–40)
Albumin: 4.5 g/dL (ref 3.8–4.9)
Alkaline Phosphatase: 110 [IU]/L (ref 44–121)
BUN/Creatinine Ratio: 19 (ref 9–23)
BUN: 15 mg/dL (ref 6–24)
Bilirubin Total: 0.3 mg/dL (ref 0.0–1.2)
CO2: 28 mmol/L (ref 20–29)
Calcium: 9.3 mg/dL (ref 8.7–10.2)
Chloride: 104 mmol/L (ref 96–106)
Creatinine, Ser: 0.78 mg/dL (ref 0.57–1.00)
Globulin, Total: 2.3 g/dL (ref 1.5–4.5)
Glucose: 73 mg/dL (ref 70–99)
Potassium: 4 mmol/L (ref 3.5–5.2)
Sodium: 143 mmol/L (ref 134–144)
Total Protein: 6.8 g/dL (ref 6.0–8.5)
eGFR: 90 mL/min/{1.73_m2} (ref >59)

## 2023-10-23 LAB — CBC
Hematocrit: 41.6 % (ref 34.0–46.6)
Hemoglobin: 13 g/dL (ref 11.1–15.9)
MCH: 27.3 pg (ref 26.6–33.0)
MCHC: 31.3 g/dL — ABNORMAL LOW (ref 31.5–35.7)
MCV: 87 fL (ref 79–97)
Platelets: 158 10*3/uL (ref 150–450)
RBC: 4.76 x10E6/uL (ref 3.77–5.28)
RDW: 13.9 % (ref 11.7–15.4)
WBC: 6.1 10*3/uL (ref 3.4–10.8)

## 2023-10-23 LAB — LIPID PANEL
Chol/HDL Ratio: 3.1 {ratio} (ref 0.0–4.4)
Cholesterol, Total: 188 mg/dL (ref 100–199)
HDL: 60 mg/dL (ref >39)
LDL Chol Calc (NIH): 114 mg/dL — ABNORMAL HIGH (ref 0–99)
Triglycerides: 78 mg/dL (ref 0–149)
VLDL Cholesterol Cal: 14 mg/dL (ref 5–40)

## 2023-10-23 LAB — VITAMIN D 25 HYDROXY (VIT D DEFICIENCY, FRACTURES): Vit D, 25-Hydroxy: 22.6 ng/mL — ABNORMAL LOW (ref 30.0–100.0)

## 2023-10-23 LAB — TSH: TSH: 2.15 u[IU]/mL (ref 0.450–4.500)

## 2023-10-23 MED ORDER — VITAMIN D (ERGOCALCIFEROL) 1.25 MG (50000 UNIT) PO CAPS: 50000.0000 [IU] | ORAL_CAPSULE | ORAL | 1 refills | Status: AC

## 2023-11-12 NOTE — Unmapped (Signed)
 [facility] Pharm[facility] Refill Coordination Note    Brandi [name], DOB: [birth year]  Phone: [phone] (home)       All above HIPAA information was verified with patient.         11/11/2023     4:51 PM   Specialty Rx Medication Refill Questionnaire   Which Medications would you like refilled and shipped? Mycophenolate 360   Please list all current allergies: Seafood a d Nickel   Have you missed any doses in the last 30 days? No   Have you had any changes to your medication(s) since your last refill? No   How many days remaining of each medication do you have at home? 22 days.Marland Kitcheni received two bottles this time   Have you experienced any side effects in the last 30 days? Yes   Please list the medication side effects below. (A pharmacist will reach out to you shortly to discuss your side effects. Note: your medications will not be shipped until we reach out to you). None   Please enter the full address (street address, city, state, zip code) where you would like your medication(s) to be delivered to. [address] [place] [address]   Please specify on which day you would like your medication(s) to arrive. Note: if you need your medication(s) within 3 days, please call the pharmacy to schedule your order at [phone]  12/02/2023   Has your insurance changed since your last refill? No   Would you like a pharmacist to call you to discuss your medication(s)? No   Do you require a signature for your package? (Note: if we are billing Medicare Part B or your order contains a controlled substance, we will require a signature) No   Additional Comments: None         Completed refill call assessment today to schedule patient's medication shipment from the [facility] [phone]).  All relevant notes have been reviewed.       Confirmed patient received a Conservation officer, historic buildings and a Surveyor, mining with first shipment. The patient will receive a drug information handout for each medication shipped and additional FDA Medication Guides as required.         REFERRAL TO PHARMACIST     Referral to the pharmacist: Yes - new side effects are reported: Patient did not specify. Refills were scheduled and concern routed (high priority) to pharmacist for evaluation.      SHIPPING     Shipping address confirmed in Epic.     Delivery Scheduled: Yes, Expected medication delivery date: 12/02/23.     Medication will be delivered via Same Day Courier to the prescription address in Epic WAM.    April Holding   [facility] Specialty Technician

## 2023-11-13 NOTE — Unmapped (Signed)
 This pharmacist was notified by a technician that this patient has reported they are experiencing the following side effects n/a.. I have reached out to the patient and  patient had side effects previously but they decreased the dose and feeling better.       Approximate time spent: 0-5 minutes    Julianne Rice, PharmD, Clinical Specialty Pharmacist  [facility]

## 2023-11-18 ENCOUNTER — Encounter
Admit: 2023-11-18 | Discharge: 2023-11-19 | Payer: PRIVATE HEALTH INSURANCE | Attending: Student in an Organized Health Care Education/Training Program | Primary: Student in an Organized Health Care Education/Training Program

## 2023-11-18 DIAGNOSIS — H30899 Other chorioretinal inflammations, unspecified eye: Principal | ICD-10-CM

## 2023-11-18 DIAGNOSIS — H30891 Other chorioretinal inflammations, right eye: Principal | ICD-10-CM

## 2023-11-18 DIAGNOSIS — Z5181 Encounter for therapeutic drug level monitoring: Principal | ICD-10-CM

## 2023-11-18 MED ORDER — MYCOPHENOLATE SODIUM 360 MG TABLET,DELAYED RELEASE
ORAL_TABLET | Freq: Two times a day (BID) | ORAL | 1 refills | 90.00 days | Status: CP
Start: 2023-11-18 — End: ?
  Filled 2023-12-16: qty 120, 30d supply, fill #0

## 2023-11-18 NOTE — Unmapped (Signed)
 Virtual visit for today is good, thanks,We would need to reschedule your appointment to either a virtual visit today or an in person appointment next Monday  at 4 pm. Please let me know what you would prefer to do.      Thanks  Antionette Fairy, RN

## 2023-11-18 NOTE — Unmapped (Signed)
 Assessment/Plan:   Brandi [name] is a 58 y.o. female with PMH of macular edema, glaucoma, cataract of left eye, seasonal allergies, multifocal choroiditis of right eye here for follow up.    # Multifocal Choroiditis, right eye   Patient has had a longstanding history of multifocal choroiditis of the right eye and follows with Dr. Beckey Downing. She has had multiple rounds of intra ocular steroids, most recently [place]. She has had an extensive workup in the past for infectious and autoimmune causes which were negative. There is no evidence of systemic autoimmune cause of her multifocal choroiditis. She has no evidence of ANCA vasculitis, sarcoidosis, Behcet's, seronegative spondyloarthropathy or SLE. 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 Switched to Myfortic due to GI side effects which she is tolerating.   Plan:  - Continue Myfortic 720 mg bid   - Continue close follow up with Dr. Beckey Downing   - Obtain medication monitoring labs, ordered as future     HCM:  - PCV 20: June 2024   - Flu and COVID booster Sept 2024  - Shingles: 09/2022, 11/2022      The patient reports they are currently: at home. I spent 20 minutes on the real-time audio and video visit with the patient on the date of service. I spent an additional 10 minutes on pre- and post-visit activities on the date of service.     The patient was located and I was located within 250 yards of a hospital based location during the real-time audio and video visit. The patient was physically located in [place] or a state in which I am permitted to provide care. The patient and/or parent/guardian understood that s/he may incur co-pays and cost sharing, and agreed to the telemedicine visit. The visit was reasonable and appropriate under the circumstances given the patient's presentation at the time.    The patient and/or parent/guardian has been advised of the potential risks and limitations of this mode of treatment (including, but not limited to, the absence of in-person examination) and has agreed to be treated using telemedicine. The patient's/patient's family's questions regarding telemedicine have been answered.    If the visit was completed in an ambulatory setting, the patient and/or parent/guardian has also been advised to contact their provider???s office for worsening conditions, and seek emergency medical treatment and/or call 911 if the patient deems either necessary.      Diagnoses and all orders for this visit:    Multifocal choroiditis, unspecified laterality    Multifocal choroiditis of right eye  -     mycophenolate (MYFORTIC) 360 MG TbEC; Take 2 tablets (720 mg total) by mouth two (2) times a day.    Medication monitoring encounter  -     CBC; Future  -     Comprehensive Metabolic Panel; Future  -     CBC  -     Comprehensive Metabolic Panel          Return in about 6 months (around 05/20/2024).    Patient discussed with attending physician, Dr. Olean Ree    I appreciate the opportunity to participate and collaborate in the care of this patient.    Burgess Amor, MD  PGY5 Rheumatology Fellow     History of Present Illness:     Primary Care Provider: Ammie Dalton, MD    HPI:  Brandi [name] is a 58 y.o.  female with PMH of  macular edema, glaucoma, cataract of left eye, seasonal allergies, multifocal choroiditis of right eye here for follow up.     Background:   Patient reports she was initially diagnosed with multifocal choroiditis in 2011.  She has had an extensive workup including infectious and autoimmune testing in the past which was negative. She has been treated at [facility] with intra-articular steroids. She has developed glaucoma after multiple years of steroid use and is s/p surgery for glaucoma.  She follows with Dr. Beckey Downing for multifocal chorditis.  Previously received Xipere steroid injection and received Yutiq steroid injection prior to her glaucoma surgery.  Dr. Beckey Downing referred her to rheumatology for consideration of starting immunosuppression.  At her initial visit with rheumatology in June 2024 she was started on Cellcept which was changed to Myfortic due to GI side effects.    Last seen: September 2024    Interval history:   She recently was at birthday party last week and got sick. She reports she was feeling tired for several days and ultimately was diagnosed with the Flu. Tested positive for flu, she is not on Tamiflu. She was given a prescription for promethazine and doxycycline. She reports her cough has improved. She denies current fever. Holding Myfortic currently, previously on Myfortic 720 mg bid. 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 She continues to pred forte eye drops once a day.     Balance of 10 systems was reviewed and is negative except for that mentioned in the HPI.    Review of records: I have reviewed labs/images/clinic notes per the computerized medical record.       Allergies:  Nickel and Shellfish containing products    Medications:     Current Outpatient Medications:     acetaminophen (TYLENOL) 325 MG tablet, Take by mouth., Disp: , Rfl:     ergocalciferol-1,250 mcg, 50,000 unit, (DRISDOL) 1,250 mcg (50,000 unit) capsule, Take 1 capsule (1,250 mcg total) by mouth once a week. (Patient not taking: Reported on 05/27/2023), Disp: , Rfl:     fluticasone propionate (FLONASE) 50 mcg/actuation nasal spray, into each nostril., Disp: , Rfl:     gabapentin (NEURONTIN) 300 MG capsule, Take 1 capsule (300 mg total) by mouth Three (3) times a day. (Patient taking differently: Take 1 capsule (300 mg total) by mouth once as needed.), Disp: 90 capsule, Rfl: 0    loratadine (CLARITIN) 10 mg tablet, Take 1 tablet (10 mg total) by mouth daily., Disp: , Rfl:     mycophenolate (MYFORTIC) 360 MG TbEC, Take 2 tablets (720 mg total) by mouth two (2) times a day., Disp: 360 tablet, Rfl: 1    omeprazole (PRILOSEC) 20 MG capsule, , Disp: , Rfl:     prednisoLONE acetate (PRED FORTE) 1 % ophthalmic suspension, Administer 1 drop to the right eye in the morning., Disp: 10 mL, Rfl: 3    simethicone (GAS-X ORAL), Take 1 tablet by mouth two (2) times a day., Disp: , Rfl:     Current Facility-Administered Medications:     fluocinolone (YUTIQ) intravitreal implant 0.18 mg, 0.18 mg, Intravitreal, Once, Willett, Kirstie Peri, MD    Facility-Administered Medications Ordered in Other Visits:     [EXPIRED] triamcinolone acetonide (PF) (XIPERE) injection 4 mg, 4 mg, suprachoroidal, , Beckey Downing, Kirstie Peri, MD, 4 mg at 10/16/21 1610    Medical History:  Past Medical History:   Diagnosis Date    Allergic rhinitis     Amblyopia  Na    After glaucoma surgery    Anisocoria     Breast cancer (CMS-HCC)     Cataract     Left eye    CME (cystoid macular edema)     Dry eyes Na    Eczema     Onset age 22 to about age 50 was treated w/systemic steroids    GERD (gastroesophageal reflux disease)     Headache     Multifocal choroiditis of right eye     Pseudophakia, right eye     PVD (posterior vitreous detachment), bilateral     Steroid induced glaucoma, right eye        Surgical History:  Past Surgical History:   Procedure Laterality Date    BREAST LUMPECTOMY      CATARACT EXTRACTION Right     CESAREAN SECTION      x2    EYE SURGERY      GLAUCOMA SURGERY Right     HYSTERECTOMY      LAPAROSCOPIC SUPRACERVICAL HYSTERECTOMY      PR CORNEAL TRANSPLANT,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    Procedure: DSEK KERATOPLASTY (CORNEAL TRANSPLANT); ENDOTHELIAL;  Surgeon: Garner Gavel, MD;  [facility];  Service: Ophthalmology    PR INJECTION,ANT CHAMBER,EYE,AIR/LIQUID Right 10/09/2022    Procedure: INJECTION, ANTERIOR CHAMBER OF EYE (SEP PROCEDURE); AIR OR LIQUID;  Surgeon: Garner Gavel, MD;  [facility] OR [facility];  Service: Ophthalmology    PR REVJ AQUEOUS SHUNT EXTRAOCULAR RESERVOIR W/GRAFT Right 09/27/2022    Procedure: REVIS AQUEOUS SHUNT-EXTRAOCULAR RESERVOIR;  Surgeon: Arville Care, MD;  Location: [facility] OR [facility];  Service: Ophthalmology       Social History:  Social History     Tobacco Use    Smoking status: Never    Smokeless tobacco: Never   Vaping Use    Vaping status: Never Used   Substance Use Topics    Alcohol use: No    Drug use: Never       Family History:  Family History   Problem Relation Age of Onset    Cancer Mother         PANCREATIC    Glaucoma Mother     Heart disease Father     No Known Problems Sister     No Known Problems Brother     Glaucoma Maternal Aunt     No Known Problems Maternal Uncle     No Known Problems Paternal Aunt     No Known Problems Paternal Uncle     Glaucoma Maternal Grandmother     No Known Problems Maternal Grandfather     No Known Problems Paternal Grandmother     No Known Problems Paternal Grandfather     No Known Problems Other     Amblyopia Neg Hx     Blindness Neg Hx     Cataracts Neg Hx     Macular degeneration Neg Hx     Retinal detachment Neg Hx     Strabismus Neg Hx     Diabetes Neg Hx     Hypertension Neg Hx     Stroke Neg Hx     Thyroid disease Neg Hx            Objective   There were no vitals filed for this visit.      Physical Exam:  General: Well-appearing.   Speaking in full sentences  Rest of exam limited as this was a telemedicine appointment  Test Results  No results found for: ANA, PAT1, ANATITER1, PAT2, ANATITER2, PAT3, ANATITER3, PAT4, ANATITER4    No results found for: C3, C4, CKTOTAL, CRP, ESR, LDH, DSDNAAB, RF    No results found for: CREATUR, PROTEINUR, PCRATIOUR    No results found for: EJO1, ERNP, ESCL, ESMA, ESSA, ESSB    Lab Results   Component Value Date    HBSAG Nonreactive 02/25/2023    QFTTBGOLD Negative 02/25/2023    HEPCAB Nonreactive 02/25/2023       Lab Results   Component Value Date    WBC 5.7 06/28/2023       Lab Results   Component Value Date    ANCAIFA Negative 02/25/2023    MPO Negative 02/25/2023    MPOQT 3.0 02/25/2023

## 2023-11-18 NOTE — Unmapped (Signed)
 Dear Colin Rhein    It was nice to see you!     Today in clinic we discussed the following:     Restart Myfortic once you feel better from the flu  Follow up with Dr. Beckey Downing  Please get blood work at [facility]  Follow up in 6 months     Please know you can reach out to me if you have any issues getting a medication (availability or cost) that I prescribe.    If you have non-urgent questions, the best way to contact us is to send a MyChart message by visiting [email or web address].  You can also use MyChart to request refills and access test results.  I will do my best to respond within 2 business days, however occasionally it may take longer. If you have immediate concerns, please contact our clinic by phone [phone].     Have a nice day!     Burgess Amor, MD  [facility]  [address]  [place], [place] [address]  Phone: [phone]  Fax: [phone]

## 2023-11-19 NOTE — Unmapped (Signed)
 [facility] Pharmacist has reviewed a new prescription for Myfortic that indicates a dose decrease.  Patient was counseled on this dosage change by MD- see epic note from 11/18/23.  Next refill call date adjusted if necessary.

## 2023-11-19 NOTE — Unmapped (Signed)
 Clinical Assessment Needed For: Dose Change  Medication: Myfortic 360mg  EC tablet  Last Fill Date/Day Supply: 10/17/2023 / 30 days  Copay $0  Was previous dose already scheduled to fill: No    Notes to Pharmacist: N/A

## 2023-11-23 LAB — CBC
HEMATOCRIT: 39.1 % (ref 34.0–46.6)
HEMOGLOBIN: 12.5 g/dL (ref 11.1–15.9)
MEAN CORPUSCULAR HEMOGLOBIN CONC: 32 g/dL (ref 31.5–35.7)
MEAN CORPUSCULAR HEMOGLOBIN: 27.5 pg (ref 26.6–33.0)
MEAN CORPUSCULAR VOLUME: 86 fL (ref 79–97)
PLATELET COUNT: 227 10*3/uL (ref 150–450)
RED BLOOD CELL COUNT: 4.55 x10E6/uL (ref 3.77–5.28)
RED CELL DISTRIBUTION WIDTH: 13.9 % (ref 11.7–15.4)
WHITE BLOOD CELL COUNT: 8.2 10*3/uL (ref 3.4–10.8)

## 2023-11-23 LAB — COMPREHENSIVE METABOLIC PANEL
ALBUMIN: 4.1 g/dL (ref 3.8–4.9)
ALKALINE PHOSPHATASE: 85 IU/L (ref 44–121)
ALT (SGPT): 10 IU/L (ref 0–32)
AST (SGOT): 17 IU/L (ref 0–40)
BILIRUBIN TOTAL (MG/DL) IN SER/PLAS: 0.5 mg/dL (ref 0.0–1.2)
BLOOD UREA NITROGEN: 11 mg/dL (ref 6–24)
BUN / CREAT RATIO: 15 (ref 9–23)
CALCIUM: 9.6 mg/dL (ref 8.7–10.2)
CHLORIDE: 105 mmol/L (ref 96–106)
CO2: 25 mmol/L (ref 20–29)
CREATININE: 0.74 mg/dL (ref 0.57–1.00)
EGFR: 95 mL/min/{1.73_m2}
GLOBULIN, TOTAL: 2.7 g/dL (ref 1.5–4.5)
GLUCOSE: 92 mg/dL (ref 70–99)
POTASSIUM: 4.4 mmol/L (ref 3.5–5.2)
SODIUM: 144 mmol/L (ref 134–144)
TOTAL PROTEIN: 6.8 g/dL (ref 6.0–8.5)

## 2023-12-04 ENCOUNTER — Encounter: Admit: 2023-12-04 | Discharge: 2023-12-04 | Attending: Anesthesiology | Primary: Anesthesiology

## 2023-12-04 ENCOUNTER — Inpatient Hospital Stay: Admit: 2023-12-04 | Discharge: 2023-12-04

## 2023-12-04 MED ORDER — ACETAZOLAMIDE ER 500 MG CAPSULE,EXTENDED RELEASE
ORAL_CAPSULE | Freq: Two times a day (BID) | ORAL | 0 refills | 30.00000 days | Status: CP
Start: 2023-12-04 — End: 2024-01-03

## 2023-12-04 MED ORDER — MOXIFLOXACIN 0.5 % EYE DROPS
Freq: Four times a day (QID) | OPHTHALMIC | 0 refills | 7.00000 days | Status: CP
Start: 2023-12-04 — End: 2023-12-11

## 2023-12-04 MED ORDER — PREDNISOLONE ACETATE 1 % EYE DROPS,SUSPENSION
OPHTHALMIC | 1 refills | 25.00000 days | Status: CP
Start: 2023-12-04 — End: ?

## 2023-12-04 MED ORDER — ATROPINE 1 % EYE DROPS
Freq: Three times a day (TID) | OPHTHALMIC | 3 refills | 94.00000 days | Status: CP
Start: 2023-12-04 — End: 2024-12-03

## 2023-12-04 MED ADMIN — phenylephrine 1 mg/10 mL (100 mcg/mL) injection Syrg: INTRAVENOUS | @ 13:00:00 | Stop: 2023-12-04

## 2023-12-04 MED ADMIN — fentaNYL (PF) (SUBLIMAZE) injection: INTRAVENOUS | @ 13:00:00 | Stop: 2023-12-04

## 2023-12-04 MED ADMIN — dexAMETHasone (DECADRON) 4 mg/mL injection: INTRAVENOUS | @ 13:00:00 | Stop: 2023-12-04

## 2023-12-04 MED ADMIN — Propofol (DIPRIVAN) injection: INTRAVENOUS | @ 13:00:00 | Stop: 2023-12-04

## 2023-12-04 MED ADMIN — ondansetron (ZOFRAN) injection: INTRAVENOUS | @ 13:00:00 | Stop: 2023-12-04

## 2023-12-04 MED ADMIN — erythromycin (ROMYCIN) 5 mg/gram (0.5 %) ophthalmic ointment: OPHTHALMIC | @ 13:00:00 | Stop: 2023-12-04

## 2023-12-04 MED ADMIN — fentaNYL (PF) (SUBLIMAZE) injection: INTRAVENOUS | @ 14:00:00 | Stop: 2023-12-04

## 2023-12-04 MED ADMIN — balanced salt irrigation solution (BSS) ophthalmic irrigation solution: OPHTHALMIC | @ 13:00:00 | Stop: 2023-12-04

## 2023-12-04 MED ADMIN — lidocaine (PF) (XYLOCAINE-MPF) 20 mg/mL (2 %) injection: INTRAVENOUS | @ 13:00:00 | Stop: 2023-12-04

## 2023-12-04 MED ADMIN — lactated Ringers infusion: 10 mL/h | INTRAVENOUS | @ 12:00:00 | Stop: 2023-12-04

## 2023-12-04 MED ADMIN — ePHEDrine (PF) 25 mg/5 mL (5 mg/mL) in 0.9% sodium chloride syringe Syrg: INTRAVENOUS | @ 13:00:00 | Stop: 2023-12-04

## 2023-12-04 MED ADMIN — sodium hyaluronate (HEALON,PROVISC) intraocular injection: OPHTHALMIC | @ 13:00:00 | Stop: 2023-12-04

## 2023-12-04 MED ADMIN — ceFAZolin (ANCEF), dexAMETHasone (DECADRON) ophthalmic injection: SUBCONJUNCTIVAL | @ 13:00:00 | Stop: 2023-12-04

## 2023-12-04 NOTE — Unmapped (Signed)
 Ophthalmology Pre-Operative H&P      History of Present Illness:  58 y.o. female here today for:     Right - KERATOPLASTY (CORNEAL TRANSPLANT); PENETRATING (EXCEPT IN APHAKIA OR PSEUDOPHAKIA)    Allergies:  Nickel and Shellfish containing products    Medications:   Current Facility-Administered Medications   Medication Dose Route Frequency Provider Last Rate Last Admin    lactated Ringers infusion  10 mL/hr Intravenous Continuous Kristine Garbe, MD 10 mL/hr at 12/04/23 0809 Continued by Anesthesia at 12/04/23 0809    lidocaine (PF) (XYLOCAINE-MPF) 10 mg/mL (1 %) injection 1 mL  1 mL Intradermal Once PRN Kristine Garbe, MD         Facility-Administered Medications Ordered in Other Encounters   Medication Dose Route Frequency Provider Last Rate Last Admin    [EXPIRED] triamcinolone acetonide (PF) (XIPERE) injection 4 mg  4 mg suprachoroidal  Britt Bottom, MD   4 mg at 10/16/21 1610     Facility-Administered Medications Prior to Admission   Medication Dose Route Frequency Provider Last Rate Last Admin    fluocinolone (YUTIQ) intravitreal implant 0.18 mg  0.18 mg Intravitreal Once Britt Bottom, MD         Medications Prior to Admission   Medication Sig Dispense Refill Last Dose/Taking    mycophenolate (MYFORTIC) 360 MG TbEC Take 2 tablets (720 mg total) by mouth two (2) times a day. 360 tablet 1 12/04/2023 Morning    acetaminophen (TYLENOL) 325 MG tablet Take by mouth.       ergocalciferol-1,250 mcg, 50,000 unit, (DRISDOL) 1,250 mcg (50,000 unit) capsule Take 1 capsule (1,250 mcg total) by mouth once a week. (Patient not taking: Reported on 05/27/2023)       fluticasone propionate (FLONASE) 50 mcg/actuation nasal spray into each nostril.       gabapentin (NEURONTIN) 300 MG capsule Take 1 capsule (300 mg total) by mouth Three (3) times a day. (Patient taking differently: Take 1 capsule (300 mg total) by mouth once as needed.) 90 capsule 0     loratadine (CLARITIN) 10 mg tablet Take 1 tablet (10 mg total) by mouth daily.       omeprazole (PRILOSEC) 20 MG capsule  (Patient not taking: Reported on 05/27/2023)       prednisoLONE acetate (PRED FORTE) 1 % ophthalmic suspension Administer 1 drop to the right eye in the morning. 10 mL 3     simethicone (GAS-X ORAL) Take 1 tablet by mouth two (2) times a day.          Past Medical History:   Diagnosis Date    Allergic rhinitis     Amblyopia Na    After glaucoma surgery    Anisocoria     Breast cancer     Cataract     Left eye    CME (cystoid macular edema)     Dry eyes Na    Eczema     Onset age 36 to about age 66 was treated w/systemic steroids    GERD (gastroesophageal reflux disease)     Headache     Multifocal choroiditis of right eye     Pseudophakia, right eye     PVD (posterior vitreous detachment), bilateral     Steroid induced glaucoma, right eye        Past Surgical History:   Procedure Laterality Date    BREAST LUMPECTOMY      CATARACT EXTRACTION Right     CESAREAN SECTION  x2    EYE SURGERY      GLAUCOMA SURGERY Right     HYSTERECTOMY      LAPAROSCOPIC SUPRACERVICAL HYSTERECTOMY      PR CORNEAL TRANSPLANT,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    Procedure: DSEK KERATOPLASTY (CORNEAL TRANSPLANT); ENDOTHELIAL;  Surgeon: Garner Gavel, MD;  [facility];  Service: Ophthalmology    PR INJECTION,ANT CHAMBER,EYE,AIR/LIQUID Right 10/09/2022    Procedure: INJECTION, ANTERIOR CHAMBER OF EYE (SEP PROCEDURE); AIR OR LIQUID;  Surgeon: Garner Gavel, MD;  Location[facility] OR [facility];  Service: Ophthalmology    PR REVJ AQUEOUS SHUNT EXTRAOCULAR RESERVOIR W/GRAFT Right 09/27/2022    Procedure: REVIS AQUEOUS SHUNT-EXTRAOCULAR RESERVOIR;  Surgeon: Arville Care, MD;  [facility]: [facility];  Service: Ophthalmology       Family History:  family history includes Cancer in her mother; Glaucoma in her maternal aunt, maternal grandmother, and mother; Heart disease in her father; No Known Problems in her brother, maternal grandfather, maternal uncle, paternal aunt, paternal grandfather, paternal grandmother, paternal uncle, sister, and another family member.  There is no reported history of clotting disorders or anesthesia problems.    Social History:  Tobacco use:   reports that she has never smoked. She has never used smokeless tobacco.  Alcohol use:   reports no history of alcohol use.  Drug use:  reports no history of drug use.    Review of Systems:    10 point review of systems is negative other than HPI.    Objective:     Vital Signs:  Temp:  [36.6 ??C (97.9 ??F)] 36.6 ??C (97.9 ??F)  Heart Rate:  [70] 70  Resp:  [18] 18  BP: (149)/(86) 149/86  SpO2:  [98 %] 98 %  BMI (Calculated):  [25.71] 25.71  No intake/output data recorded.    Physical Exam:  General: NAD.  Neuro/Psych: Alerted and Oriented x 3.  Ambulatory.  Psych: behavior wnl.  HEENT: normocephalic.  EYES: anicteric  RESP: normal work of breathing.  CV: normal heart rate.  SKIN: warm and dry.    Please page the ophthalmology consults resident or resident on call with questions.  [facility], [address] ([address], intersection with [address]) can be reached at [phone].    Cristine Polio, MD   [facility] Ophthalmology Department, PGY-4

## 2023-12-04 NOTE — Unmapped (Signed)
 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 Item Serial No. Manufacturer Lot No. LRB No. Used Action   \=[id number]=>[id number]\ Tissue Implant CORNEAL ALLOGRAFT PKP [id number]  [employer] [id number] Right 1 Implanted         DATE OF PROCEDURE: 12/04/2023    SURGEON: Surgeons and Role:     * Ria Bush, MD - Primary     * Harmel, Irene Pap, MD - Resident - Assisting    ANESTHESIA: General    COMPLICATIONS: None    SPECIMEN:  1) Corneal button for pathology  2) Donor corneoscleral rim for culture    ESTIMATED BLOOD LOSS: <1 mL    INDICATIONS FOR PROCEDURE: Brandi [name] is a 58 y.o. patient who presented to clinic with . After discussion of risks, benefits, and alternatives of corneal transplant, the patient agreed to proceed, and informed consent was obtained.    DESCRIPTION OF PROCEDURE: The patient was taken back to the operating room where cardiopulmonary monitoring and light sedation were established by General Anesthesia.   The patient was prepped and draped in the standard sterile fashion for ophthalmic surgery. A Schott speculum was placed between the fissures of the operative eye. The cornea was inspected and measured. An 8-mm trephination with an 8.5-mm graft was measured to be appropriate. The center of the host cornea was identified and marked, and an RK marker was used to create peripheral guide markings. Attention was then directed to the back table, where the donor cornea was retrieved from the tissue culture solution and a 8.5 mm central button was then punched out of the donor cornea. This was covered with tissue culture solution and the copper cover was placed over the cutting block.  Attention was then directed back to the patient. A central site for trephination was identified and a 8 mm trephine was then applied to the eye. Once this was well centered, suction was applied to the trephine and the blade was rotated to a depth of 500 um. The suction was released and the wound was inspected and found to be of adequate depth throughout. A supersharp blade was used to carefully enter the anterior chamber. Viscoelastic was injected into the anterior chamber. Unfortunately, there was no any AC present infact, we had merged 360 PAS. By means of left and right corneal scissors, the diseased corneal button and DSAEK lenticule was excised and sent for pathology.As we mentioned there was 360 PAS PAS merged to the cornea; tried to remove the PAS, but was not possible, a surgical superior PI was made.  Provisc was then layered over the IOL surface.    The donor cornea was retrieved from the tissue culture solution and placed within the recipient bed. This was secured into position by means of 16 interrupted #10-0 nylon sutures. The loose tags were tied and cut flush at the knots and the knots were pulled and buried within the recipient bed. Prior to insertion of the last two sutures, BSS was used to irrigate the anterior chamber and flush the remaining viscoelastic from the anterior chamber. The anterior chamber was inflated with balanced salt solution  until an adequate pressure was obtained. The wound was inspected and found to be watertight throughout. Subconjunctival Ancef and Decadron were injected. Ofloxacin/prednisolone solutions followed by Tobradex ointment were applied to the operative eye. A patch and shield were placed over the eye. The patient left the operating room in stable condition.      Ria Bush, MD

## 2023-12-05 ENCOUNTER — Ambulatory Visit: Admit: 2023-12-05 | Discharge: 2023-12-06

## 2023-12-05 DIAGNOSIS — Z9889 Other specified postprocedural states: Principal | ICD-10-CM

## 2023-12-05 NOTE — Unmapped (Addendum)
 This patient was seen with a resident/fellow. I am in agreement with the plan above and have made any changes as necessary.    Ria Bush MD,[facility]  Cornea and Refractive Surgery     Post op day 1 PKP right eye     AC flat but wounds seidel negative   IOP 7   Air optix BCL placed at slit lamp     Plan   OK to not start diamox   Post operative instructions reviewed  Start moxifloxacin (tan top drop) four times a day right eye   Start prednisolone acetate (PRED FORTE) 1% (white or pink top drop) every two hours right eye   Continue atropine three times a day   RTC POW1 4/8 [facility] for POW1

## 2023-12-10 ENCOUNTER — Ambulatory Visit: Admit: 2023-12-10 | Discharge: 2023-12-11

## 2023-12-10 DIAGNOSIS — Z9889 Other specified postprocedural states: Principal | ICD-10-CM

## 2023-12-10 NOTE — Unmapped (Signed)
 POW1 s/p PKP, Right eye     AC still shallow. IOP 6. Graft clear with 2+ central d-folds     Plan   -Post operative instructions reviewed  -Continue moxifloxacin QID, right eye   -Continue prednisolone acetate (PRED FORTE) 1% (white or pink top drop) every two hours right eye   -Can decrease atropine to BID     RTC 2-3 weeks [facility] clinic     Denny Flack, MD   PGY-4 [facility] Ophthalmology

## 2023-12-11 ENCOUNTER — Ambulatory Visit: Admit: 2023-12-11 | Discharge: 2023-12-12 | Payer: PRIVATE HEALTH INSURANCE

## 2023-12-11 NOTE — Unmapped (Signed)
 [facility]    Timeline Summary  #Multifocal choroiditis right eye   #Steroid-induced glaucoma right eye  #Cystoid macular edema right eye                 Right    Left    Systemic    Date VA IOP Inf. Plan VA IOP Inf. Plan     1966/03/04          DOB    2006             10/25/2011    IOL, BGI         12/31/2012    IVK         03/27/2013   ERG           05/24/2017 20/80  8   20/20 12       05/29/2017 20/60    IVK 20/20        08/21/2017 20/60  9   20/25  13       09/12/2017 20/80  6  IVK 20/20 12       12/18/2017 20/40 8   20/25 14       01/17/2018 20/80   IVK 20/25 13       02/06/2018    IVK         03/07/2018 20/60  11   20/20  16       05/02/2018 HM 5  IVK 20/20 16       06/06/2018 20/70  10   20/20 12       08/14/2018 20/70  10  IVK 20/25 18       10/30/2018 20/100 9  IVK 20/20 18       01/15/2019 20/80  10  IVK 20/20  17       04/21/2019 20/80  7  OZD 20/20  20       04/28/2019 20/200 10   20/20 16       07/07/2019 20/400 9  OZD 20/20 12       09/09/2019 20/60  6  OZD 20/30  16       11/11/2019 20/80  5  OZD 20/25 15       01/13/2020 20/80 6  OZD 20/25  19       03/22/2020 20/200 7  OZD 20/25 10       05/24/2020 20/200 4  OZD 20/25  20       07/26/2020 20/200  7  OZD 20/20  20      KLW 09/28/2020 20/200 8 sl better cme OZD 8 20/20 16       11/04/2020  20/50 9 cme better 5 20/20 19       12/02/2020 20/60 3 cme worse OZD 9 20/20 16       01/20/2021  20/60  9 cme stable IVK 7   14       03/10/2021  20/60  8  CME improved   20/20  16      KLW 04/19/2021 20/70 5 CME sl worse SCT 12 20/20 12       06/16/2021 20/70 3 CME better  20/25 17       07/17/2021 20/80 5 worse SCT, 12         08/18/2021 20//70 5 better  20/20 15       10/16/2021 20/80 4 worse SCT, 12 16 20        12/25/2021 20/80 4  TC YUT  01/10/2022 20/100 3 SL worse  20/20 3       01/24/2022 20/200 4 Sl worse cIVT 20/20 16       02/21/2022 20/200 4 hazy  20/20 17       03/14/2022 20/150 4 Sl better  20/20 20       05/16/2022 20/150 6 Sl better  20/20 19       07/16/2022 09/05/2022 CF 6 K edema YUT 20/20 17       09/27/2022    DSEK + tube repos         10/09/2022     DSEK Rebub Sutures           12/17/2022 CF 6 K better  20/20 19       03/11/2023         Start MMF    04/12/2023 20/800 5 better  20/20 19   MMF    10/04/2023  HM 7 K worse  20/20 16   MMF    12/04/2023    PKP         12/11/2023 CF 14   20/20 11   MMF                                                           Initial History of present illness:   58 year old female, works for [employer], with complex history in the right eye with 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  She has been treated here at [facility] for many years at least since 2006 and has been in the last few years treated with intraocular steroids consistently including Ozurdex  every 2 months since August 2020.    She had a Baerveldt glaucoma tube placed in twenty thirteen and is followed by Dr. Florette Hurry for glaucoma.    The full-field ERG which was done in 2014 is somewhat difficult to interpret but appears to show both decreased voltage in rod and cone signals    A detailed questionnaire regarding medical history and review of symptoms relevant to uveitis was filled out by the patient, reviewed by the ophthalmologist and scanned into the electronic medical record.       Assessment and plan:     # multifocal choroiditis OD   # macular edema OD   Pigmentary changes in the periphery with an ERG consistent with unilateral RP vs. MFC   12/02/2020 worse at 9-week Ozurdex  interval  gravity concentrated IVT lasted about 12 weeks  xipere lasts about 12 weeks  Patient prefers Xipere rather than ozurdex  , lower lid swelling  02/21/2022 concentrated triamcinolone with inflammatory reaction      12/11/2023 pt has had PKP in the interval.  Posterior pole looks okay.  Agree with plan to continue immunosuppression and topical steroids per the cornea team.  Likely that Ricardo Chamber is still helping.       fu 4  months, oct with edi, optos color and faf,      # Corneal edema right eye  S/p DSEK Rebubble + Anchoring Sutures Right Eye (10/09/22)  S/p DSEK + tube repositioning RIGHT EYE (Ranjan/Fleischman/Pascoe)(09/27/22)   - continues PF BID, following with Ranjan/Fleischman   12/04/2023 right eye PKP      #  Glaucoma, s/p tube OD,   IOP acceptable  Continue follow-up with  Dr. Florette Hurry.    #  PVD  Stable  Pt instructed to return to clinic ASAP if new floaters, flashes or curtain/shadow occur.    #  pseudophakia right eye.   Stable  Observe    #Cataract left eye  Not visually significant  Observe     # eczema  age 84 to about 71 was active   treated with systemic steroids    INTERPRETATION EXTENDED OPHTHALMOSCOPY (90D lens)  Right eye - Macula:  attached Periphery:  Attached  Left eye - Macula: Normal Periphery:Normal     Attestation:  I was present with resident during the history and exam.  I discussed the findings, assessment, and plan with the resident and agree with the findings and plan as documented in the resident's note. I reviewed any relevant imaging personally.     Alta Ast, MD           Uveitis is a serious, complex condition requiring ongoing medical care service coordination with a uveitis specialist as the focal point. [phone])

## 2023-12-12 NOTE — Unmapped (Signed)
 Clinical Assessment Needed For: Dose Change  Medication: Myfortic 360mg  EC tablet  Last Fill Date/Day Supply: 10/17/2023 / 30 days  Copay $0  Was previous dose already scheduled to fill: No    Notes to Pharmacist: N/A

## 2023-12-13 MED ORDER — MOXIFLOXACIN 0.5 % EYE DROPS
0 refills | 0.00 days
Start: 2023-12-13 — End: ?

## 2023-12-13 NOTE — Unmapped (Signed)
 [facility] Specialty and Home Delivery Pharmacy Refill Coordination Note    -Patient did not receive 3/31 delivery    Specialty Medication(s) to be Shipped:   Inflammatory Disorders: Myfortic     Other medication(s) to be shipped: No additional medications requested for fill at this time     Brandi [name], DOB: 1966/04/21  Phone: [phone] (home)       All above HIPAA information was verified with patient.     Was a Nurse, learning disability used for this call? No    Completed refill call assessment today to schedule patient's medication shipment from the [facility] and [facility] Delivery Pharmacy  [phone]).  All relevant notes have been reviewed.     Specialty medication(s) and dose(s) confirmed: Patient reports changes to the regimen as follows: Decreased to 2 tablets BID     Changes to medications: Debbera reports no changes at this time.  Changes to insurance: No  New side effects reported not previously addressed with a pharmacist or physician: None reported  Questions for the pharmacist: No    Confirmed patient received a Conservation officer, historic buildings and a Surveyor, mining with first shipment. The patient will receive a drug information handout for each medication shipped and additional FDA Medication Guides as required.       DISEASE/MEDICATION-SPECIFIC INFORMATION        N/A    SPECIALTY MEDICATION ADHERENCE              Were doses missed due to medication being on hold? No    Myfortic  360 mg mg: 7 days of medicine on hand     REFERRAL TO PHARMACIST     Referral to the pharmacist:       [facility]     Shipping address confirmed in Epic.     Cost and Payment: Patient has a $0 copay, payment information is not required.    Delivery Scheduled: Yes, Expected medication delivery date: 4/14.     Medication will be delivered via Same Day Courier to the prescription address in Epic WAM.    Brandi [name], PharmD   [facility] Specialty and Home Delivery Pharmacy  Specialty Pharmacist

## 2023-12-16 MED ORDER — MOXIFLOXACIN 0.5 % EYE DROPS
0 refills | 0.00 days | Status: CP
Start: 2023-12-16 — End: ?

## 2024-01-03 NOTE — Unmapped (Signed)
 Called let pt know we updated paperwork and refaxed patient thanks dr for his time, message sent to provider to make him aware.

## 2024-01-06 NOTE — Unmapped (Signed)
 [facility] Specialty and Home Delivery Pharmacy Refill Coordination Note    Brandi [name], DOB: 01-05-1966  Phone: [phone] (home)       All above HIPAA information was verified with patient.         01/03/2024    10:07 PM   Specialty Rx Medication Refill Questionnaire   Which Medications would you like refilled and shipped? Myfortic 360.   Please list all current allergies: Seafood and Nickel   Have you missed any doses in the last 30 days? No   Have you had any changes to your medication(s) since your last refill? No   How many days remaining of each medication do you have at home? 17   Have you experienced any side effects in the last 30 days? No   Please enter the full address (street address, city, state, zip code) where you would like your medication(s) to be delivered to. [address] , [place] [address]   Please specify on which day you would like your medication(s) to arrive. Note: if you need your medication(s) within 3 days, please call the pharmacy to schedule your order at [phone]  12/16/2023   Has your insurance changed since your last refill? No   Would you like a pharmacist to call you to discuss your medication(s)? No   Do you require a signature for your package? (Note: if we are billing Medicare Part B or your order contains a controlled substance, we will require a signature) No   I have been provided my out of pocket cost for my medication and approve the pharmacy to charge the amount to my credit card on file. Yes         Completed refill call assessment today to schedule patient's medication shipment from the [facility] and Home Delivery Pharmacy [phone]).  All relevant notes have been reviewed.       Confirmed patient received a Conservation officer, historic buildings and a Surveyor, mining with first shipment. The patient will receive a drug information handout for each medication shipped and additional FDA Medication Guides as required.         REFERRAL TO PHARMACIST     Referral to the pharmacist: Not needed      [facility]     Shipping address confirmed in Epic.     Delivery Scheduled: Yes, Expected medication delivery date: 01/15/24.     Medication will be delivered via Next Day Courier to the prescription address in Epic WAM.    Omar Bibber, PharmD   [facility] Specialty and Home Delivery Pharmacy Specialty Pharmacist

## 2024-01-10 ENCOUNTER — Ambulatory Visit: Admit: 2024-01-10 | Discharge: 2024-01-11 | Payer: PRIVATE HEALTH INSURANCE

## 2024-01-10 DIAGNOSIS — H4061X0 Glaucoma secondary to drugs, right eye, stage unspecified: Principal | ICD-10-CM

## 2024-01-10 DIAGNOSIS — H179 Unspecified corneal scar and opacity: Principal | ICD-10-CM

## 2024-01-10 DIAGNOSIS — Z9689 Presence of other specified functional implants: Principal | ICD-10-CM

## 2024-01-10 DIAGNOSIS — T380X5A Adverse effect of glucocorticoids and synthetic analogues, initial encounter: Principal | ICD-10-CM

## 2024-01-10 DIAGNOSIS — H4089 Other specified glaucoma: Principal | ICD-10-CM

## 2024-01-10 DIAGNOSIS — H30891 Other chorioretinal inflammations, right eye: Principal | ICD-10-CM

## 2024-01-10 DIAGNOSIS — H5989 Other postprocedural complications and disorders of eye and adnexa, not elsewhere classified: Principal | ICD-10-CM

## 2024-01-10 MED ORDER — PREDNISOLONE ACETATE 1 % EYE DROPS,SUSPENSION
OPHTHALMIC | 1 refills | 50.00000 days | Status: CP
Start: 2024-01-10 — End: ?

## 2024-01-10 MED ORDER — MOXIFLOXACIN 0.5 % EYE DROPS
Freq: Four times a day (QID) | OPHTHALMIC | 4 refills | 15.00000 days | Status: CP
Start: 2024-01-10 — End: ?

## 2024-01-10 NOTE — Unmapped (Signed)
 Cornea Service    Subjective   Patient ID: Brandi [name] is a 58 y.o. female.    Chief Complaint    PK OD       HPI    Patient presents for follow up status post PK in the right eye. Vision is improving. No complaints of pain, irritation. States she ran out of antibiotic drops.   Last edited by Mitcheal Amy on 01/10/2024  1:59 PM.        No current outpatient medications on file. (ANTI-ULCER PREPARATIONS)       Current Outpatient Medications (Other)   Medication Sig Dispense Refill    acetaminophen (TYLENOL) 325 MG tablet Take by mouth.      atropine 1 % ophthalmic solution Administer 1 drop to the right eye Three (3) times a day. 14 mL 3    ergocalciferol-1,250 mcg, 50,000 unit, (DRISDOL) 1,250 mcg (50,000 unit) capsule Take 1 capsule (1,250 mcg total) by mouth once a week. (Patient not taking: Reported on 05/27/2023)      fluticasone propionate (FLONASE) 50 mcg/actuation nasal spray into each nostril.      [Paused] gabapentin (NEURONTIN) 300 MG capsule Take 1 capsule (300 mg total) by mouth Three (3) times a day. (Patient taking differently: Take 1 capsule (300 mg total) by mouth once as needed.) 90 capsule 0    loratadine (CLARITIN) 10 mg tablet Take 1 tablet (10 mg total) by mouth daily.      moxifloxacin (VIGAMOX) 0.5 % ophthalmic solution Administer 1 drop to the right eye four (4) times a day. 3 mL 4    mycophenolate (MYFORTIC) 360 MG TbEC Take 2 tablets (720 mg total) by mouth two (2) times a day. 360 tablet 1    omeprazole (PRILOSEC) 20 MG capsule  (Patient not taking: Reported on 05/27/2023)      prednisoLONE acetate (PRED FORTE) 1 % ophthalmic suspension Administer 1 drop to the right eye in the morning. 10 mL 3    prednisoLONE acetate (PRED FORTE) 1 % ophthalmic suspension Administer 1 drop to the right eye every four (4) hours. 15 mL 1    simethicone (GAS-X ORAL) Take 1 tablet by mouth two (2) times a day.         Objective   Base Eye Exam       Visual Acuity (Snellen - Linear)         Right Left    Dist [place] CF @ 6 ft 20/20    Dist ph [place] NI NI              Tonometry (Icare, 1:59 PM)         Right Left    Pressure 07 14              Pupils         APD    Right None    Left None              Visual Fields         Left Right     Full               Extraocular Movement         Right Left     Full, Ortho Full, Ortho              Neuro/Psych       Oriented x3: Yes    Mood/Affect: Normal  Refraction       Manifest Refraction         Sphere Cylinder Axis Dist VA    Right [place] +2.00 020 20/400    Left [place] Sphere  20/20                        No orders of the defined types were placed in this encounter.      Assessment/Plan:   POW1 s/p PKP, Right eye   Attached retina    Plan     -Continue moxifloxacin QID, right eye   -Continue prednisolone acetate (PRED FORTE) 1% every 4 hours  BCL Airoptix BC 8.8    I discussed the above assessment and plan with the patient. She had the opportunity to ask questions, and her questions and concerns were addressed. She was reminded to call if there is any significant change or worsening in vision, or to get an evaluation, urgently if appropriate.    Donnamarie Gables MD,FICO

## 2024-01-14 MED FILL — MYFORTIC 360 MG TABLET,DELAYED RELEASE: ORAL | 30 days supply | Qty: 120 | Fill #1

## 2024-02-10 MED ORDER — PREDNISOLONE ACETATE 1 % EYE DROPS,SUSPENSION
0 refills | 0.00000 days
Start: 2024-02-10 — End: ?

## 2024-02-11 DIAGNOSIS — H30891 Other chorioretinal inflammations, right eye: Principal | ICD-10-CM

## 2024-02-13 NOTE — Unmapped (Signed)
 [facility] Specialty and Home Delivery Pharmacy Refill Coordination Note    Brandi [name], DOB: [birth year]  Phone: [phone] (home)       All above HIPAA information was verified with patient.         02/13/2024    12:13 PM   Specialty Rx Medication Refill Questionnaire   Which Medications would you like refilled and shipped? Mycophenolate 360mg    Please list all current allergies: Seafood and nickel   Have you missed any doses in the last 30 days? No   Have you had any changes to your medication(s) since your last refill? No   How many days remaining of each medication do you have at home? 7   Have you experienced any side effects in the last 30 days? No   Please enter the full address (street address, city, state, zip code) where you would like your medication(s) to be delivered to. [address],  [place] [address]   Please specify on which day you would like your medication(s) to arrive. Note: if you need your medication(s) within 3 days, please call the pharmacy to schedule your order at [phone]  02/17/2024   Has your insurance changed since your last refill? No   Would you like a pharmacist to call you to discuss your medication(s)? No   Do you require a signature for your package? (Note: if we are billing Medicare Part B or your order contains a controlled substance, we will require a signature) No   I have been provided my out of pocket cost for my medication and approve the pharmacy to charge the amount to my credit card on file. Yes         Completed refill call assessment today to schedule patient's medication shipment from the [facility] and Home Delivery Pharmacy [phone]).  All relevant notes have been reviewed.       Confirmed patient received a Conservation officer, historic buildings and a Surveyor, mining with first shipment. The patient will receive a drug information handout for each medication shipped and additional FDA Medication Guides as required.         REFERRAL TO PHARMACIST     Referral to the pharmacist: Not needed      [facility]     Shipping address confirmed in Epic.     Delivery Scheduled: Yes, Expected medication delivery date: 02/17/24.     Medication will be delivered via Same Day Courier to the prescription address in Epic WAM.    Avis Lemming   [facility] Specialty and Home Delivery Pharmacy Specialty Technician

## 2024-02-14 MED ORDER — PREDNISOLONE ACETATE 1 % EYE DROPS,SUSPENSION
Freq: Four times a day (QID) | OPHTHALMIC | 1 refills | 50.00000 days | Status: CP
Start: 2024-02-14 — End: ?

## 2024-02-17 MED FILL — MYCOPHENOLATE SODIUM 360 MG TABLET,DELAYED RELEASE: ORAL | 30 days supply | Qty: 120 | Fill #2

## 2024-02-17 NOTE — Unmapped (Signed)
 [facility]ty and [facility] Delivery Pharmacy - Clinical Pharmacist Intervention   Reason for Intervention Issue Observed Therapeutic monitoring          Additional Comments     Intervention Type of Intervention Provider contacted                  Recommendation provided, if applicable      Medication Medication(s) Involved Myfortic   Outcome Expected Result/Outcome of Intervention Cost savings    Additional Comments Patient's brand Myfortic copay increased from $0 to $170. This is cost prohibitive and patient requested to switch to generic mycphenolate.  Origianal RX was NOT DAW 1.  Patient had previoulsy requested Brand to be filled so we are able to change script to generic.  Sent a message to MD and CPP to inform them of the change in case follow-up labs are needed.    Follow-up Needed? No    Additional Follow-up Comments     Supporting Information Approximate Time Spent on Intervention 11-20 minutes    Clinical Evidence Used Chart review or clinical coordination

## 2024-02-21 DIAGNOSIS — Z947 Corneal transplant status: Principal | ICD-10-CM

## 2024-02-21 NOTE — Unmapped (Signed)
 Cornea Service    Subjective   Patient ID: Brandi [name] is a 58 y.o. female.    Chief Complaint    Cornea Evaluation       HPI       Cornea Evaluation    In right eye.             Comments    Patient presents for a cornea evaluation. Patient has noticed an improvement in vision. Denies pain, flashes and has less floaters.           Last edited by Theophilus Stabs on 02/21/2024  1:48 PM.        No current outpatient medications on file. (ANTI-ULCER PREPARATIONS)       Current Outpatient Medications (Other)   Medication Sig Dispense Refill    acetaminophen (TYLENOL) 325 MG tablet Take by mouth.      atropine  1 % ophthalmic solution Administer 1 drop to the right eye Three (3) times a day. 14 mL 3    ergocalciferol-1,250 mcg, 50,000 unit, (DRISDOL) 1,250 mcg (50,000 unit) capsule Take 1 capsule (1,250 mcg total) by mouth once a week. (Patient not taking: Reported on 05/27/2023)      fluticasone propionate (FLONASE) 50 mcg/actuation nasal spray into each nostril.      [Paused] gabapentin  (NEURONTIN ) 300 MG capsule Take 1 capsule (300 mg total) by mouth Three (3) times a day. (Patient taking differently: Take 1 capsule (300 mg total) by mouth once as needed.) 90 capsule 0    loratadine (CLARITIN) 10 mg tablet Take 1 tablet (10 mg total) by mouth daily.      moxifloxacin  (VIGAMOX ) 0.5 % ophthalmic solution Administer 1 drop to the right eye four (4) times a day. 3 mL 4    mycophenolate  (MYFORTIC ) 360 MG TbEC Take 2 tablets (720 mg total) by mouth two (2) times a day. 360 tablet 1    omeprazole (PRILOSEC) 20 MG capsule  (Patient not taking: Reported on 05/27/2023)      prednisoLONE  acetate (PRED FORTE ) 1 % ophthalmic suspension Administer 1 drop to the right eye every four (4) hours. 15 mL 1    prednisoLONE  acetate (PRED FORTE ) 1 % ophthalmic suspension Administer 1 drop to the right eye four (4) times a day. 10 mL 1    simethicone (GAS-X ORAL) Take 1 tablet by mouth two (2) times a day.         Objective   Base Eye Exam Visual Acuity (Snellen - Linear)         Right Left    Dist cc 20/150 20/20      Correction: Glasses              Tonometry (Icare, 1:52 PM)         Right Left    Pressure 5 14              Neuro/Psych       Oriented x3: Yes    Mood/Affect: Normal                        No orders of the defined types were placed in this encounter.      Assessment/Plan:   Right eye: Clear graft  Continue PF QID  PFAT  follow UP 4 WEEKS    I discussed the above assessment and plan with the patient. She had the opportunity to ask questions, and her questions and concerns were addressed. She was reminded to  call if there is any significant change or worsening in vision, or to get an evaluation, urgently if appropriate.    Emery Peri MD,FICO

## 2024-02-21 NOTE — Unmapped (Signed)
 Addended by: DAWSON RE on: 02/21/2024 03:49 PM     Modules accepted: Orders

## 2024-03-04 MED ORDER — ERYTHROMYCIN 5 MG/GRAM (0.5 %) EYE OINTMENT
Freq: Every evening | OPHTHALMIC | 1 refills | 0.00000 days | Status: CP
Start: 2024-03-04 — End: 2024-04-03

## 2024-03-04 NOTE — Unmapped (Signed)
 S/p PK (Soleimani 12/04/23)  S/p DSEK Rebubble + Anchoring Sutures OD (10/09/22)  S/p DSEK + tube repositioning OD (Ranjan/Fleischman/Pascoe)(09/27/22)   - Patient presenting to urgent care for intermittent right eye pain s/p PK  - Graft looks good with 1+ d folds, seidel negative, no epi defects  - Continue PF QID OD  - Start lubrication with erythromycin  nightly, artificial tears 3-4x/day     RTC as scheduled with Dr. Dawson    Seen with dr. Sumner.    ________________________    Rosalynn Fairly, MD  PGY-4, Department of Ophthalmology  [facility]  [address], [place], [place] [address]  [phone]

## 2024-03-07 NOTE — Unmapped (Signed)
 [facility] Specialty and Home Delivery Pharmacy Refill Coordination Note    Brandi [name], DOB: September 07, 1965  Phone: [phone] (home)       All above HIPAA information was verified with patient.         03/06/2024     9:29 AM   Specialty Rx Medication Refill Questionnaire   Which Medications would you like refilled and shipped? MYFORTIC  360 MG TBEC   Please list all current allergies: Seafood and nickel   Have you missed any doses in the last 30 days? No   Have you had any changes to your medication(s) since your last refill? No   How many days remaining of each medication do you have at home? 14   Have you experienced any side effects in the last 30 days? No   Please enter the full address (street address, city, state, zip code) where you would like your medication(s) to be delivered to. [address] , [place] [address]   Please specify on which day you would like your medication(s) to arrive. Note: if you need your medication(s) within 3 days, please call the pharmacy to schedule your order at [phone]  03/16/2024   Has your insurance changed since your last refill? No   Would you like a pharmacist to call you to discuss your medication(s)? No   Do you require a signature for your package? (Note: if we are billing Medicare Part B or your order contains a controlled substance, we will require a signature) No   I have been provided my out of pocket cost for my medication and approve the pharmacy to charge the amount to my credit card on file. Yes   Additional Comments: This medication is free to me per my Insurance         Completed refill call assessment today to schedule patient's medication shipment from the [facility] Specialty and Home Delivery Pharmacy [phone]).  All relevant notes have been reviewed.       Confirmed patient received a Conservation officer, historic buildings and a Surveyor, mining with first shipment. The patient will receive a drug information handout for each medication shipped and additional FDA Medication Guides as required.         REFERRAL TO PHARMACIST     Referral to the pharmacist: Not needed      [facility]     Shipping address confirmed in Epic.     Delivery Scheduled: Yes, Expected medication delivery date: 7.14.25.     Medication will be delivered via Same Day Courier to the prescription address in Epic WAM.    [name]   [facility] Specialty and Home Delivery Pharmacy Specialty Technician

## 2024-03-16 MED FILL — MYCOPHENOLATE SODIUM 360 MG TABLET,DELAYED RELEASE: ORAL | 30 days supply | Qty: 120 | Fill #3

## 2024-03-23 DIAGNOSIS — H30891 Other chorioretinal inflammations, right eye: Principal | ICD-10-CM

## 2024-03-23 DIAGNOSIS — Z947 Corneal transplant status: Principal | ICD-10-CM

## 2024-03-23 DIAGNOSIS — H179 Unspecified corneal scar and opacity: Principal | ICD-10-CM

## 2024-03-23 DIAGNOSIS — Z9689 Presence of other specified functional implants: Principal | ICD-10-CM

## 2024-03-23 NOTE — Unmapped (Signed)
 [facility]    Subjective   Patient ID: Brandi [name] is a 59 y.o. female.    Chief Complaint    PK OD       HPI    Patient presents three months status post penetrating keratoplasty in the right eye. Vision still blurry.   Last edited by Jasmine Soulier on 03/23/2024  8:44 AM.        No current outpatient medications on file. (ANTI-ULCER PREPARATIONS)       Current Outpatient Medications (Other)   Medication Sig Dispense Refill    acetaminophen (TYLENOL) 325 MG tablet Take by mouth.      atropine  1 % ophthalmic solution Administer 1 drop to the right eye Three (3) times a day. 14 mL 3    ergocalciferol-1,250 mcg, 50,000 unit, (DRISDOL) 1,250 mcg (50,000 unit) capsule Take 1 capsule (1,250 mcg total) by mouth once a week. (Patient not taking: Reported on 05/27/2023)      erythromycin  (ROMYCIN) 5 mg/gram (0.5 %) ophthalmic ointment Administer to the right eye nightly. 3.5 g 1    fluticasone propionate (FLONASE) 50 mcg/actuation nasal spray into each nostril.      [Paused] gabapentin  (NEURONTIN ) 300 MG capsule Take 1 capsule (300 mg total) by mouth Three (3) times a day. (Patient taking differently: Take 1 capsule (300 mg total) by mouth once as needed.) 90 capsule 0    loratadine (CLARITIN) 10 mg tablet Take 1 tablet (10 mg total) by mouth daily.      moxifloxacin  (VIGAMOX ) 0.5 % ophthalmic solution Administer 1 drop to the right eye four (4) times a day. 3 mL 4    mycophenolate  (MYFORTIC ) 360 MG TbEC Take 2 tablets (720 mg total) by mouth two (2) times a day. 360 tablet 1    omeprazole (PRILOSEC) 20 MG capsule  (Patient not taking: Reported on 05/27/2023)      prednisoLONE  acetate (PRED FORTE ) 1 % ophthalmic suspension Administer 1 drop to the right eye every four (4) hours. 15 mL 1    prednisoLONE  acetate (PRED FORTE ) 1 % ophthalmic suspension Administer 1 drop to the right eye four (4) times a day. 10 mL 1    simethicone (GAS-X ORAL) Take 1 tablet by mouth two (2) times a day.         Objective   Base Eye Exam Visual Acuity (Snellen - Linear)         Right Left    Dist cc 20/150 -2 20/15    Dist ph cc NI       Correction: Glasses              Tonometry (Icare, 8:43 AM)         Right Left    Pressure 07 24              Pupils         APD    Right None    Left None              Visual Fields         Left Right     Full               Extraocular Movement         Right Left     xt      -- -- --   --  --   -- -- --    0 0 0   0  0   0 0  0                 Neuro/Psych       Oriented x3: Yes    Mood/Affect: Normal                  Slit Lamp and Fundus Exam       External Exam         Right Left    External Normal Normal              Slit Lamp Exam         Right Left    Lids/Lashes Normal normal    Conjunctiva/Sclera White and quiet white and quiet    Cornea Clear clear    Anterior Chamber shallow with 360 PAS deep and quiet    Iris IK touch round    Lens PCIOL, ant capsular phimosis 1+ NS    Anterior Vitreous Hazy view syneresis                  Refraction       Manifest Refraction         Sphere Cylinder Axis Dist VA    Right +2.50 +2.00 010 20/150    Left                    Assessment:      Right eye: Pseudophakia  Clear graft  PAS  Controlled glaucoma    Plan: Continue PF 3 times day  PFAT    Patient is under follow up with Dr zoe (is using mycophenolate  720 mg/BD and Dr. Candise  I discussed the above assessment and plan with the patient. She had the opportunity to ask questions, and her questions and concerns were addressed. She was reminded to call if there is any significant change or worsening in vision, or to get an evaluation, urgently if appropriate.    Emery Peri MD,FICO

## 2024-03-24 NOTE — Unmapped (Signed)
#   Glaucoma, steroid-response, indeterminate stage, s/p tube shunt, OD  # Glaucoma suspect, OS    - S/p DSEK + tube repositioning RIGHT EYE 09/27/22   - IOP good today, AC still shallow. Graft remains edematous   - Prior HVF unreliable OU but OS without any concerning defects.  - RNFL left eye full and stable, no view right eye   03/2024   - OD: MD: -3.57; IAS>SNS; GHT ONL (VFI: 90%)   - OS: high FP ; GHT AHS  -- Plan:  -- continue PF daily   -- continue with Dr. Dawson as scheduled   -- RTC 6 months DFE/OCT      # Secondary Corneal edema right eye  - sp DSEK and tube repositioning right eye    # multifocal choroiditis OD   # macular edema OD   - Pigmentary changes in the periphery with an ERG consistent with unilateral RP vs. MFC   - Was treated w/ Ozurdex  in 2022 with worsened  VA  - S/p IV dex, IVT in 2022-2023  - Per Willett, was planning to start Xipere  - Patient has been tapering PF, now on once/day  - Macular edema improved on OCT today  - Follow up Willett as scheduled    #  pseudophakia right eye.   Stable  Observe    #Cataract left eye  Not visually significant  Observe     I saw and evaluated the patient, participating in the key portions of the service.  I reviewed the resident???s note.  I agree with the resident???s findings and plan. ALM DANAS, MD, MS, FACS

## 2024-04-07 NOTE — Unmapped (Signed)
 [facility] Clinical Assessment & Refill Coordination Note    Brandi [name], DOB: 01-15-1966  Phone: [phone] (home)     All above HIPAA information was verified with patient.     Was a Nurse, learning disability used for this call? No    Specialty Medication(s):   Inflammatory Disorders: mycophenolate      Current Medications[1]     Changes to medications: Brandi [name] reports no changes at this time.    Medication list has been reviewed and updated in Epic: Yes    Allergies[2]    Changes to allergies: No    Allergies have been reviewed and updated in Epic: Yes    SPECIALTY MEDICATION ADHERENCE     mycophenolate  360 mg: 12 days of medicine on hand       Medication Adherence    Patient reported X missed doses in the last month: 1  Specialty Medication: mycophenolate  360mg   Patient is on additional specialty medications: No  Informant: patient          Specialty medication(s) dose(s) confirmed: Regimen is correct and unchanged.     Are there any concerns with adherence? No    Adherence counseling provided? Not needed    CLINICAL MANAGEMENT AND INTERVENTION      Clinical Benefit Assessment:    Do you feel the medicine is effective or helping your condition? Yes    Clinical Benefit counseling provided? Progress note from 11/18/23 shows evidence of clinical benefit    Adverse Effects Assessment:    Are you experiencing any side effects? No    Are you experiencing difficulty administering your medicine? No    Quality of Life Assessment:    Quality of Life    Rheumatology  Oncology  Dermatology  Cystic Fibrosis          How many days over the past month did your condition  keep you from your normal activities? For example, brushing your teeth or getting up in the morning. Patient declined to answer    Have you discussed this with your provider? Yes    Acute Infection Status:    Acute infections noted within Epic:  No active infections    Patient reported infection: None    Therapy Appropriateness:    Is therapy appropriate based on current medication list, adverse reactions, adherence, clinical benefit and progress toward achieving therapeutic goals? Yes, therapy is appropriate and should be continued     Clinical Intervention:    Was an intervention completed as part of this clinical assessment? No    DISEASE/MEDICATION-SPECIFIC INFORMATION      N/A    Chronic Inflammatory Diseases: Have you experienced any flares in the last month? No    PATIENT SPECIFIC NEEDS     Does the patient have any physical, cognitive, or cultural barriers? No    Is the patient high risk? No    Does the patient require physician intervention or other additional services (i.e., nutrition, smoking cessation, social work)? No    Does the patient have an additional or emergency contact listed in their chart? Yes    SOCIAL DETERMINANTS OF HEALTH     At the [facility], we have learned that life circumstances - like trouble affording food, housing, utilities, or transportation can affect the health of many of our patients.   That is why we wanted to ask: are you currently experiencing any life circumstances that are negatively impacting your health and/or quality of life? No    Social  Drivers of Health     Food Insecurity: Not on file   Tobacco Use: Low Risk  (03/24/2024)    Patient History     Smoking Tobacco Use: Never     Smokeless Tobacco Use: Never     Passive Exposure: Not on file   Transportation Needs: Not on file   Alcohol Use: Not on file   Housing: Unknown (11/14/2023)    Received from [facility]    Housing Stability Vital Sign     Unable to Pay for Housing in the Last Year: Not on file     Number of Times Moved in the Last Year: Not on file     At any time in the past 12 months, were you homeless or living in a shelter (including now)?: No   Physical Activity: Sufficiently Active (04/24/2018)    Received from [facility]    Exercise Vital Sign     Days of Exercise per Week: 3 days     Minutes of Exercise per Session: 60 min   Utilities: Not on file   Stress: Not on file   Interpersonal Safety: Not on file   Substance Use: Not on file (07/08/2023)   Intimate Partner Violence: Not on file   Social Connections: Not on file   Financial Resource Strain: Not on file   Health Literacy: Not on file   Internet Connectivity: Not on file       Would you be willing to receive help with any of the needs that you have identified today? Not applicable       SHIPPING     Specialty Medication(s) to be Shipped:   Inflammatory Disorders: mycophenolate     Other medication(s) to be shipped: No additional medications requested for fill at this time     Changes to insurance: No    Cost and Payment: Patient has a $0 copay, payment information is not required.    Delivery Scheduled: Yes, Expected medication delivery date: 8/12.     Medication will be delivered via Next Day Courier to the confirmed prescription address in [facility].    The patient will receive a drug information handout for each medication shipped and additional FDA Medication Guides as required.  Verified that patient has previously received a Conservation officer, historic buildings and a Surveyor, mining.    The patient or caregiver noted above participated in the development of this care plan and knows that they can request review of or adjustments to the care plan at any time.      All of the patient's questions and concerns have been addressed.    Brandi [name] Kin, PharmD   [facility] Specialty Pharmacist       [1]   Current Outpatient Medications   Medication Sig Dispense Refill    acetaminophen (TYLENOL) 325 MG tablet Take by mouth.      atropine  1 % ophthalmic solution Administer 1 drop to the right eye Three (3) times a day. 14 mL 3    ergocalciferol-1,250 mcg, 50,000 unit, (DRISDOL) 1,250 mcg (50,000 unit) capsule Take 1 capsule (1,250 mcg total) by mouth once a week. (Patient not taking: Reported on 05/27/2023)      fluticasone propionate (FLONASE) 50 mcg/actuation nasal spray into each nostril.      [Paused] gabapentin  (NEURONTIN ) 300 MG capsule Take 1 capsule (300 mg total) by mouth Three (3) times a day. (Patient taking differently: Take 1 capsule (300 mg total) by  mouth once as needed.) 90 capsule 0    loratadine (CLARITIN) 10 mg tablet Take 1 tablet (10 mg total) by mouth daily.      moxifloxacin  (VIGAMOX ) 0.5 % ophthalmic solution Administer 1 drop to the right eye four (4) times a day. 3 mL 4    mycophenolate  (MYFORTIC ) 360 MG TbEC Take 2 tablets (720 mg total) by mouth two (2) times a day. 360 tablet 1    omeprazole (PRILOSEC) 20 MG capsule  (Patient not taking: Reported on 05/27/2023)      prednisoLONE  acetate (PRED FORTE ) 1 % ophthalmic suspension Administer 1 drop to the right eye every four (4) hours. 15 mL 1    prednisoLONE  acetate (PRED FORTE ) 1 % ophthalmic suspension Administer 1 drop to the right eye four (4) times a day. 10 mL 1    simethicone (GAS-X ORAL) Take 1 tablet by mouth two (2) times a day.       Current Facility-Administered Medications   Medication Dose Route Frequency Provider Last Rate Last Admin    fluocinolone (YUTIQ) intravitreal implant 0.18 mg  0.18 mg Intravitreal Once Willett, Keirnan L, MD         Facility-Administered Medications Ordered in Other Visits   Medication Dose Route Frequency Provider Last Rate Last Admin    [EXPIRED] triamcinolone acetonide (PF) (XIPERE) injection 4 mg  4 mg suprachoroidal  Willett, Keirnan L, MD   4 mg at 10/16/21 9072   [2]   Allergies  Allergen Reactions    Nickel Rash    Shellfish Containing Products Rash

## 2024-04-13 DIAGNOSIS — H30891 Other chorioretinal inflammations, right eye: Principal | ICD-10-CM

## 2024-04-13 DIAGNOSIS — H35351 Cystoid macular degeneration, right eye: Principal | ICD-10-CM

## 2024-04-13 MED FILL — MYCOPHENOLATE SODIUM 360 MG TABLET,DELAYED RELEASE: ORAL | 30 days supply | Qty: 120 | Fill #4

## 2024-04-13 NOTE — Unmapped (Signed)
 [facility]    Timeline Summary  #Multifocal choroiditis right eye   #Steroid-induced glaucoma right eye  #Cystoid macular edema right eye                 Right    Left    Systemic    Date VA IOP Inf. Plan VA IOP Inf. Plan     08/07/1966          DOB    2004             10/25/2011    IOL, BGI         12/31/2012    IVK         03/27/2013   ERG           05/24/2017 20/80  8   20/20 12       05/29/2017 20/60    IVK 20/20        08/21/2017 20/60  9   20/25  13       09/12/2017 20/80  6  IVK 20/20 12       12/18/2017 20/40 8   20/25 14       01/17/2018 20/80   IVK 20/25 13       02/06/2018    IVK         03/07/2018 20/60  11   20/20  16       05/02/2018 HM 5  IVK 20/20 16       06/06/2018 20/70  10   20/20 12       08/14/2018 20/70  10  IVK 20/25 18       10/30/2018 20/100 9  IVK 20/20 18       01/15/2019 20/80  10  IVK 20/20  17       04/21/2019 20/80  7  OZD 20/20  20       04/28/2019 20/200 10   20/20 16       07/07/2019 20/400 9  OZD 20/20 12       09/09/2019 20/60  6  OZD 20/30  16       11/11/2019 20/80  5  OZD 20/25 15       01/13/2020 20/80 6  OZD 20/25  19       03/22/2020 20/200 7  OZD 20/25 10       05/24/2020 20/200 4  OZD 20/25  20       07/26/2020 20/200  7  OZD 20/20  20      KLW 09/28/2020 20/200 8 sl better cme OZD 8 20/20 16       11/04/2020  20/50 9 cme better 5 20/20 19       12/02/2020 20/60 3 cme worse OZD 9 20/20 16       01/20/2021  20/60  9 cme stable IVK 7   14       03/10/2021  20/60  8  CME improved   20/20  16      KLW 04/19/2021 20/70 5 CME sl worse SCT 12 20/20 12       06/16/2021 20/70 3 CME better  20/25 17       07/17/2021 20/80 5 worse SCT, 12         08/18/2021 20//70 5 better  20/20 15       10/16/2021 20/80 4 worse SCT, 12 16 20        12/25/2021 20/80 4  TC YUT  01/10/2022 20/100 3 SL worse  20/20 3       01/24/2022 20/200 4 Sl worse cIVT 20/20 16       02/21/2022 20/200 4 hazy  20/20 17       03/14/2022 20/150 4 Sl better  20/20 20       05/16/2022 20/150 6 Sl better  20/20 19       07/16/2022 09/05/2022 CF 6 K edema YUT 20/20 17       09/27/2022    DSEK + tube repos         10/09/2022     DSEK Rebub Sutures           12/17/2022 CF 6 K better  20/20 19       03/11/2023         Start MMF    04/12/2023 20/800 5 better  20/20 19   MMF    10/04/2023  HM 7 K worse  20/20 16   MMF    12/04/2023    PKP         12/11/2023 CF 14   20/20 11   MMF    04/13/2024   20/100 7   20/20 21                                                 Initial History of present illness:   58 year old female, works for [employer], with complex history in the right eye with 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  She has been treated here at [facility] for many years at least since 2006 and has been in the last few years treated with intraocular steroids consistently including Ozurdex  every 2 months since August 2020.    She had a Baerveldt glaucoma tube placed in twenty thirteen and is followed by Dr. Barry for glaucoma.    The full-field ERG which was done in 2014 is somewhat difficult to interpret but appears to show both decreased voltage in rod and cone signals    A detailed questionnaire regarding medical history and review of symptoms relevant to uveitis was filled out by the patient, reviewed by the ophthalmologist and scanned into the electronic medical record.       Assessment and plan:     # multifocal choroiditis OD   # macular edema OD   Pigmentary changes in the periphery with an ERG consistent with unilateral RP vs. 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 No macular edema. I agree with plan to continue mycophenolate .       Return in about 4 months (around 08/13/2024) for OCT with EDI, optos color and faf, Match Prior.      # Corneal edema right eye  S/p DSEK Rebubble + Anchoring Sutures Right Eye (10/09/22)  S/p DSEK + tube repositioning RIGHT EYE (Ranjan/Fleischman/Pascoe)(09/27/22)   - continues PF BID, following with Ranjan/Fleischman   12/04/2023 right eye PKP      #  Glaucoma, s/p tube OD,   IOP acceptable  Continue follow-up with Dr. Barry.    #  PVD  Stable  Pt instructed to return to clinic ASAP if new floaters, flashes or curtain/shadow occur.    #  pseudophakia right eye.   Stable  Observe    #Cataract left eye  Not visually significant  Observe     # eczema  age 41 to about 18 was active   treated with systemic steroids    INTERPRETATION EXTENDED OPHTHALMOSCOPY (90D lens)  Right eye - Macula:  attached Periphery:  Attached  Left eye - Macula: Normal Periphery:Normal     Attestation:  I was present with resident during the history and exam.  I discussed the findings, assessment, and plan with the resident and agree with the findings and plan as documented in the resident's note. I reviewed any relevant imaging personally.     Wendy LITTIE Lucks, MD           Uveitis is a serious, complex condition requiring ongoing medical care service coordination with a uveitis specialist as the focal point. (G2211)

## 2024-05-07 NOTE — Unmapped (Signed)
 [facility] Refill Coordination Note    9.4.25 - $0.00 copay      Brandi [name], DOB: 1966-07-20  Phone: [phone] (home)       All above HIPAA information was verified with patient.         05/02/2024     8:24 AM   Specialty Rx Medication Refill Questionnaire   Which Medications would you like refilled and shipped? Myfortic    Please list all current allergies: Seafood and nickel   Have you missed any doses in the last 30 days? Yes   If Yes, how many doses have you missed ? 2   Have you had any changes to your medication(s) since your last refill? No   How much of each medication do you have remaining at home? (eg. number of tablets, injections, etc.) 80 tablets   Have you experienced any side effects in the last 30 days? No   Please enter the full address (street address, city, state, zip code) where you would like your medication(s) to be delivered to. [address] , [place] [address]   Please specify on which day you would like your medication(s) to arrive. Note: if you need your medication(s) within 3 days, please call the pharmacy to schedule your order at [phone]  05/19/2024   Has your insurance changed since your last refill? No   Would you like a pharmacist to call you to discuss your medication(s)? No   Do you require a signature for your package? (Note: if we are billing Medicare Part B or your order contains a controlled substance, we will require a signature) No   I have been provided my out of pocket cost for my medication and approve the pharmacy to charge the amount to my credit card on file. No   Additional Information Confirm   Additional Comments: There is no out of pocket cost to me for the generic myfortic          Completed refill call assessment today to schedule patient's medication shipment from the [facility] [phone]).  All relevant notes have been reviewed.       Confirmed patient received a Conservation officer, historic buildings and a Surveyor, mining with first shipment. The patient will receive a drug information handout for each medication shipped and additional FDA Medication Guides as required.         REFERRAL TO PHARMACIST     Referral to the pharmacist: Yes - routine compliance concerns. Patient has missed 1-3 doses of medication. Refills were scheduled and concern routed to pharmacist for evaluation.      SHIPPING     Shipping address confirmed in Epic.     Delivery Scheduled: Yes, Expected medication delivery date: 9.16.25.     Medication will be delivered via Next Day Courier to the prescription address in Epic WAM.    Brandi [name]   [facility] Specialty Technician

## 2024-05-08 NOTE — Unmapped (Signed)
 This pharmacist was notified by a technician that this patient has reported that they've missed 2 doses of their myfortic .. I have reviewed the patient's medical record and have determined that no further pharmacist action is needed.      Approximate time spent: 0-5 minutes    [name], PharmD, Clinical Specialty Pharmacist  [facility]

## 2024-05-11 ENCOUNTER — Ambulatory Visit: Admitting: Family Medicine

## 2024-05-18 MED FILL — MYCOPHENOLATE SODIUM 360 MG TABLET,DELAYED RELEASE: ORAL | 30 days supply | Qty: 120 | Fill #5

## 2024-05-25 DIAGNOSIS — H30891 Other chorioretinal inflammations, right eye: Principal | ICD-10-CM

## 2024-05-25 DIAGNOSIS — H179 Unspecified corneal scar and opacity: Principal | ICD-10-CM

## 2024-05-25 DIAGNOSIS — Z947 Corneal transplant status: Principal | ICD-10-CM

## 2024-05-25 MED ORDER — PREDNISOLONE ACETATE 1 % EYE DROPS,SUSPENSION
Freq: Four times a day (QID) | OPHTHALMIC | 5 refills | 50.00000 days | Status: CP
Start: 2024-05-25 — End: ?

## 2024-05-25 MED ORDER — KETOROLAC 0.5 % EYE DROPS
Freq: Four times a day (QID) | OPHTHALMIC | 1 refills | 25.00000 days | Status: CN
Start: 2024-05-25 — End: ?

## 2024-05-25 NOTE — Unmapped (Signed)
 Cornea Service    Subjective   Patient ID: Brandi [name] is a 58 y.o. female.      HPI    58 yo female presents for status post penetrating keratoplasty, right eye (12/04/23). Patient reports vision is stable. She denies eye pain and irritation. She is using prednisolone  TID and Systane.   Last edited by Nicholaus Ivonne SAUNDERS on 05/25/2024  8:29 AM.        No current outpatient medications on file. (ANTI-ULCER PREPARATIONS)       Current Outpatient Medications (Other)   Medication Sig Dispense Refill    acetaminophen (TYLENOL) 325 MG tablet Take by mouth.      fluticasone propionate (FLONASE) 50 mcg/actuation nasal spray into each nostril.      [Paused] gabapentin  (NEURONTIN ) 300 MG capsule Take 1 capsule (300 mg total) by mouth Three (3) times a day. (Patient taking differently: Take 1 capsule (300 mg total) by mouth once as needed.) 90 capsule 0    loratadine (CLARITIN) 10 mg tablet Take 1 tablet (10 mg total) by mouth daily.      mycophenolate  (MYFORTIC ) 360 MG TbEC Take 2 tablets (720 mg total) by mouth two (2) times a day. 360 tablet 1    prednisoLONE  acetate (PRED FORTE ) 1 % ophthalmic suspension Administer 1 drop to the right eye every four (4) hours. 15 mL 1    prednisoLONE  acetate (PRED FORTE ) 1 % ophthalmic suspension Administer 1 drop to the right eye four (4) times a day. 10 mL 1    predniSONE  (DELTASONE ) 20 MG tablet TAKE 1 TABLET BY MOUTH TWICE DAILY FOR 5 DAYS         Objective   Base Eye Exam       Visual Acuity (Snellen - Linear)         Right Left    Dist cc 20/150 +1 20/20    Dist ph cc 20/80 -1       Correction: Glasses              Tonometry (Icare, 8:34 AM)         Right Left    Pressure 6 8              Pupils         APD    Right None    Left None              Visual Fields (Counting fingers)         Left Right     Full     Restrictions  Partial outer superior temporal, inferior temporal, superior nasal, inferior nasal deficiencies              Extraocular Movement         Right Left     Full Full Neuro/Psych       Oriented x3: Yes    Mood/Affect: Normal                  Slit Lamp and Fundus Exam       External Exam         Right Left    External Normal Normal              Slit Lamp Exam         Right Left    Lids/Lashes Normal normal    Conjunctiva/Sclera White and quiet white and quiet    Cornea Clear, well attached DSAEK Lenticule clear  Anterior Chamber shallow with 360 PAS deep and quiet    Iris IK touch, ectropion Uvea, PAS round    Lens PCIOL, ant capsular phimosis 1+ NS    Anterior Vitreous syneresis syneresis              Fundus Exam         Right Left    Macula decreased foveal reflex     Vessels Normal     Periphery Normal                         No orders of the defined types were placed in this encounter.      Assessment/Plan:   Cornea Service    Subjective   Patient ID: Brandi [name] is a 58 y.o. female.        HPI    58 yo female presents for status post penetrating keratoplasty, right eye (12/04/23). Patient reports vision is stable. She denies eye pain and irritation. She is using prednisolone  TID and Systane.   Last edited by Nicholaus Ivonne SAUNDERS on 05/25/2024  8:29 AM.        No current outpatient medications on file. (ANTI-ULCER PREPARATIONS)       Current Outpatient Medications (Other)   Medication Sig Dispense Refill    acetaminophen (TYLENOL) 325 MG tablet Take by mouth.      fluticasone propionate (FLONASE) 50 mcg/actuation nasal spray into each nostril.      [Paused] gabapentin  (NEURONTIN ) 300 MG capsule Take 1 capsule (300 mg total) by mouth Three (3) times a day. (Patient taking differently: Take 1 capsule (300 mg total) by mouth once as needed.) 90 capsule 0    loratadine (CLARITIN) 10 mg tablet Take 1 tablet (10 mg total) by mouth daily.      mycophenolate  (MYFORTIC ) 360 MG TbEC Take 2 tablets (720 mg total) by mouth two (2) times a day. 360 tablet 1    prednisoLONE  acetate (PRED FORTE ) 1 % ophthalmic suspension Administer 1 drop to the right eye every four (4) hours. 15 mL 1    prednisoLONE  acetate (PRED FORTE ) 1 % ophthalmic suspension Administer 1 drop to the right eye four (4) times a day. 10 mL 1    predniSONE  (DELTASONE ) 20 MG tablet TAKE 1 TABLET BY MOUTH TWICE DAILY FOR 5 DAYS         Objective   Base Eye Exam       Visual Acuity (Snellen - Linear)         Right Left    Dist cc 20/150 +1 20/20    Dist ph cc 20/80 -1       Correction: Glasses              Tonometry (Icare, 8:34 AM)         Right Left    Pressure 6 8              Pupils         APD    Right None    Left None              Visual Fields (Counting fingers)         Left Right     Full     Restrictions  Partial outer superior temporal, inferior temporal, superior nasal, inferior nasal deficiencies              Extraocular Movement  Right Left     Full Full              Neuro/Psych       Oriented x3: Yes    Mood/Affect: Normal                  Slit Lamp and Fundus Exam       External Exam         Right Left    External Normal Normal              Slit Lamp Exam         Right Left    Lids/Lashes Normal normal    Conjunctiva/Sclera White and quiet white and quiet    Cornea Clear, well attached DSAEK Lenticule clear    Anterior Chamber shallow with 360 PAS deep and quiet    Iris IK touch, ectropion Uvea, PAS round    Lens PCIOL, ant capsular phimosis 1+ NS    Anterior Vitreous syneresis syneresis              Fundus Exam         Right Left    Macula decreased foveal reflex     Vessels Normal     Periphery Normal                 Assessment:      Right eye: Pseudophakia  Clear graft  PAS  Controlled glaucoma    Plan: Continue PF 3 times day  PFAT    Patient is under follow up with Dr zoe (is using mycophenolate  720 mg/BD and Dr. Candise    I discussed the above assessment and plan with the patient. She had the opportunity to ask questions, and her questions and concerns were addressed. She was reminded to call if there is any significant change or worsening in vision, or to get an evaluation, urgently if appropriate.    Emery Peri MD,FICO

## 2024-05-29 ENCOUNTER — Ambulatory Visit: Admitting: Family Medicine

## 2024-05-31 NOTE — Unmapped (Signed)
 Assessment/Plan:   Brandi [name] is a 58 y.o. female with PMH of macular edema, glaucoma, cataract of left eye, seasonal allergies, multifocal choroiditis of right eye here for follow up.    # Multifocal Choroiditis, right eye   Patient has had a longstanding history of multifocal choroiditis of the right eye and follows with Dr. Shirley. She has had multiple rounds of intra ocular steroids, most recently [place]. She has had an extensive workup in the past for infectious and autoimmune causes which were negative. There is no evidence of systemic autoimmune cause of her multifocal choroiditis. She has no evidence of ANCA vasculitis, sarcoidosis, Behcet's, seronegative spondyloarthropathy or SLE. 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 Switched to Myfortic  due to GI side effects which she is tolerating.   Plan:  - Continue Myfortic  720 mg bid   - Continue close follow up with Dr. Shirley   - Obtain medication monitoring labs, ordered as future to be obtained at [facility] at patient's convenience    HCM:  - PCV 20: June 2024   - Flu and COVID booster Sept 2024  - Shingles: 09/2022, 11/2022      The patient reports they are currently: at home. I spent 20 minutes on the real-time audio and video visit with the patient on the date of service. I spent an additional 10 minutes on pre- and post-visit activities on the date of service.     The patient was located and I was located within 250 yards of a hospital based location during the real-time audio and video visit. The patient was physically located in [place]  or a state in which I am permitted to provide care. The patient and/or parent/guardian understood that s/he may incur co-pays and cost sharing, and agreed to the telemedicine visit. The visit was reasonable and appropriate under the circumstances given the patient's presentation at the time.    The patient and/or parent/guardian has been advised of the potential risks and limitations of this mode of treatment (including, but not limited to, the absence of in-person examination) and has agreed to be treated using telemedicine. The patient's/patient's family's questions regarding telemedicine have been answered.    If the visit was completed in an ambulatory setting, the patient and/or parent/guardian has also been advised to contact their provider???s office for worsening conditions, and seek emergency medical treatment and/or call 911 if the patient deems either necessary.      Brandi [name] was seen today for chronic condition follow-up.    Diagnoses and all orders for this visit:    Multifocal choroiditis of right eye  -     CBC w/ Differential; Future  -     Comprehensive Metabolic Panel; Future  -     CBC w/ Differential  -     Comprehensive Metabolic Panel    Medication monitoring encounter  -     CBC w/ Differential; Future  -     Comprehensive Metabolic Panel; Future  -     CBC w/ Differential  -     Comprehensive Metabolic Panel      Return in about 6 months (around 11/29/2024) for Video Visit.    Follow up in clinic in 6 months or sooner if needed. Pt plan was discussed with my attending Dr. Stacia.    I appreciate the opportunity to participate and collaborate in the care of this patient.  Brandi Georges, MD  PGY4 Rheumatology Fellow  Department of Medicine/Division of Rheumatology  [facility]     History of Present Illness:     Primary Care Provider: Connell Archie Atkinson, MD    HPI:  Brandi [name] is a 58 y.o.  female with PMH of macular edema, glaucoma, cataract of left eye, seasonal allergies, multifocal choroiditis of right eye here for follow up.     Background:   Patient reports she was initially diagnosed with multifocal choroiditis in 2011.  She has had an extensive workup including infectious and autoimmune testing in the past which was negative. She has been treated at [facility] with intra-articular steroids. She has developed glaucoma after multiple years of steroid use and is s/p surgery for glaucoma.  She follows with Dr. Shirley for multifocal chorditis.  Previously received Xipere steroid injection and received Yutiq steroid injection prior to her glaucoma surgery.  Dr. Shirley referred her to rheumatology for consideration of starting immunosuppression.  At her initial visit with rheumatology in June 2024 she was started on Cellcept  which was changed to Myfortic  due to GI side effects.    Last seen: March 2025    Interval history:   Pt reports she is doing well, vision is improving and she is able to read some lines on vision test that she was unable to do before. 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 Sees Dr. Shirley often and has been recovering well that the visits are starting to be spaced out more which she appreciates since she has been coming to [facility] so frequently for all of her follow ups.     Has not had side effects or other issues on myfortic , taking 2 tabs twice a day. No recent infections or illnesses. No new symptoms such as nasal/oral ulcers, hair loss, unexplained rash, fever/chills, joint pain.     Balance of 10 systems was reviewed and is negative except for that mentioned in the HPI.    Review of records: I have reviewed labs/images/clinic notes per the computerized medical record.       Allergies:  Nickel and Shellfish containing products    Medications:     Current Outpatient Medications:     acetaminophen (TYLENOL) 325 MG tablet, Take by mouth., Disp: , Rfl:     fluticasone propionate (FLONASE) 50 mcg/actuation nasal spray, into each nostril., Disp: , Rfl:     loratadine (CLARITIN) 10 mg tablet, Take 1 tablet (10 mg total) by mouth daily., Disp: , Rfl:     mycophenolate  (MYFORTIC ) 360 MG TbEC, Take 2 tablets (720 mg total) by mouth two (2) times a day., Disp: 360 tablet, Rfl: 1    prednisoLONE  acetate (PRED FORTE ) 1 % ophthalmic suspension, Administer 1 drop to the right eye every four (4) hours., Disp: 15 mL, Rfl: 1    prednisoLONE  acetate (PRED FORTE ) 1 % ophthalmic suspension, Administer 1 drop to the right eye four (4) times a day., Disp: 10 mL, Rfl: 5    predniSONE  (DELTASONE ) 20 MG tablet, TAKE 1 TABLET BY MOUTH TWICE DAILY FOR 5 DAYS, Disp: , Rfl:     [Paused] gabapentin  (NEURONTIN ) 300 MG capsule, Take 1 capsule (300 mg total) by mouth Three (3) times a day. (Patient taking differently: Take 1 capsule (300 mg total) by mouth once as needed.), Disp: 90 capsule, Rfl: 0    Current Facility-Administered Medications:     fluocinolone (YUTIQ) intravitreal implant 0.18 mg, 0.18 mg, Intravitreal, Once, Willett,  Keirnan L, MD    Facility-Administered Medications Ordered in Other Visits:     [EXPIRED] triamcinolone acetonide (PF) (XIPERE) injection 4 mg, 4 mg, suprachoroidal, , Willett, Keirnan L, MD, 4 mg at 10/16/21 9072    Medical History:  Past Medical History:   Diagnosis Date    Allergic rhinitis     Amblyopia Na    After glaucoma surgery    Anisocoria     Breast cancer    (CMS-HCC)     Cataract     Left eye    CME (cystoid macular edema)     Dry eyes Na    Eczema     Onset age 69 to about age 57 was treated w/systemic steroids    Eye trauma     GERD (gastroesophageal reflux disease)     Headache     Multifocal choroiditis of right eye     Pseudophakia, right eye     PVD (posterior vitreous detachment), bilateral     Steroid induced glaucoma, right eye        Surgical History:  Past Surgical History:   Procedure Laterality Date    BREAST LUMPECTOMY      CATARACT EXTRACTION Right     CESAREAN SECTION      x2    CORNEA SURGERY      EYE SURGERY      GLAUCOMA SURGERY Right     HYSTERECTOMY      LAPAROSCOPIC SUPRACERVICAL HYSTERECTOMY      PR CORNEAL TRANSPLANT,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    Procedure: DSEK KERATOPLASTY (CORNEAL TRANSPLANT); ENDOTHELIAL;  Surgeon: Ranjan, Sinthu Sivarama, MD;  Location: [facility];  Service: Ophthalmology    PR CORNEAL TRANSPLANT,PEN,PSEUDOAPHAK Right 12/04/2023    Procedure: KERATOPLASTY; PENETRATING (PSEUDOAPHAKIA);  Surgeon: Dawson Re, MD;  Location: [facility];  Service: Ophthalmology    PR ARDELIA GRATE CHAMBER,EYE,AIR/LIQUID Right 10/09/2022    Procedure: INJECTION, ANTERIOR CHAMBER OF EYE (SEP PROCEDURE); AIR OR LIQUID;  Surgeon: Ranjan, Sinthu Sivarama, MD;  Location: [facility] OR [facility];  Service: Ophthalmology    PR REVJ AQUEOUS SHUNT EXTRAOCULAR RESERVOIR W/GRAFT Right 09/27/2022    Procedure: REVIS AQUEOUS SHUNT-EXTRAOCULAR RESERVOIR;  Surgeon: Candise Lenis, MD;  Location: [facility] OR [facility];  Service: Ophthalmology       Social History:  Social History     Tobacco Use    Smoking status: Never    Smokeless tobacco: Never   Vaping Use    Vaping status: Never Used   Substance Use Topics    Alcohol use: No    Drug use: Never       Family History:  Family History   Problem Relation Age of Onset    Cancer Mother         PANCREATIC    Glaucoma Mother     Heart disease Father     No Known Problems Sister     No Known Problems Brother     Glaucoma Maternal Aunt     No Known Problems Maternal Uncle     No Known Problems Paternal Aunt     No Known Problems Paternal Uncle     Glaucoma Maternal Grandmother     No Known Problems Maternal Grandfather     No Known Problems Paternal Grandmother     No Known Problems Paternal Grandfather     No Known Problems Other     Amblyopia Neg Hx     Blindness Neg Hx     Cataracts Neg  Hx     Macular degeneration Neg Hx     Retinal detachment Neg Hx     Strabismus Neg Hx     Diabetes Neg Hx     Hypertension Neg Hx     Stroke Neg Hx     Thyroid disease Neg Hx            Objective   There were no vitals filed for this visit.      Physical Exam:  General: Well-appearing.   Speaking in full sentences  Rest of exam limited as this was a telemedicine appointment     Test Results  No results found for: ANA, PAT1, ANATITER1, PAT2, ANATITER2, PAT3, ANATITER3, PAT4, ANATITER4    No results found for: C3, C4, CKTOTAL, CRP, ESR, LDH, DSDNAAB, RF    No results found for: CREATUR, PROTEINUR, PCRATIOUR    No results found for: EJO1, ERNP, ESCL, ESMA, ESSA, ESSB    Lab Results   Component Value Date    HBSAG Nonreactive 02/25/2023    QFTTBGOLD Negative 02/25/2023    HEPCAB Nonreactive 02/25/2023       Lab Results   Component Value Date    WBC 8.2 11/22/2023       Lab Results   Component Value Date    ANCAIFA Negative 02/25/2023    MPO Negative 02/25/2023    MPOQT 3.0 02/25/2023

## 2024-06-01 ENCOUNTER — Encounter: Admit: 2024-06-01 | Discharge: 2024-06-02 | Payer: PRIVATE HEALTH INSURANCE

## 2024-06-01 DIAGNOSIS — Z5181 Encounter for therapeutic drug level monitoring: Principal | ICD-10-CM

## 2024-06-01 DIAGNOSIS — H30891 Other chorioretinal inflammations, right eye: Principal | ICD-10-CM

## 2024-06-01 NOTE — Unmapped (Addendum)
 Dear Ms. Wert,     It was a pleasure to see you today.     In clinic we discussed the following:      Continue the myfortic  at the current dose.      Please know you can reach out to me if you have any issues getting a medication (availability or cost) that I prescribe.     If you have non-urgent questions, the best way to contact us  is to send a MyChart message by visiting [email or web address].  You can also use MyChart to request refills and access test results.  I will do my best to respond within 2 business days, however occasionally it may take longer. If you have immediate concerns, please contact our clinic by phone [phone].      Have a wonderful day!      Mardeen Georges, MD  [facility]  [address]  [place], [place] [address]  Phone: [phone]  Fax: [phone]

## 2024-06-02 MED ORDER — PREDNISOLONE ACETATE 1 % EYE DROPS,SUSPENSION
0 refills | 0.00000 days
Start: 2024-06-02 — End: ?

## 2024-06-03 DIAGNOSIS — H30891 Other chorioretinal inflammations, right eye: Principal | ICD-10-CM

## 2024-06-03 LAB — CBC W/ DIFFERENTIAL
BANDED NEUTROPHILS ABSOLUTE COUNT: 0 x10E3/uL (ref 0.0–0.1)
BASOPHILS ABSOLUTE COUNT: 0 x10E3/uL (ref 0.0–0.2)
BASOPHILS RELATIVE PERCENT: 1 %
EOSINOPHILS ABSOLUTE COUNT: 0.1 x10E3/uL (ref 0.0–0.4)
EOSINOPHILS RELATIVE PERCENT: 2 %
HEMATOCRIT: 42 % (ref 34.0–46.6)
HEMOGLOBIN: 12.9 g/dL (ref 11.1–15.9)
IMMATURE GRANULOCYTES: 0 %
LYMPHOCYTES ABSOLUTE COUNT: 1.8 x10E3/uL (ref 0.7–3.1)
LYMPHOCYTES RELATIVE PERCENT: 26 %
MEAN CORPUSCULAR HEMOGLOBIN CONC: 30.7 g/dL — ABNORMAL LOW (ref 31.5–35.7)
MEAN CORPUSCULAR HEMOGLOBIN: 27.4 pg (ref 26.6–33.0)
MEAN CORPUSCULAR VOLUME: 89 fL (ref 79–97)
MONOCYTES ABSOLUTE COUNT: 0.6 x10E3/uL (ref 0.1–0.9)
MONOCYTES RELATIVE PERCENT: 8 %
NEUTROPHILS ABSOLUTE COUNT: 4.3 x10E3/uL (ref 1.4–7.0)
NEUTROPHILS RELATIVE PERCENT: 63 %
PLATELET COUNT: 181 x10E3/uL (ref 150–450)
RED BLOOD CELL COUNT: 4.71 x10E6/uL (ref 3.77–5.28)
RED CELL DISTRIBUTION WIDTH: 14 % (ref 11.7–15.4)
WHITE BLOOD CELL COUNT: 6.9 x10E3/uL (ref 3.4–10.8)

## 2024-06-03 LAB — COMPREHENSIVE METABOLIC PANEL
ALBUMIN: 4.5 g/dL (ref 3.8–4.9)
ALKALINE PHOSPHATASE: 106 IU/L (ref 49–135)
ALT (SGPT): 12 IU/L (ref 0–32)
AST (SGOT): 16 IU/L (ref 0–40)
BILIRUBIN TOTAL (MG/DL) IN SER/PLAS: 0.5 mg/dL (ref 0.0–1.2)
BLOOD UREA NITROGEN: 15 mg/dL (ref 6–24)
BUN / CREAT RATIO: 20 (ref 9–23)
CALCIUM: 9.5 mg/dL (ref 8.7–10.2)
CHLORIDE: 102 mmol/L (ref 96–106)
CO2: 26 mmol/L (ref 20–29)
CREATININE: 0.76 mg/dL (ref 0.57–1.00)
EGFR: 92 mL/min/1.73
GLOBULIN, TOTAL: 2.6 g/dL (ref 1.5–4.5)
GLUCOSE: 84 mg/dL (ref 70–99)
POTASSIUM: 4.1 mmol/L (ref 3.5–5.2)
SODIUM: 142 mmol/L (ref 134–144)
TOTAL PROTEIN: 7.1 g/dL (ref 6.0–8.5)

## 2024-06-03 MED ORDER — PREDNISOLONE ACETATE 1 % EYE DROPS,SUSPENSION
Freq: Three times a day (TID) | OPHTHALMIC | 3 refills | 67.00000 days | Status: CP
Start: 2024-06-03 — End: ?

## 2024-06-03 MED ORDER — MYCOPHENOLATE SODIUM 360 MG TABLET,DELAYED RELEASE
ORAL_TABLET | Freq: Two times a day (BID) | ORAL | 1 refills | 90.00000 days | Status: CP
Start: 2024-06-03 — End: 2024-06-03
  Filled 2024-06-16: qty 120, 30d supply, fill #0

## 2024-06-08 NOTE — Unmapped (Signed)
 I saw and evaluated the patient, participating in the key portions of the service.  I reviewed the resident???s note.  I agree with the resident???s findings and plan. Ezekiel Slocumb, MD

## 2024-06-11 NOTE — Unmapped (Signed)
 [facility] Refill Coordination Note    Brandi [name], DOB: March 24, 1966  Phone: [phone] (home)       All above HIPAA information was verified with patient.         06/08/2024     6:30 PM   Specialty Rx Medication Refill Questionnaire   Which Medications would you like refilled and shipped? Myfortic    Please list all current allergies: Seafood and nickel   Have you missed any doses in the last 30 days? No   Have you had any changes to your medication(s) since your last refill? No   How much of each medication do you have remaining at home? (eg. number of tablets, injections, etc.) 54   Have you experienced any side effects in the last 30 days? No   Please enter the full address (street address, city, state, zip code) where you would like your medication(s) to be delivered to. [address]                [place] [address]   Please specify on which day you would like your medication(s) to arrive. Note: if you need your medication(s) within 3 days, please call the pharmacy to schedule your order at [phone]  06/17/2024   Has your insurance changed since your last refill? No   Would you like a pharmacist to call you to discuss your medication(s)? No   Do you require a signature for your package? (Note: if we are billing Medicare Part B or your order contains a controlled substance, we will require a signature) No   I have been provided my out of pocket cost for my medication and approve the pharmacy to charge the amount to my credit card on file. Yes         Completed refill call assessment today to schedule patient's medication shipment from the [facility] [phone]).  All relevant notes have been reviewed.       Confirmed patient received a Conservation officer, historic buildings and a Surveyor, mining with first shipment. The patient will receive a drug information handout for each medication shipped and additional FDA Medication Guides as required. REFERRAL TO PHARMACIST     Referral to the pharmacist: Not needed      [facility]     Shipping address confirmed in Epic.     Delivery Scheduled: Yes, Expected medication delivery date: 10.15.25.     Medication will be delivered via Next Day Courier to the prescription address in Epic WAM.    Brandi [name]   [facility] Specialty Technician

## 2024-07-03 ENCOUNTER — Ambulatory Visit (INDEPENDENT_AMBULATORY_CARE_PROVIDER_SITE_OTHER)

## 2024-07-03 VITALS — BP 104/72 | HR 66 | Resp 16 | Ht 60.0 in | Wt 151.0 lb

## 2024-07-03 DIAGNOSIS — E782 Mixed hyperlipidemia: Secondary | ICD-10-CM | POA: Insufficient documentation

## 2024-07-03 DIAGNOSIS — K5900 Constipation, unspecified: Secondary | ICD-10-CM | POA: Diagnosis not present

## 2024-07-03 DIAGNOSIS — H30891 Other chorioretinal inflammations, right eye: Secondary | ICD-10-CM | POA: Diagnosis not present

## 2024-07-03 DIAGNOSIS — R030 Elevated blood-pressure reading, without diagnosis of hypertension: Secondary | ICD-10-CM | POA: Insufficient documentation

## 2024-07-03 DIAGNOSIS — E559 Vitamin D deficiency, unspecified: Secondary | ICD-10-CM | POA: Insufficient documentation

## 2024-07-03 DIAGNOSIS — H6990 Unspecified Eustachian tube disorder, unspecified ear: Secondary | ICD-10-CM | POA: Insufficient documentation

## 2024-07-03 NOTE — Progress Notes (Signed)
 New patient visit   Patient: Victoria Greene   DOB: Jan 25, 1966   58 y.o. Female  MRN: [id number] Visit Date: 07/03/2024  Today's healthcare provider: Isaiah DELENA Pepper, MD   Chief Complaint  Patient presents with   New Patient (Initial Visit)    NP/BP/Est Care   Subjective    Victoria Greene is a 58 y.o. female who presents today as a new patient to establish care.   Discussed the use of AI scribe software for clinical note transcription with the patient, who gave verbal consent to proceed.  History of Present Illness Victoria Greene is a 58 year old female who presents with concerns about elevated blood pressure and constipation.  She is concerned about elevated blood pressure, which was recorded at 148 mmHg during a wellness exam required by her employer, [employer]. She reports never having had issues with blood pressure before. She has been consuming oatmeal frequently in an attempt to manage her blood pressure.  She has a history of cornea transplant and is currently on mycophenolate and steroid eye drops. She experiences a dry cough, which she associates with the mycophenolate, and hopes to discontinue it by the beginning of the year.  She describes chronic constipation, stating she can go a week without a bowel movement unless she takes a laxative. She uses [employer]'s brand of Dulcolax weekly and has tried Miralax without success. She experiences bloating and gas despite consuming high-fiber foods like prunes and raisin bran.  Her diet includes chicken, vegetables, salads, beans, cauliflower rice, and yogurt. She avoids alcohol and smoking, and has been eating oatmeal twice daily recently. She mentions a past issue with high cholesterol and has been advised to eat more soluble fiber. She walks regularly, but cannot lift weights at the moment due to her recent eye surgery.  She recently had an ear infection with no pain but significant hearing loss, for which she was prescribed  antibiotics. She experiences eustachian tube dysfunction, particularly in winter, and uses a saline nasal spray and Flonase for management.   Past Medical History:  Diagnosis Date   Breast cancer (HCC) 2010   Right breast cancer - radiation   GERD (gastroesophageal reflux disease)    H/O   Glaucoma    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   Past Surgical History:  Procedure Laterality Date   BREAST BIOPSY Left 08/09/2020   stereo bx of calcs, x marker, path pending   BREAST BIOPSY Right 03/28/2022   stereo biopsy/ ribbon clip/ benign   BREAST BIOPSY Right 10/17/2023   Stereo bx, Coil clip, path pending   BREAST BIOPSY Right 10/17/2023   MM RT BREAST BX W LOC DEV 1ST LESION IMAGE BX SPEC STEREO GUIDE 10/17/2023 [facility]   BREAST EXCISIONAL BIOPSY Left 2010   benign   BREAST EXCISIONAL BIOPSY Right 2010   DCIS   BREAST LUMPECTOMY Right 2010   DCIS   BREAST LUMPECTOMY     CESAREAN SECTION     COLONOSCOPY WITH PROPOFOL  N/A 05/26/2018   Procedure: COLONOSCOPY WITH PROPOFOL ;  Surgeon: Unk Corinn Skiff, MD;  Location: [facility] ENDOSCOPY;  Service: Gastroenterology;  Laterality: N/A;   COLONOSCOPY WITH PROPOFOL  N/A 02/28/2022   Procedure: COLONOSCOPY WITH PROPOFOL ;  Surgeon: Unk Corinn Skiff, MD;  Location: [facility] ENDOSCOPY;  Service: Gastroenterology;  Laterality: N/A;   ESOPHAGOGASTRODUODENOSCOPY (EGD) WITH PROPOFOL  N/A 05/26/2018   Procedure: ESOPHAGOGASTRODUODENOSCOPY (EGD) WITH PROPOFOL ;  Surgeon: Unk Corinn Skiff, MD;  Location: [facility]  ENDOSCOPY;  Service: Gastroenterology;  Laterality: N/A;   EYE SURGERY  20013   GLAUCOMA TUBE PLACED   LAPAROSCOPIC BILATERAL SALPINGO OOPHERECTOMY Bilateral 06/23/2018   Procedure: LAPAROSCOPIC BILATERAL SALPINGO OOPHORECTOMY;  Surgeon: Kathe Gladis LABOR, MD;  Location: [facility] ORS;  Service: Gynecology;  Laterality: Bilateral;   LAPAROSCOPIC SUPRACERVICAL HYSTERECTOMY  2009   Family Status  Relation Name  Status   Mat Aunt  (Not Specified)   Mat Aunt  (Not Specified)   Father  Alive   Mother  Deceased   Neg Hx  (Not Specified)  No partnership data on file   Family History  Problem Relation Age of Onset   Breast cancer Maternal Aunt 38   Breast cancer Maternal Aunt 46   Heart disease Father    Thyroid disease Mother    Pancreatic cancer Mother    Ovarian cancer Neg Hx    Colon cancer Neg Hx    Diabetes Neg Hx    Social History   Socioeconomic History   Marital status: Divorced    Spouse name: Not on file   Number of children: Not on file   Years of education: Not on file   Highest education level: Some college, no degree  Occupational History   Not on file  Tobacco Use   Smoking status: Never   Smokeless tobacco: Never  Vaping Use   Vaping status: Never Used  Substance and Sexual Activity   Alcohol use: Not Currently    Comment: occas   Drug use: No   Sexual activity: Yes    Birth control/protection: None  Other Topics Concern   Not on file  Social History Narrative   Not on file   Social Drivers of Health   Financial Resource Strain: Medium Risk (07/03/2024)   Overall Financial Resource Strain (CARDIA)    Difficulty of Paying Living Expenses: Somewhat hard  Food Insecurity: Food Insecurity Present (07/03/2024)   Hunger Vital Sign    Worried About Running Out of Food in the Last Year: Sometimes true    Ran Out of Food in the Last Year: Never true  Transportation Needs: No Transportation Needs (07/03/2024)   PRAPARE - Administrator, Civil Service (Medical): No    Lack of Transportation (Non-Medical): No  Physical Activity: Inactive (07/03/2024)   Exercise Vital Sign    Days of Exercise per Week: 0 days    Minutes of Exercise per Session: Not on file  Stress: No Stress Concern Present (07/03/2024)   [employer] of Occupational Health - Occupational Stress Questionnaire    Feeling of Stress: Only a little  Social Connections: Socially  Isolated (07/03/2024)   Social Connection and Isolation Panel    Frequency of Communication with Friends and Family: More than three times a week    Frequency of Social Gatherings with Friends and Family: Twice a week    Attends Religious Services: Patient declined    Database Administrator or Organizations: No    Attends Engineer, Structural: Not on file    Marital Status: Divorced   Outpatient Medications Prior to Visit  Medication Sig   acetaminophen (TYLENOL) 500 MG tablet Take 1,000 mg by mouth every 6 (six) hours as needed for moderate pain or headache.   cefdinir (OMNICEF) 300 MG capsule Take 300 mg by mouth 2 (two) times daily.   fluticasone (FLONASE) 50 MCG/ACT nasal spray Place 2 sprays into both nostrils at bedtime as needed for allergies.    loratadine (CLARITIN)  10 MG tablet Take 10 mg by mouth daily.   mycophenolate (MYFORTIC) 360 MG TBEC EC tablet Take 360 mg by mouth 2 (two) times daily.   [DISCONTINUED] azelastine (ASTELIN) 0.1 % nasal spray Place 1 spray into both nostrils as needed for allergies.   prednisoLONE acetate (PRED FORTE) 1 % ophthalmic suspension Place 1 drop into the right eye 4 (four) times daily.   [DISCONTINUED] FIBER ADULT GUMMIES PO Take 3 each by mouth daily.   [DISCONTINUED] Fluocinolone Acetonide 0.18 MG IMPL by Intravitreal route.   [DISCONTINUED] Multiple Vitamin (MULTIVITAMIN) capsule Take 1 capsule by mouth daily.   No facility-administered medications prior to visit.   Allergies  Allergen Reactions   Nickel Rash   Shellfish Allergy Rash   Shellfish Protein-Containing Drug Products Rash    Reviews of Systems as noted in HPI.      Objective    BP 104/72 (BP Location: Left Arm, Patient Position: Sitting, Cuff Size: Normal)   Pulse 66   Resp 16   Ht 5' (1.524 m)   Wt 151 lb (68.5 kg)   SpO2 100%   BMI 29.49 kg/m     Physical Exam Constitutional:      Appearance: Normal appearance.  HENT:     Head: Normocephalic and  atraumatic.     Right Ear: A middle ear effusion is present. Tympanic membrane is erythematous. Tympanic membrane is not bulging.     Mouth/Throat:     Mouth: Mucous membranes are moist.  Eyes:     Pupils: Pupils are equal, round, and reactive to light.  Cardiovascular:     Rate and Rhythm: Normal rate and regular rhythm.     Heart sounds: Normal heart sounds.  Pulmonary:     Effort: Pulmonary effort is normal.     Breath sounds: Normal breath sounds.  Skin:    General: Skin is warm.  Neurological:     General: No focal deficit present.     Mental Status: She is alert.     Depression Screen    10/22/2023    2:24 PM 09/05/2022    8:19 AM 08/31/2021    8:13 AM  PHQ 2/9 Scores  PHQ - 2 Score 0 1 0   No results found for any visits on 07/03/24.  Assessment & Plan      Problem List Items Addressed This Visit       Nervous and Auditory   Dysfunction of eustachian tube     Other   Multifocal choroiditis of right eye   Constipation - Primary   Vitamin D  deficiency   Mixed hyperlipidemia   Blood pressure elevated without history of HTN   Assessment & Plan Elevated blood pressure reading Patient told to be seen by primary care doctor due to elevated BP at work. Blood pressure well-controlled today in clinic. Patient eats a healthy diet and regularly exercises. - Continue current diet and exercise regimen. - Reassure regarding blood pressure control.  BP Readings from Last 3 Encounters:  07/03/24 104/72  10/22/23 118/84  09/05/22 125/86    Multifocal choroiditis of R eye, s/p corneal transplant Patient with hx of recent corneal transplant, taking mycophenolate and steroid eye drops. Follows with ophthalmology at [facility]. - Continue mycophenolate and steroid eye drops as prescribed. - Follow up with ophthalmology  Chronic constipation Patient with infrequent bowel movements. Dulcolax effective, Miralax ineffective once daily. Discussed increasing Miralax dosage. -  Increase Miralax to up to three times daily as needed to achieve one  soft bowel movement per day. - Continue Dulcolax as needed.  Eustachian tube dysfunction Recurrent right ear symptoms likely due to eustachian tube dysfunction, exacerbated by allergies and colds. Recent improvement with antibiotics for ear infection. - Continue Flonase nasal spray. - Schedule follow-up visit if ear pain recurs.  Hyperlipidemia Patient with mildly elevated cholesterol levels.  - Discussed eating a balanced diet and incorporating movement into daily routine.   Vitamin D  deficiency Vitamin D  deficiency, taking vitamin D3 supplements as prescribed. - Continue vitamin D3 supplementation.   I reviewed 3+ prior external notes from unique sources, including ophthalmology, rheumatology, OBGYN, and previous internal medicine documentation.   Return in about 1 year (around 07/03/2025) for Annual Physical Exam.      Isaiah DELENA Pepper, MD  [facility] [phone] (phone) [phone] (fax)

## 2024-07-03 NOTE — Patient Instructions (Addendum)
 I value your feedback, so if you receive a survey, please take the time to fill it out. Thank you for choosing [facility]!   I recommend taking Miralax (1 capful in 8oz of water) up to three times daily to target 1-2 soft bowel movements.  I also recommend increasing your fluid intake and fiber in your diet. Good sources of fiber include vegetables, fruits, beans, nuts, and whole grains.

## 2024-07-13 NOTE — Progress Notes (Signed)
 [facility] Specialty and Home Delivery Pharmacy Refill Coordination Note    Brandi [name], DOB: [birth year]  Phone: [phone] (home)       All above HIPAA information was verified with patient.         07/08/2024     5:48 PM   Specialty Rx Medication Refill Questionnaire   Which Medications would you like refilled and shipped? Myfortic    Please list all current allergies: Nickel  and seafood   Have you missed any doses in the last 30 days? Yes   If Yes, how many doses have you missed ? 7   Have you had any changes to your medication(s) since your last refill? Yes   Please list your medication(s) changes below. Antibiotics for ear infections,  finished the full dose 11/5   How much of each medication do you have remaining at home? (eg. number of tablets, injections, etc.) 88 pills   Have you experienced any side effects in the last 30 days? No   Please enter the full address (street address, city, state, zip code) where you would like your medication(s) to be delivered to. [address] , [place] [address]   Please specify on which day you would like your medication(s) to arrive. Note: if you need your medication(s) within 3 days, please call the pharmacy to schedule your order at [phone]  07/24/2024   Has your insurance changed since your last refill? No   Would you like a pharmacist to call you to discuss your medication(s)? No   Do you require a signature for your package? (Note: if we are billing Medicare Part B or your order contains a controlled substance, we will require a signature) No   I have been provided my out of pocket cost for my medication and approve the pharmacy to charge the amount to my credit card on file. Yes         Completed refill call assessment today to schedule patient's medication shipment from the [facility] Delivery Pharm[facility] [phone]).  All relevant notes have been reviewed.       Confirmed patient received a Conservation Officer, Historic Buildings and a Surveyor, Mining with first shipment. The patient will receive a drug information handout for each medication shipped and additional FDA Medication Guides as required.         REFERRAL TO PHARMACIST     Referral to the pharmacist: Yes - high priority compliance concerns. Patient has missed more than 3 doses of medication. Refills were scheduled and concern routed (high priority) to pharmacist for evaluation.      SHIPPING     Shipping address confirmed in Epic.     Delivery Scheduled: Yes, Expected medication delivery date: 11.21.25.     Medication will be delivered via Next Day Courier to the prescription address in Epic WAM.    [name]   [facility] Specialty and Home Delivery Pharmacy Specialty Technician

## 2024-07-13 NOTE — Progress Notes (Signed)
 This pharmacist was notified by a technician that this patient has reported that they've missed 7 doses of their Myfortic  due to being on an antibiotic.. I have reviewed the patient's medical record and have determined that no further pharmacist action is needed.      Approximate time spent: 0-5 minutes    [name], PharmD, Clinical Specialty Pharmacist  [facility] Specialty and Home Delivery Pharmacy

## 2024-07-23 MED FILL — MYCOPHENOLATE SODIUM 360 MG TABLET,DELAYED RELEASE: ORAL | 30 days supply | Qty: 120 | Fill #1

## 2024-08-17 DIAGNOSIS — T380X5A Adverse effect of glucocorticoids and synthetic analogues, initial encounter: Principal | ICD-10-CM

## 2024-08-17 DIAGNOSIS — H44111 Panuveitis, right eye: Principal | ICD-10-CM

## 2024-08-17 DIAGNOSIS — Z947 Corneal transplant status: Principal | ICD-10-CM

## 2024-08-17 DIAGNOSIS — H21521 Goniosynechiae, right eye: Principal | ICD-10-CM

## 2024-08-17 DIAGNOSIS — H30891 Other chorioretinal inflammations, right eye: Principal | ICD-10-CM

## 2024-08-17 DIAGNOSIS — H179 Unspecified corneal scar and opacity: Principal | ICD-10-CM

## 2024-08-17 DIAGNOSIS — H4061X0 Glaucoma secondary to drugs, right eye, stage unspecified: Principal | ICD-10-CM

## 2024-08-17 MED ORDER — PREDNISOLONE ACETATE 1 % EYE DROPS,SUSPENSION
Freq: Three times a day (TID) | OPHTHALMIC | 5 refills | 34.00000 days | Status: CP
Start: 2024-08-17 — End: 2025-02-13

## 2024-08-17 NOTE — Progress Notes (Signed)
 [facility] Specialty and Home Delivery Pharmacy Refill Coordination Note    Brandi [name], DOB: [birth year]  Phone: [phone] (home)       All above HIPAA information was verified with patient.         08/15/2024    10:39 PM   Specialty Rx Medication Refill Questionnaire   Which Medications would you like refilled and shipped? MYFORTIC    Please list all current allergies: Nickel and seafood   Have you missed any doses in the last 30 days? No   Have you had any changes to your medication(s) since your last refill? No   How much of each medication do you have remaining at home? (eg. number of tablets, injections, etc.) 56 tablets (2 weeks)   Have you experienced any side effects in the last 30 days? No   Please enter the full address (street address, city, state, zip code) where you would like your medication(s) to be delivered to. [address]  [place] [address]   Please specify on which day you would like your medication(s) to arrive. Note: if you need your medication(s) within 3 days, please call the pharmacy to schedule your order at [phone]  08/21/2024   Has your insurance changed since your last refill? No   Would you like a pharmacist to call you to discuss your medication(s)? No   Do you require a signature for your package? (Note: if we are billing Medicare Part B or your order contains a controlled substance, we will require a signature) No   I have been provided my out of pocket cost for my medication and approve the pharmacy to charge the amount to my credit card on file. Yes   Additional Comments: No out of pocket cost         Completed refill call assessment today to schedule patient's medication shipment from the [facility] Specialty and Home Delivery Pharmacy [phone]).  All relevant notes have been reviewed.       Confirmed patient received a Conservation Officer, Historic Buildings and a Surveyor, Mining with first shipment. The patient will receive a drug information handout for each medication shipped and additional FDA Medication Guides as required.         REFERRAL TO PHARMACIST     Referral to the pharmacist: Not needed      [facility]     Shipping address confirmed in Epic.     Delivery Scheduled: Yes, Expected medication delivery date: 12.19.25.     Medication will be delivered via Next Day Courier to the prescription address in Epic WAM.    [name]   [facility] Specialty and Home Delivery Pharmacy Specialty Technician

## 2024-08-17 NOTE — Progress Notes (Signed)
 [facility]    Timeline Summary  #Multifocal choroiditis right eye   #Steroid-induced glaucoma right eye  #Cystoid macular edema right eye                 Right    Left    Systemic    Date VA IOP Inf. Plan VA IOP Inf. Plan     08-15-1966          DOB    2004             10/25/2011    IOL, BGI         12/31/2012    IVK         03/27/2013   ERG           05/24/2017 20/80  8   20/20 12       05/29/2017 20/60    IVK 20/20        08/21/2017 20/60  9   20/25  13       09/12/2017 20/80  6  IVK 20/20 12       12/18/2017 20/40 8   20/25 14       01/17/2018 20/80   IVK 20/25 13       02/06/2018    IVK         03/07/2018 20/60  11   20/20  16       05/02/2018 HM 5  IVK 20/20 16       06/06/2018 20/70  10   20/20 12       08/14/2018 20/70  10  IVK 20/25 18       10/30/2018 20/100 9  IVK 20/20 18       01/15/2019 20/80  10  IVK 20/20  17       04/21/2019 20/80  7  OZD 20/20  20       04/28/2019 20/200 10   20/20 16       07/07/2019 20/400 9  OZD 20/20 12       09/09/2019 20/60  6  OZD 20/30  16       11/11/2019 20/80  5  OZD 20/25 15       01/13/2020 20/80 6  OZD 20/25  19       03/22/2020 20/200 7  OZD 20/25 10       05/24/2020 20/200 4  OZD 20/25  20       07/26/2020 20/200  7  OZD 20/20  20      KLW 09/28/2020 20/200 8 sl better cme OZD 8 20/20 16       11/04/2020  20/50 9 cme better 5 20/20 19       12/02/2020 20/60 3 cme worse OZD 9 20/20 16       01/20/2021  20/60  9 cme stable IVK 7   14       03/10/2021  20/60  8  CME improved   20/20  16      KLW 04/19/2021 20/70 5 CME sl worse SCT 12 20/20 12       06/16/2021 20/70 3 CME better  20/25 17       07/17/2021 20/80 5 worse SCT, 12         08/18/2021 20//70 5 better  20/20 15       10/16/2021 20/80 4 worse SCT, 12 16 20        12/25/2021 20/80 4  TC YUT  01/10/2022 20/100 3 SL worse  20/20 3       01/24/2022 20/200 4 Sl worse cIVT 20/20 16       02/21/2022 20/200 4 hazy  20/20 17       03/14/2022 20/150 4 Sl better  20/20 20       05/16/2022 20/150 6 Sl better  20/20 19       07/16/2022 09/05/2022 CF 6 K edema YUT 20/20 17       09/27/2022    DSEK + tube repos         10/09/2022     DSEK Rebub Sutures           12/17/2022 CF 6 K better  20/20 19       03/11/2023         Start MMF    04/12/2023 20/800 5 better  20/20 19   MMF    10/04/2023  HM 7 K worse  20/20 16   MMF    12/04/2023    PKP         12/11/2023 CF 14   20/20 11   MMF    04/13/2024   20/100 7   20/20 21           PA 3         08/17/2024 20/200 6  same 20/20 20   MMF                                 Initial History of present illness:   58 year old female, works for [employer], with complex history in the right eye with 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  She has been treated here at [facility] for many years at least since 2006 and has been in the last few years treated with intraocular steroids consistently including Ozurdex  every 2 months since August 2020.    She had a Baerveldt glaucoma tube placed in twenty thirteen and is followed by Dr. Barry for glaucoma.    The full-field ERG which was done in 2014 is somewhat difficult to interpret but appears to show both decreased voltage in rod and cone signals    A detailed questionnaire regarding medical history and review of symptoms relevant to uveitis was filled out by the patient, reviewed by the ophthalmologist and scanned into the electronic medical record.       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  Agree with plan to continue prednisolone  drops.  She has some intraretinal fluid despite Yutiq so could supplement with injection in the future but will observe for period of time given that this is borderline visually significant keeping in mind other ocular issues.  I agree with plan to continue mycophenolate       Return in about 4 months (around 12/16/2024) for OCT with EDI, optos color and faf, possible, Xipere, Right.      # Corneal edema right eye  S/p DSEK Rebubble + Anchoring Sutures Right Eye (10/09/22)  S/p DSEK + tube repositioning RIGHT EYE (Ranjan/Fleischman/Pascoe)(09/27/22)   - continues PF BID, following with Ranjan/Fleischman   12/04/2023 right eye PKP      #  Glaucoma, s/p tube OD,   IOP acceptable  Continue follow-up with Dr. Barry.    #  PVD  Stable  Pt instructed to return to clinic ASAP if new floaters, flashes or curtain/shadow occur.    #  pseudophakia right eye.   Stable  Observe    #Cataract left eye  Not visually significant  Observe     # eczema  age 86 to about 50 was active   treated with systemic steroids    INTERPRETATION EXTENDED OPHTHALMOSCOPY (90D lens)  Right eye - Macula:  decreased foveal reflex Periphery:  Normal  Left eye - Macula: Normal Periphery:Normal     Attestation:  I was present with resident during the history and exam.  I discussed the findings, assessment, and plan with the resident and agree with the findings and plan as documented in the resident's note. I reviewed any relevant imaging personally.     Wendy LITTIE Lucks, MD           Uveitis is a serious, complex condition requiring ongoing medical care service coordination with a uveitis specialist as the focal point. (G2211)

## 2024-08-20 MED FILL — MYCOPHENOLATE SODIUM 360 MG TABLET,DELAYED RELEASE: ORAL | 30 days supply | Qty: 120 | Fill #2

## 2024-09-14 NOTE — Progress Notes (Addendum)
 09/15/24--Filling brand name for cost savings.  MD aware and ok -PSD     [facility] Refill Coordination Note    1.12.26 - spoke w/ pt and ok'ed delivery on 1.23.26  + $42.70 + pt did not approve of copay       Brandi [name], DOB: 1966/06/07  Phone: [phone] (home)       All above HIPAA information was verified with patient.         09/12/2024     1:20 PM   Specialty Rx Medication Refill Questionnaire   Which Medications would you like refilled and shipped? Myfortic    Please list all current allergies: Nickel and seafood   Have you missed any doses in the last 30 days? No   Have you had any changes to your medication(s) since your last refill? No   How much of each medication do you have remaining at home? (eg. number of tablets, injections, etc.) 72   Have you experienced any side effects in the last 30 days? No   Please enter the full address (street address, city, state, zip code) where you would like your medication(s) to be delivered to. [address] , [place] [address]   Please specify on which day you would like your medication(s) to arrive. Note: if you need your medication(s) within 3 days, please call the pharmacy to schedule your order at [phone]  09/28/2024   Has your insurance changed since your last refill? No   Would you like a pharmacist to call you to discuss your medication(s)? No   Do you require a signature for your package? (Note: if we are billing Medicare Part B or your order contains a controlled substance, we will require a signature) No   I have been provided my out of pocket cost for my medication and approve the pharmacy to charge the amount to my credit card on file. Yes         Completed refill call assessment today to schedule patient's medication shipment from the [facility] [phone]).  All relevant notes have been reviewed.       Confirmed patient received a Conservation Officer, Historic Buildings and a Surveyor, Mining with first shipment. The patient will receive a drug information handout for each medication shipped and additional FDA Medication Guides as required.         REFERRAL TO PHARMACIST     Referral to the pharmacist: Not needed      [facility]     Shipping address confirmed in Epic.     Delivery Scheduled: Yes, Expected medication delivery date: 1.23.26.     Medication will be delivered via Next Day Courier to the prescription address in Epic WAM.    Brandi [name]   [facility] Specialty Technician

## 2024-09-15 ENCOUNTER — Ambulatory Visit

## 2024-09-18 DIAGNOSIS — H44111 Panuveitis, right eye: Principal | ICD-10-CM

## 2024-09-18 DIAGNOSIS — H30891 Other chorioretinal inflammations, right eye: Principal | ICD-10-CM

## 2024-09-18 DIAGNOSIS — Z947 Corneal transplant status: Principal | ICD-10-CM

## 2024-09-18 NOTE — Progress Notes (Signed)
 Cornea Service    Subjective   Patient ID: Brandi [name] is a 59 y.o. female.      HPI    A 59 y.o. female presents for history of Multifocal choroiditis of right eye    Current using Pred 3-4 right eye  Last edited by Carlyon Riggs on 09/18/2024  1:27 PM.        No current outpatient medications on file. (ANTI-ULCER PREPARATIONS)       Current Outpatient Medications (Other)   Medication Sig Dispense Refill    acetaminophen (TYLENOL) 325 MG tablet Take by mouth.      fluticasone propionate (FLONASE) 50 mcg/actuation nasal spray into each nostril.      loratadine (CLARITIN) 10 mg tablet Take 1 tablet (10 mg total) by mouth daily.      mycophenolate  (MYFORTIC ) 360 MG TbEC Take 2 tablets (720 mg total) by mouth two (2) times a day. 360 tablet 1    prednisoLONE  acetate (PRED FORTE ) 1 % ophthalmic suspension Administer 1 drop to the right eye Three (3) times a day. 5 mL 5    predniSONE  (DELTASONE ) 20 MG tablet TAKE 1 TABLET BY MOUTH TWICE DAILY FOR 5 DAYS         Objective   Base Eye Exam       Visual Acuity (Snellen - Linear)         Right Left    Dist cc 20/100 -2 20/20    Dist ph cc NI       Correction: Glasses              Tonometry (Icare, 1:22 PM)         Right Left    Pressure 5 17              Pupils         APD    Right None    Left None              Neuro/Psych       Oriented x3: Yes    Mood/Affect: Normal                  Refraction       Wearing Rx         Sphere Cylinder Axis    Right +2.00 +0.75 086    Left -0.75 +0.75 016                        No orders of the defined types were placed in this encounter.      Assessment/Plan:   Cornea Service    Subjective   Patient ID: Brandi [name] is a 59 y.o. female.        HPI    A 59 y.o. female presents for history of Multifocal choroiditis of right eye    Current using Pred 3-4 right eye  Last edited by Carlyon Riggs on 09/18/2024  1:27 PM.        No current outpatient medications on file. (ANTI-ULCER PREPARATIONS)       Current Outpatient Medications (Other) Medication Sig Dispense Refill    acetaminophen (TYLENOL) 325 MG tablet Take by mouth.      fluticasone propionate (FLONASE) 50 mcg/actuation nasal spray into each nostril.      loratadine (CLARITIN) 10 mg tablet Take 1 tablet (10 mg total) by mouth daily.      mycophenolate  (MYFORTIC ) 360 MG TbEC Take 2 tablets (720 mg  total) by mouth two (2) times a day. 360 tablet 1    prednisoLONE  acetate (PRED FORTE ) 1 % ophthalmic suspension Administer 1 drop to the right eye Three (3) times a day. 5 mL 5    predniSONE  (DELTASONE ) 20 MG tablet TAKE 1 TABLET BY MOUTH TWICE DAILY FOR 5 DAYS         Objective   Base Eye Exam       Visual Acuity (Snellen - Linear)         Right Left    Dist cc 20/100 -2 20/20    Dist ph cc NI       Correction: Glasses              Tonometry (Icare, 1:22 PM)         Right Left    Pressure 5 17              Pupils         APD    Right None    Left None              Neuro/Psych       Oriented x3: Yes    Mood/Affect: Normal                  Refraction       Wearing Rx         Sphere Cylinder Axis    Right +2.00 +0.75 086    Left -0.75 +0.75 016                Assessment/Plan:    Right eye: Pseudophakia  Clear graft  PAS  Controlled glaucoma    Plan: Continue PF 3 times day  PFAT    Patient is under follow up with Dr Zoe (is using Mycophenolate  720 mg/BD and Dr. Candise  Continue to follow with Rheumatology     I discussed the above assessment and plan with the patient. She had the opportunity to ask questions, and her questions and concerns were addressed. She was reminded to call if there is any significant change or worsening in vision, or to get an evaluation, urgently if appropriate.    Emery Peri MD,FICO

## 2024-09-21 ENCOUNTER — Other Ambulatory Visit: Payer: Self-pay

## 2024-09-21 DIAGNOSIS — Z1231 Encounter for screening mammogram for malignant neoplasm of breast: Secondary | ICD-10-CM

## 2024-09-24 MED FILL — MYFORTIC 360 MG TABLET,DELAYED RELEASE: ORAL | 30 days supply | Qty: 120 | Fill #3

## 2024-10-02 NOTE — Telephone Encounter (Signed)
 Spoke with patient - she has an appointment with Dr. Candise, will wait to see him- right eye has been itchy feeling like something in it and this morning its very red and puffy and slightly achey for a few days

## 2024-10-06 NOTE — Progress Notes (Signed)
#   Glaucoma, steroid-response, indeterminate stage, s/p tube shunt, OD  # Glaucoma suspect, OS    - S/p DSEK + tube repositioning RIGHT EYE 09/27/22   - RNFL 10/2024     OD: AT 103 (WNL) FULL [DA 1.74]      OS: AT 97 (wnl) FULL [DA 2.11] - stable     RNFL Symmetry 2%     - 10/06/24: IOP good. AC shallow with high PAS and I/K touch. Graft clear. Plate well covered. CPM.     -- Plan:  -- continue PF TID  -- continue with Dr. Soleimani as scheduled   -- continue with Dr. 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   Stable  Observe    #Cataract left eye  Not visually significant  Observe     I saw and evaluated the patient, participating in the key portions of the service.  I reviewed the resident???s note.  I agree with the resident???s findings and plan. ALM DANAS, MD, MS, FACS

## 2024-10-26 ENCOUNTER — Ambulatory Visit: Admitting: Obstetrics & Gynecology

## 2025-07-06 ENCOUNTER — Encounter
